# Patient Record
Sex: Male | Born: 1955 | Race: Black or African American | Hispanic: No | Marital: Married | Smoking: Former smoker
Health system: Southern US, Community
[De-identification: ages and names within clinical notes are randomized; demographics above are authoritative.]

## PROBLEM LIST (undated history)

## (undated) DIAGNOSIS — I10 Essential (primary) hypertension: Secondary | ICD-10-CM

---

## 2006-01-20 ENCOUNTER — Ambulatory Visit: Payer: Self-pay | Admitting: Unknown Physician Specialty

## 2007-01-23 ENCOUNTER — Ambulatory Visit: Payer: Self-pay | Admitting: Gastroenterology

## 2007-08-29 ENCOUNTER — Ambulatory Visit: Payer: Self-pay | Admitting: Family Medicine

## 2007-08-29 IMAGING — US US RENAL KIDNEY
1 series · 14 of 25 positions shown · non-contrast
Comparison: none

REASON FOR EXAM: Left flank pain, hematuria
COMMENTS:

[Series 1: us renal kidney · 0.33mm/px · 14 of 28 slices shown]
[im 1/28]
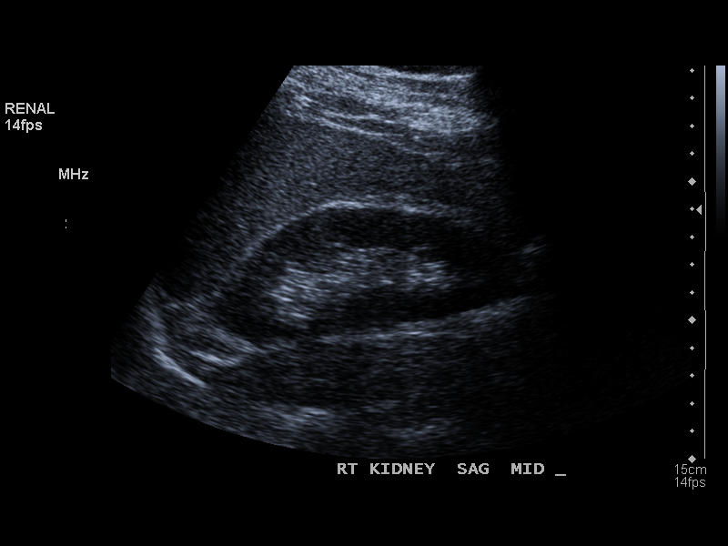
[im 3/28]
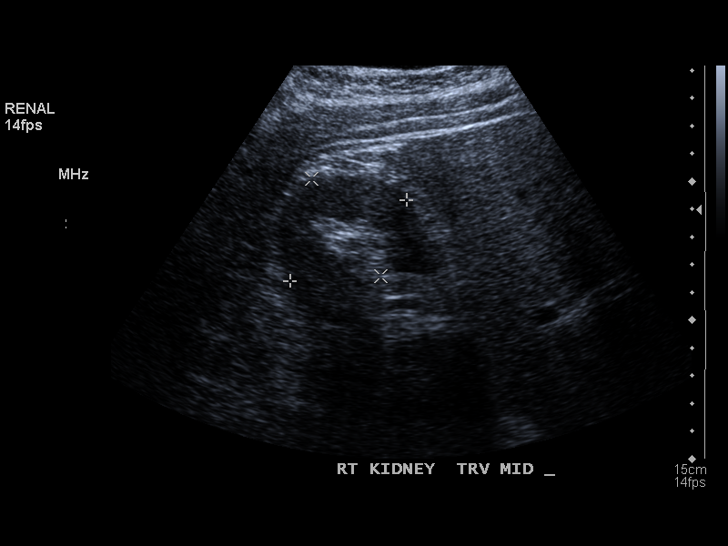
[im 5/28]
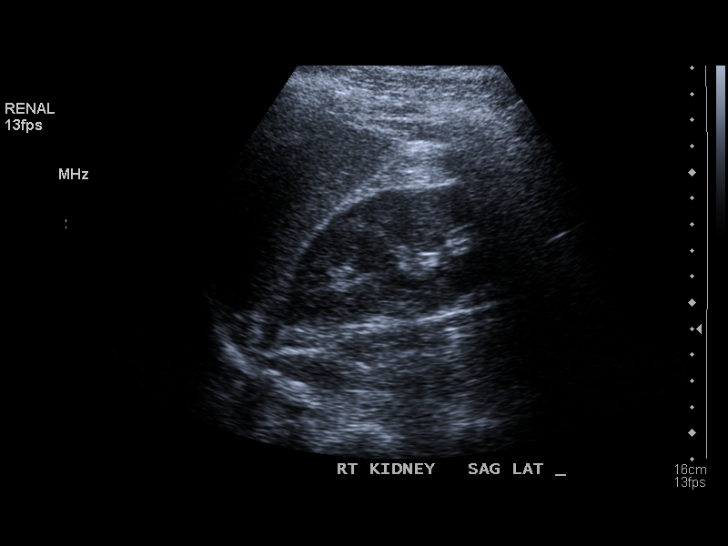
[im 7/28]
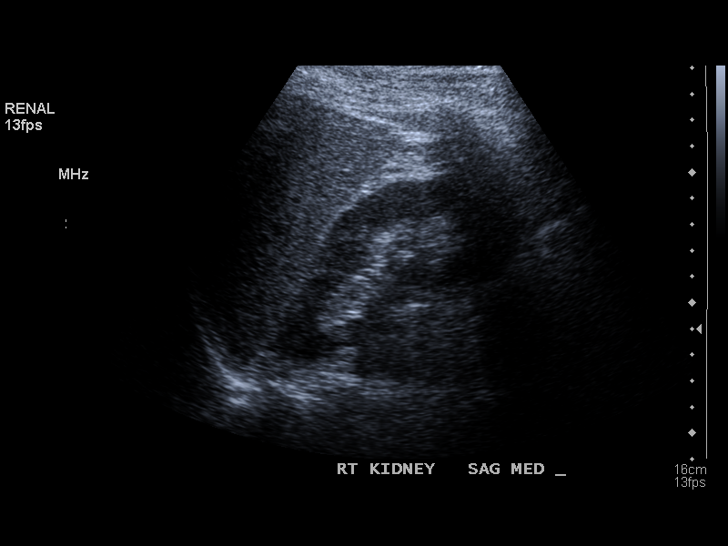
[im 10/28]
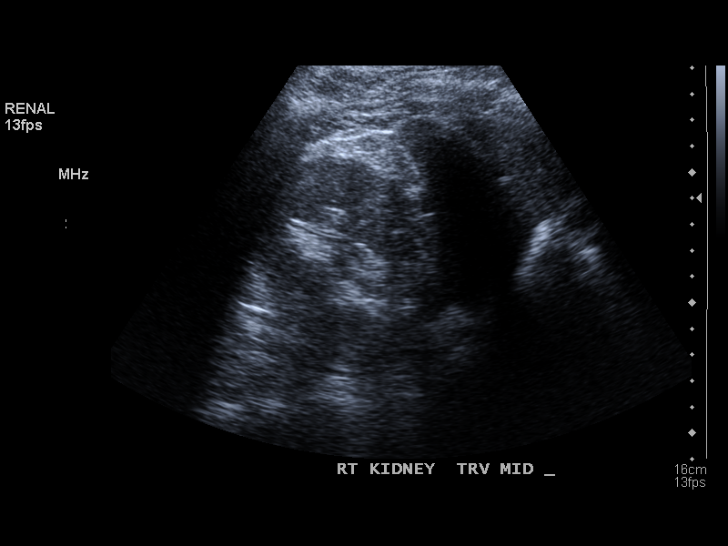
[im 11/28]
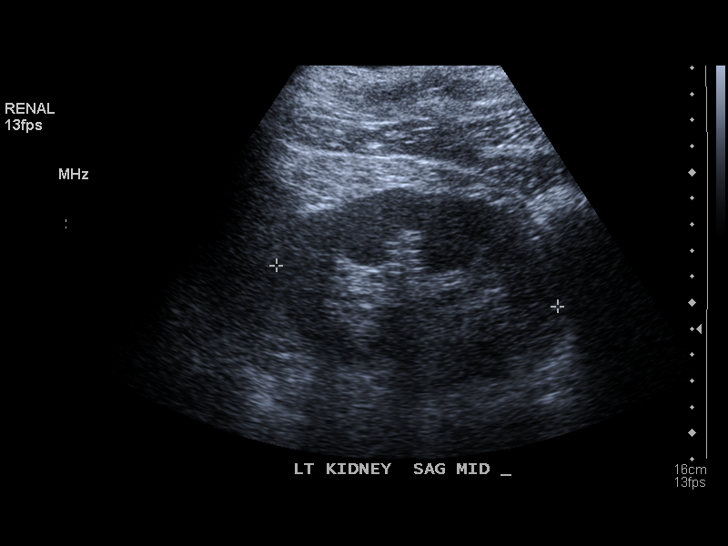
[im 13/28]
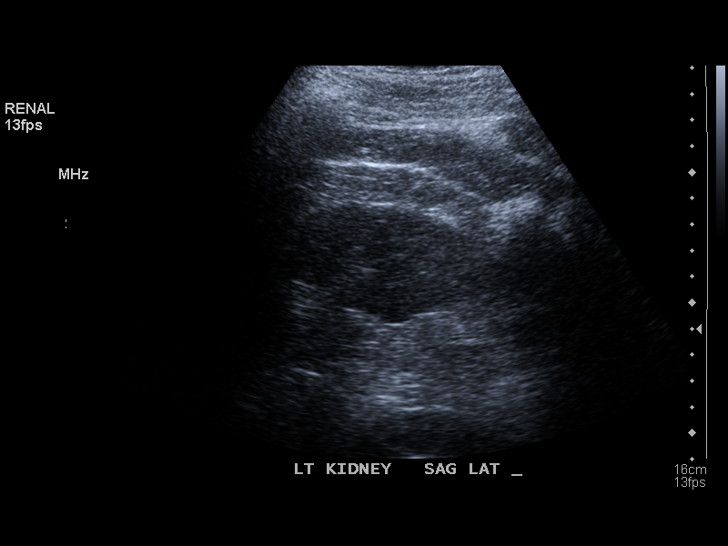
[im 15/28]
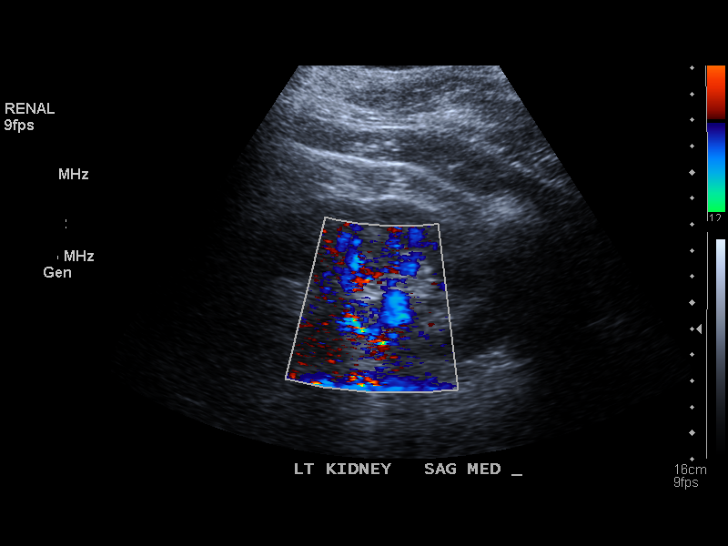
[im 17/28]
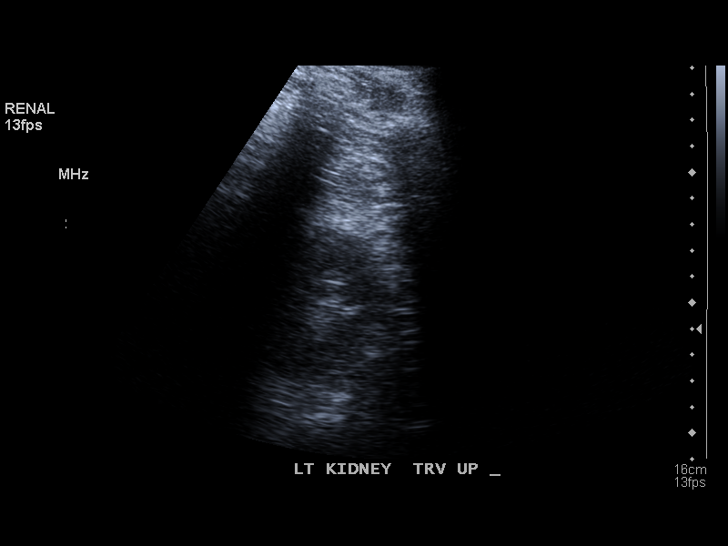
[im 19/28]
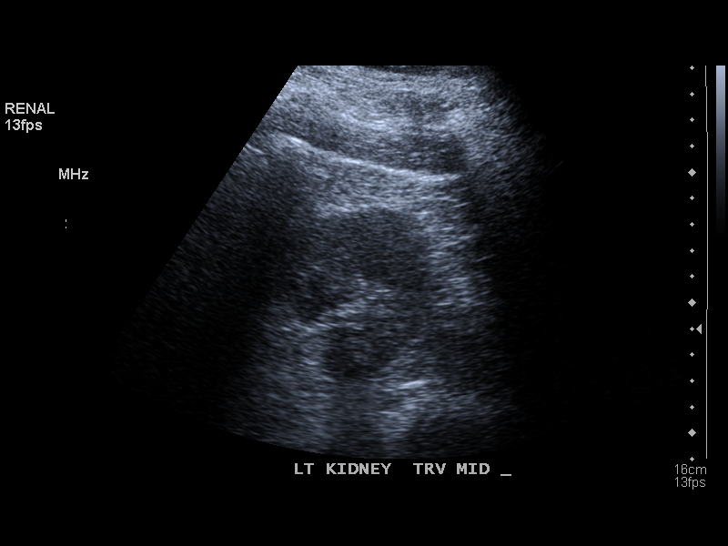
[im 21/28]
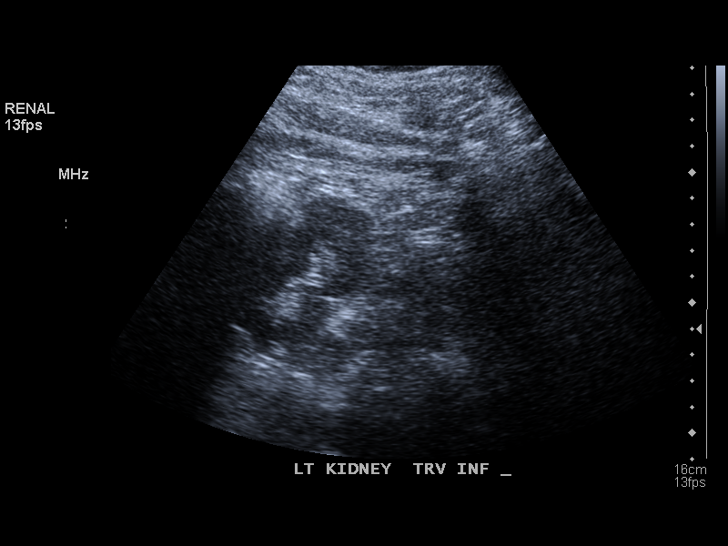
[im 23/28]
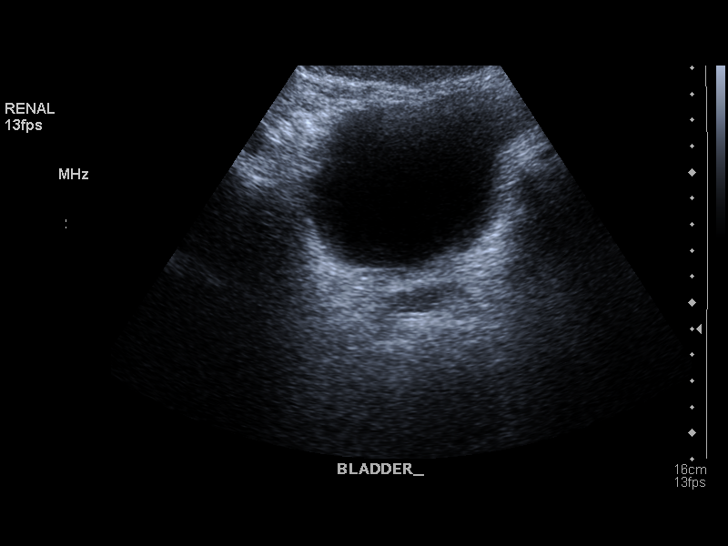
[im 25/28]
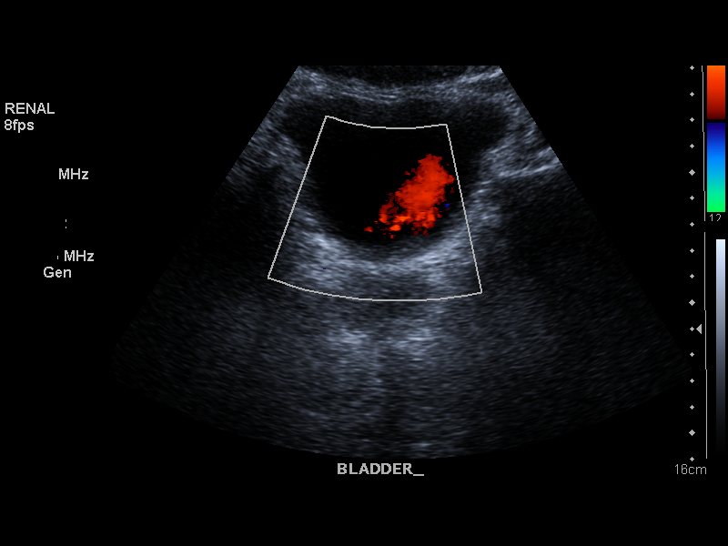
[im 28/28]
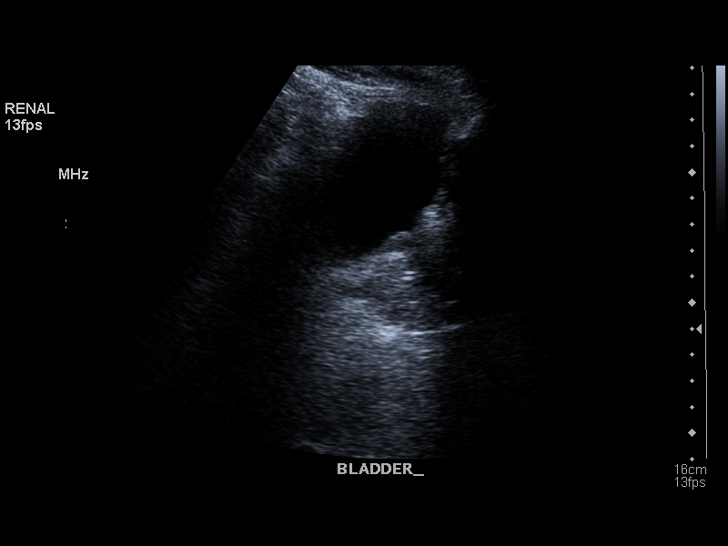

[14 of 25 positions shown; findings below may reference images not displayed]

PROCEDURE:     US  - US KIDNEY  - [DATE]  [DATE]

RESULT:     The RIGHT kidney measures 10.06 cm x 5.12 cm x 4.31 cm. The LEFT
kidney measures 10.9 cm x 4.77 cm x 4.77 cm. The renal cortical margins are
smooth. No solid renal mass lesions are seen. No renal calcifications are
identified. There is no hydronephrosis. The urinary bladder is normal in
appearance.
IMPRESSION: No significant abnormalities are noted.

## 2008-02-12 ENCOUNTER — Ambulatory Visit: Payer: Self-pay | Admitting: Gastroenterology

## 2011-02-24 ENCOUNTER — Inpatient Hospital Stay: Payer: Self-pay | Admitting: Internal Medicine

## 2011-02-24 IMAGING — NM NUCLEAR MEDICINE GASTROINTESTINAL BLEEDING STUDY
2 series · 8 of 8 positions shown · non-contrast
Comparison: none

REASON FOR EXAM: gI bleed, bright blood
COMMENTS:

[Series 1000: gi bleed · 4.80mm/px · 6 of 250 frames shown]
[frame 21/250]
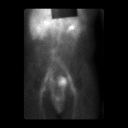
[frame 63/250]
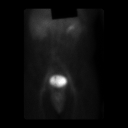
[frame 105/250]
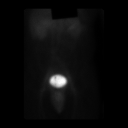
[frame 146/250]
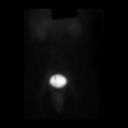
[frame 188/250]
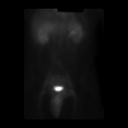
[frame 230/250]
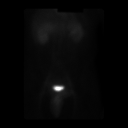

[Series 1000: gi bleed static · 2.40mm/px · 2 of 2 frames shown]
[frame 1/2]
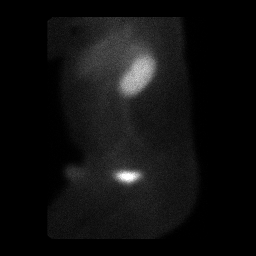
[frame 2/2]
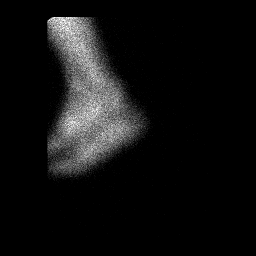

[8 of 8 positions shown; findings below may reference images not displayed]

PROCEDURE:     NM  - NM GI BLOOD LOSS STUDY  - [DATE]  [DATE]

RESULT:     Following intravenous administration of 23.7 mCi technetium 99m
pertechnetate and 3.0 mL PYP, serial views of the abdomen were obtained up
to one hour. There is observed a normal distribution of tracer activity.
Specifically no abnormal areas of increased tracer activity indicative of a
GI bleed are seen.
IMPRESSION: 1. Normal study. No GI bleed is identified.

## 2011-02-24 IMAGING — CR DG ABDOMEN 2V
1 series · 3 of 3 positions shown · non-contrast
Comparison: none

REASON FOR EXAM: pain,
COMMENTS:

PROCEDURE:     DXR - DXR ABDOMEN 2 V FLAT AND ERECT  - [DATE]  [DATE]
RESULT:     No subdiaphragmatic free air is observed. The bowel gas pattern
is normal. No bowel obstruction is seen. No abnormal intra-abdominal
calcifications are noted. The osseous structures are normal in appearance.

[Series 1: view not recorded · 0.17mm/px · 3 of 3 slices shown]
[im 1/3]
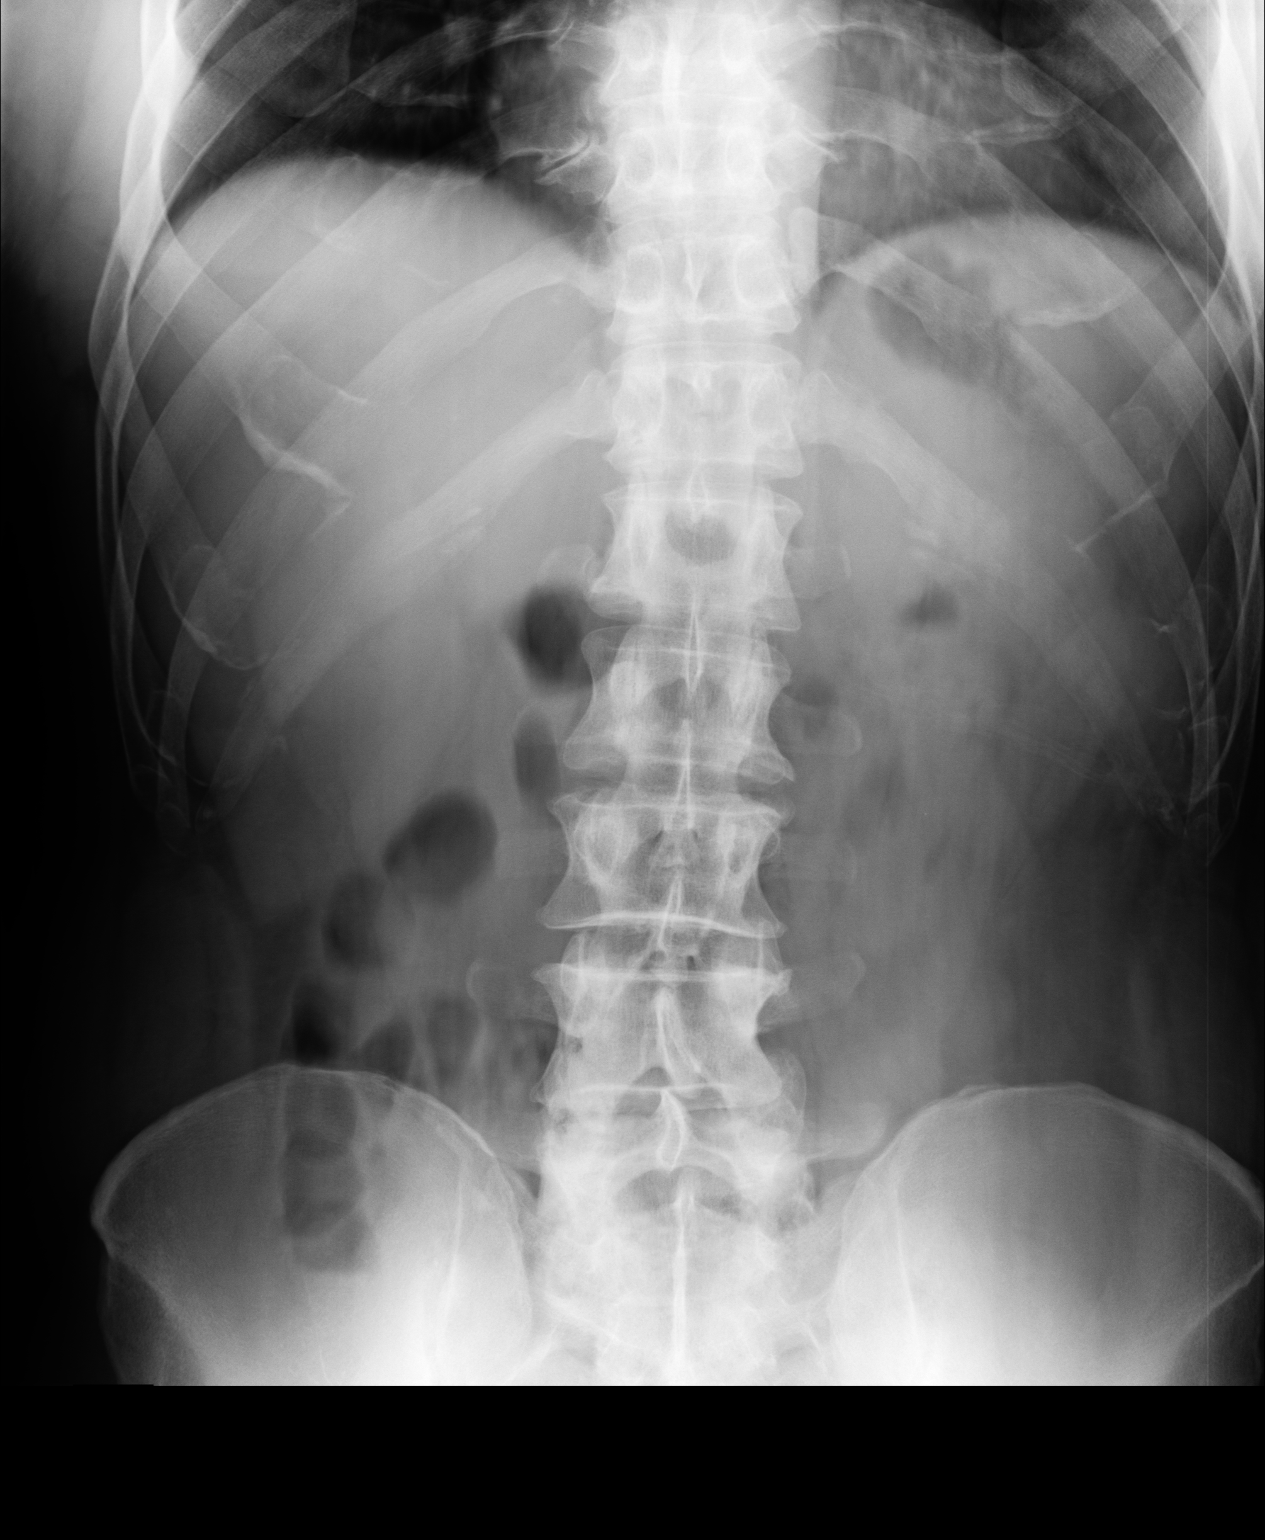
[im 2/3]
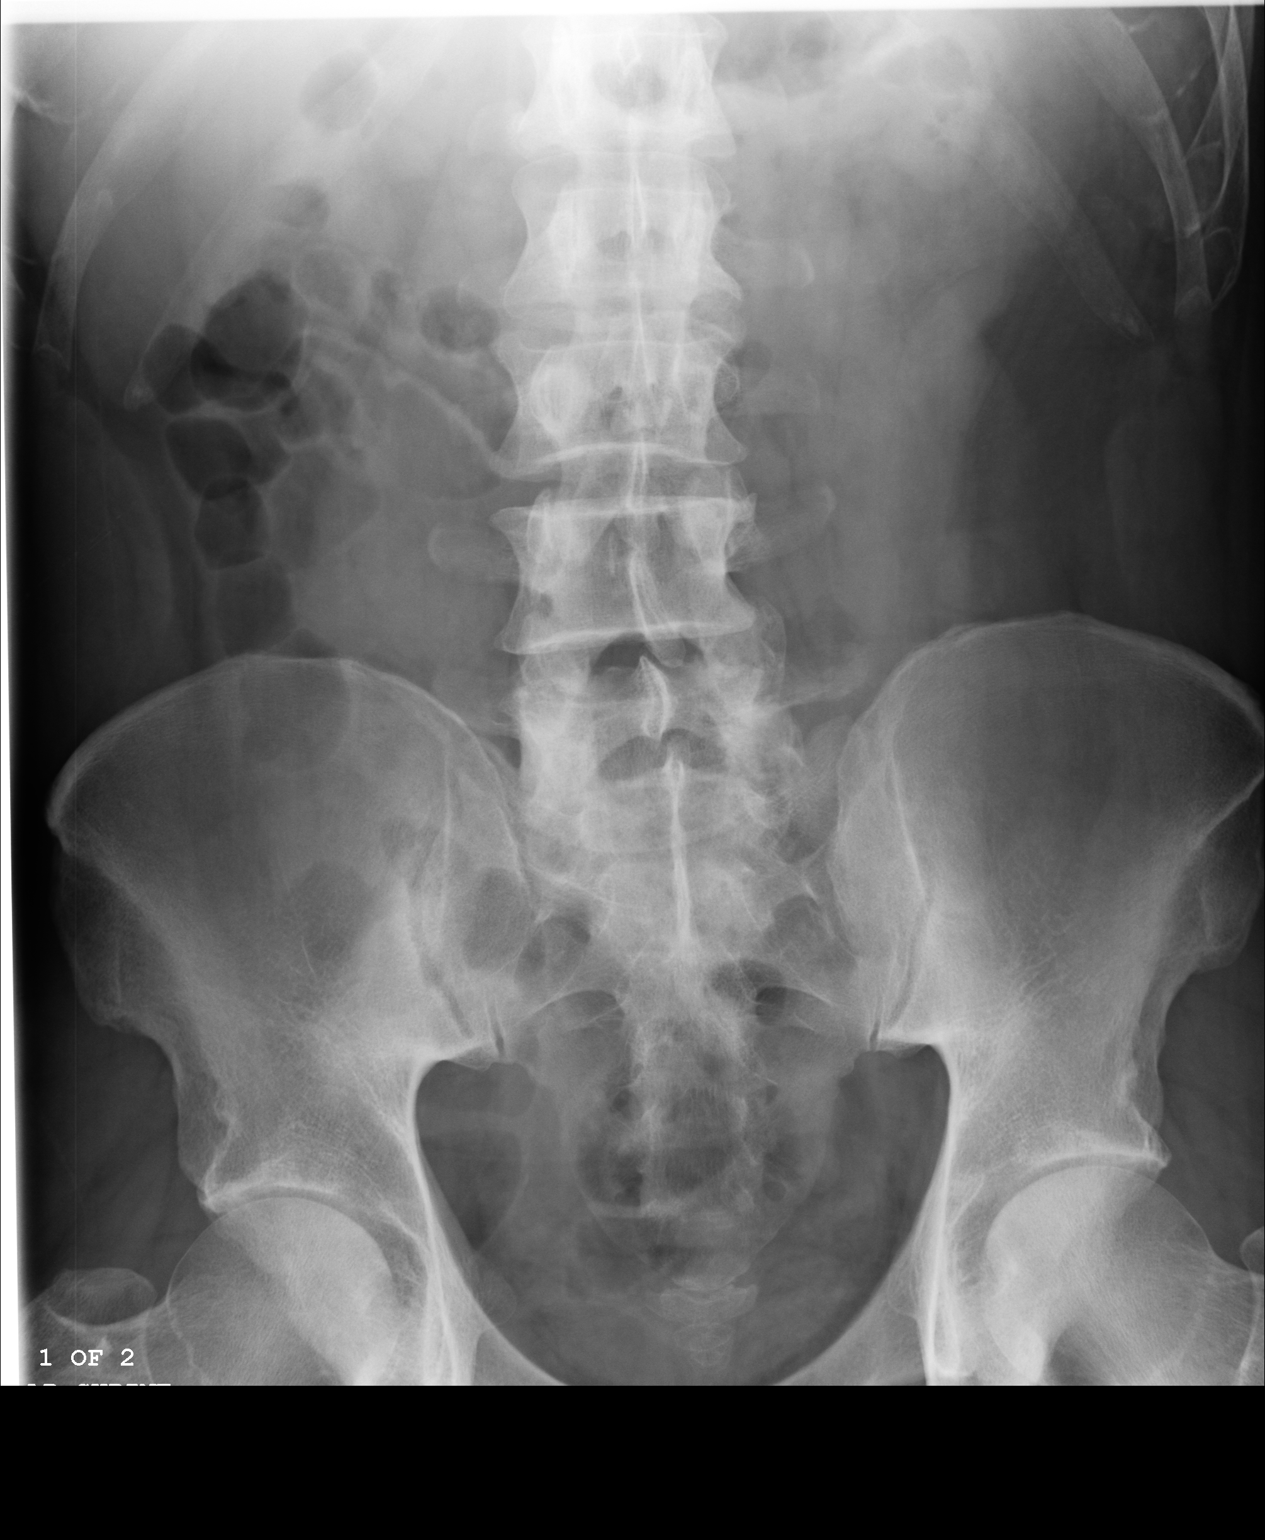
[im 3/3]
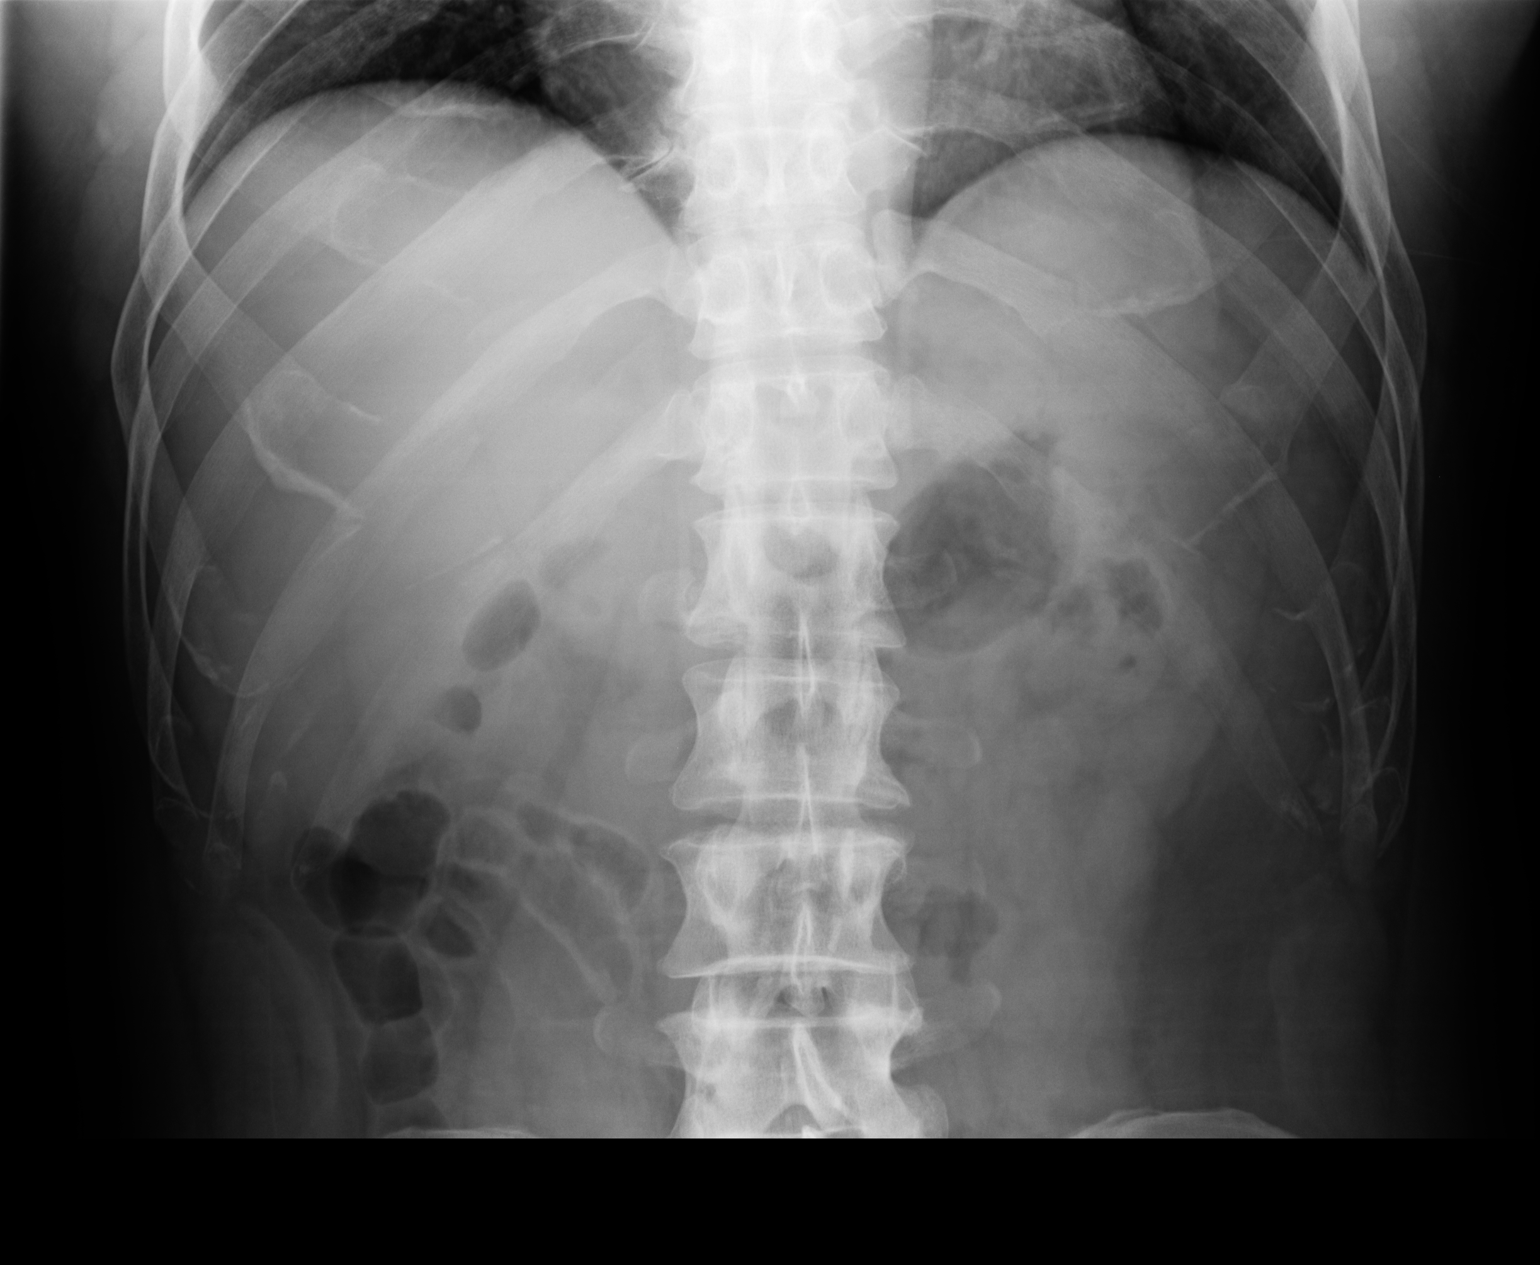

[3 of 3 positions shown; findings below may reference images not displayed]

IMPRESSION: No significant abnormalities are noted.

## 2011-02-24 IMAGING — US US CAROTID DUPLEX BILAT
1 series · 17 of 24 positions shown · non-contrast
Comparison: none

REASON FOR EXAM: syncope
COMMENTS:

[Series 1: us carotid duplex bilat · 17 of 66 slices shown]
[im 1/66]
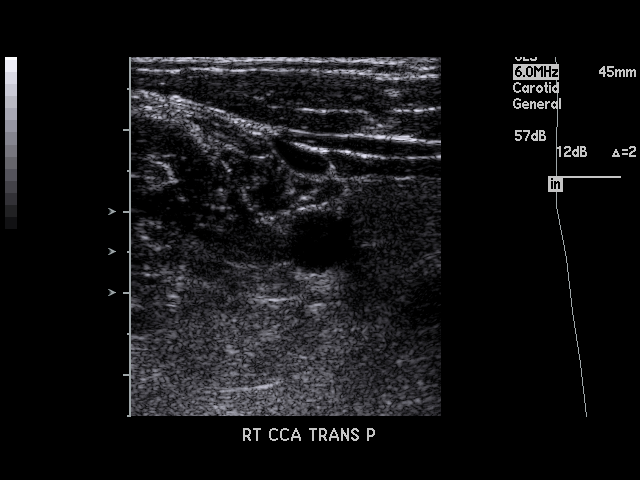
[im 6/66]
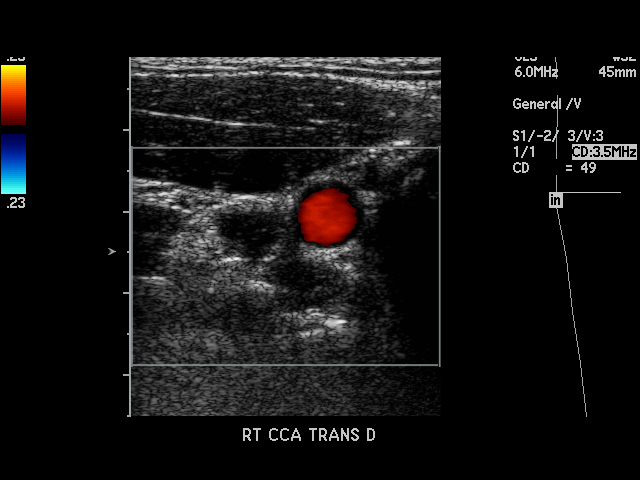
[im 9/66]
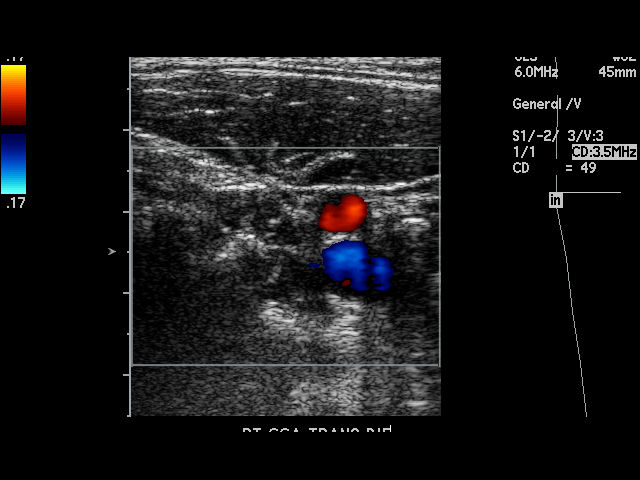
[im 12/66]
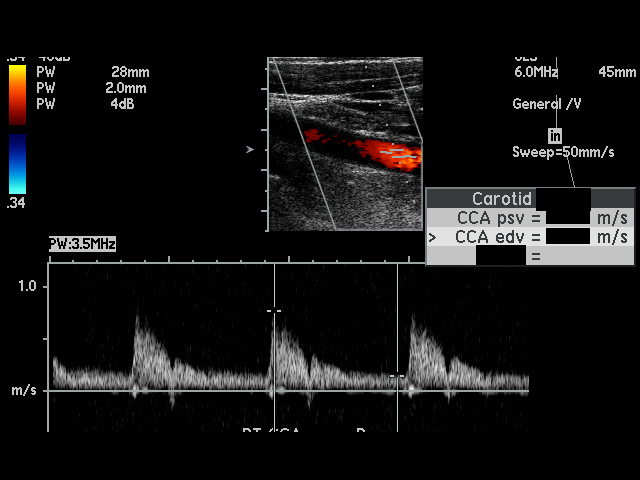
[im 17/66]
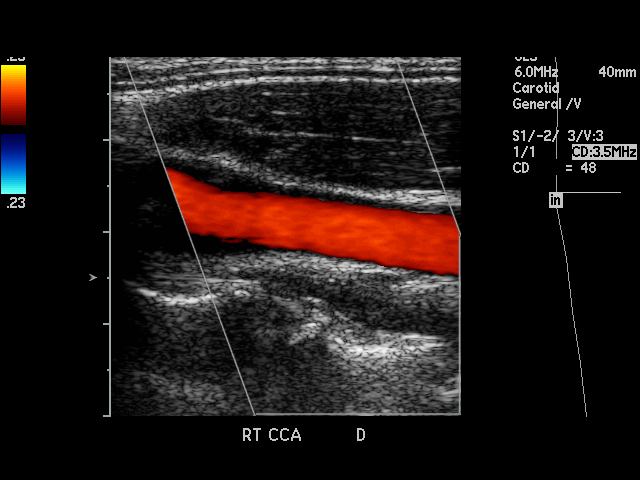
[im 20/66]
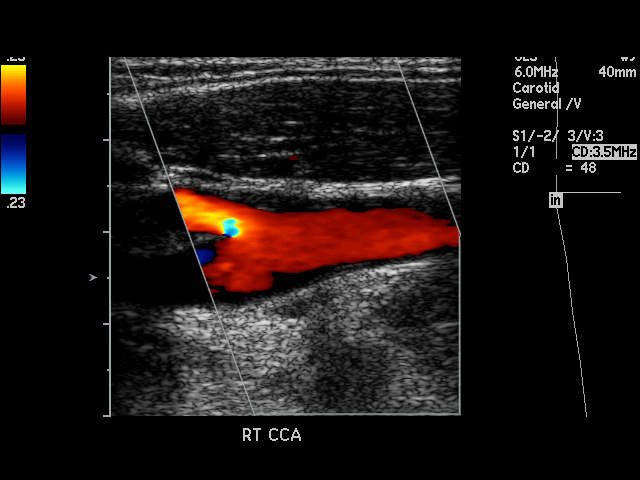
[im 26/66]
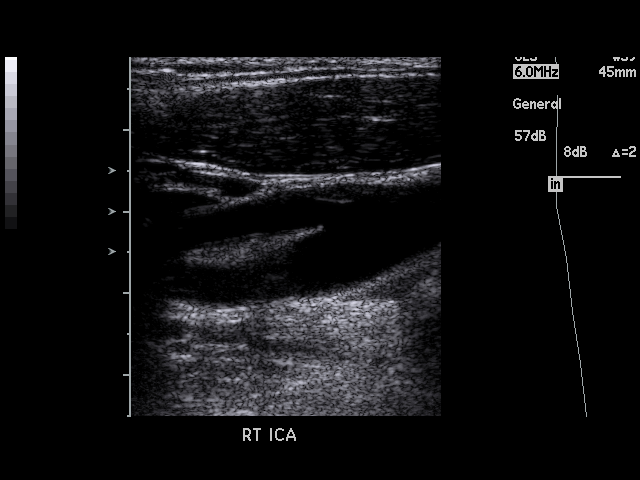
[im 29/66]
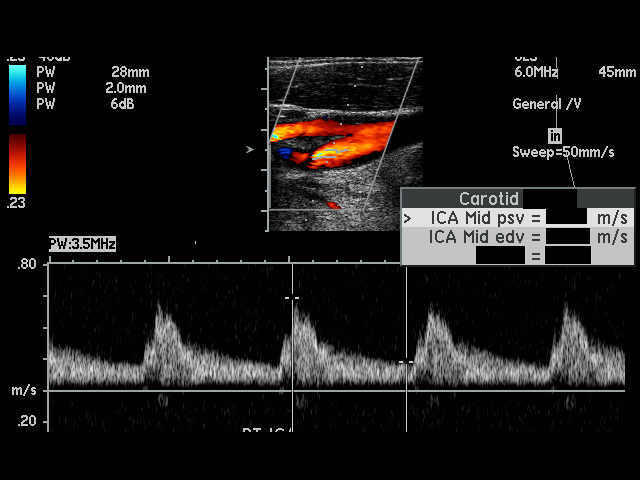
[im 34/66]
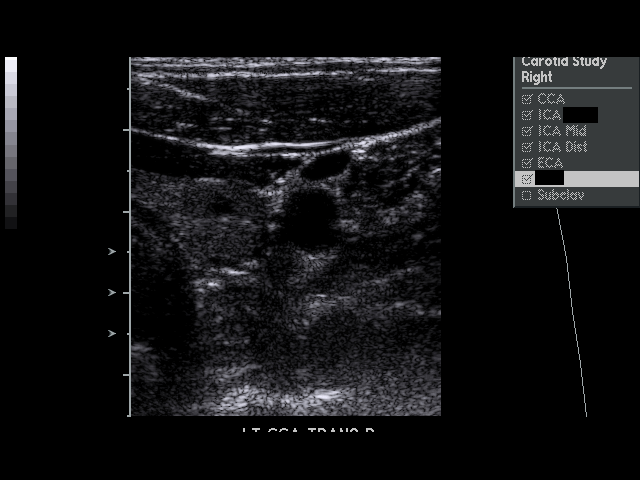
[im 37/66]
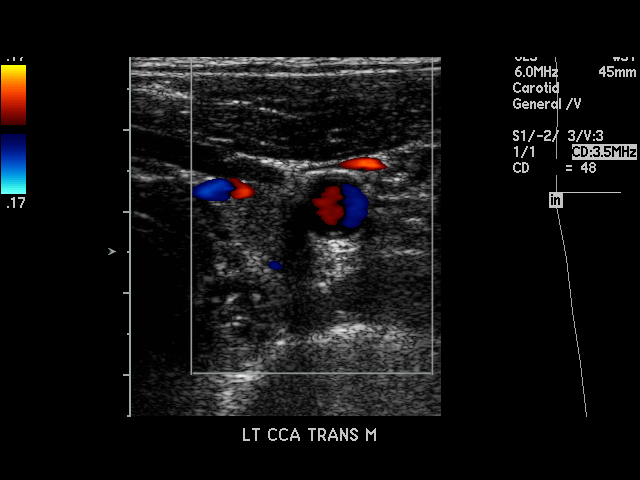
[im 40/66]
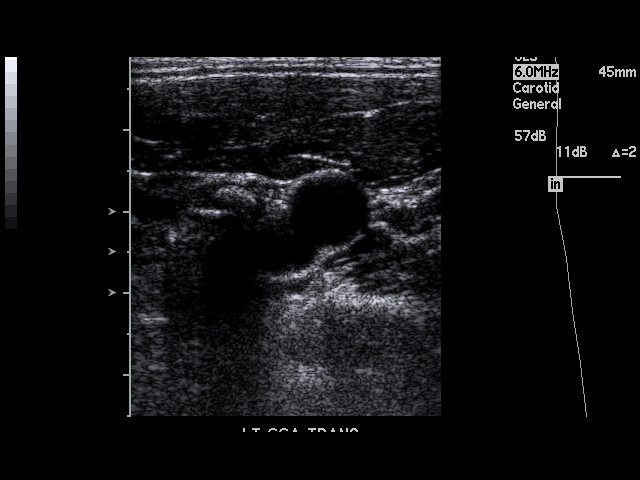
[im 46/66]
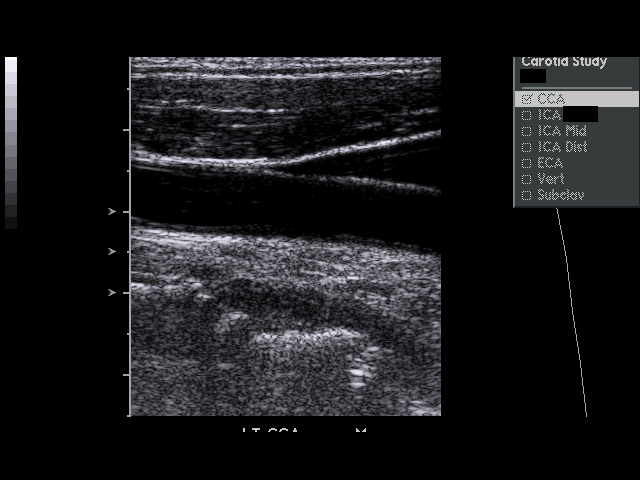
[im 49/66]
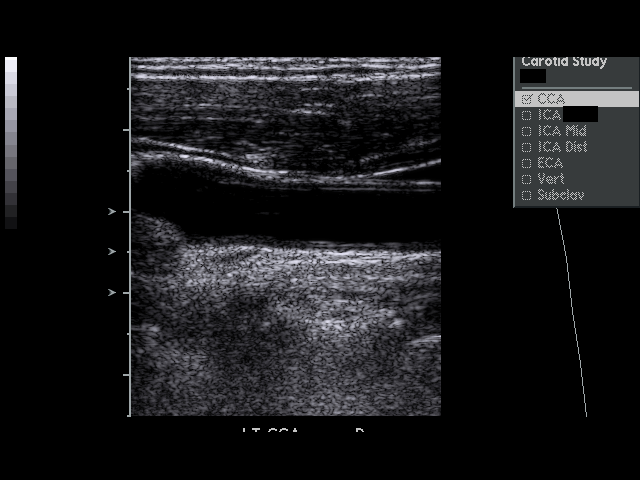
[im 54/66]
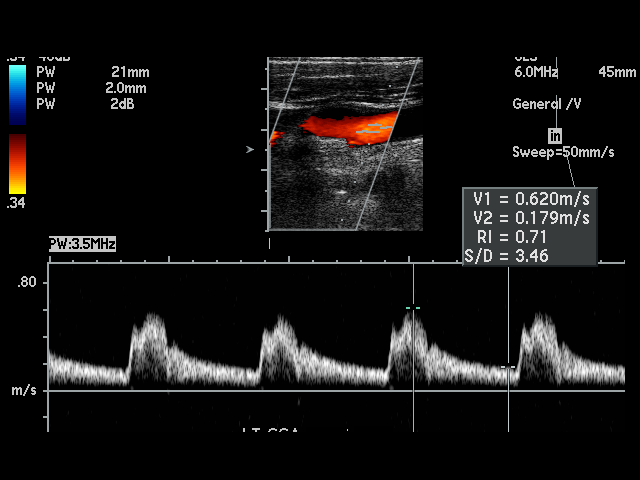
[im 57/66]
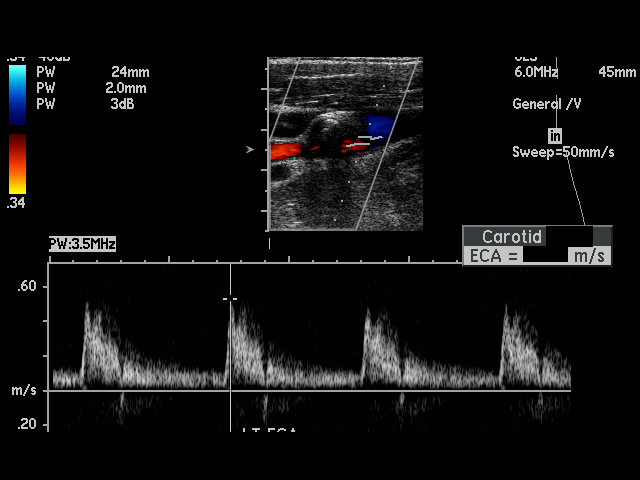
[im 60/66]
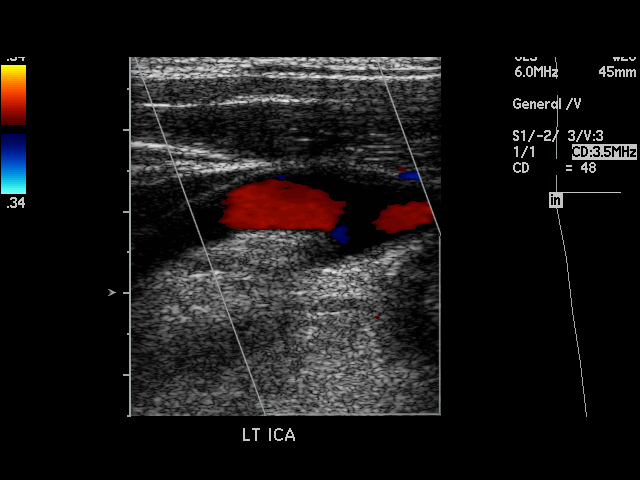
[im 66/66]
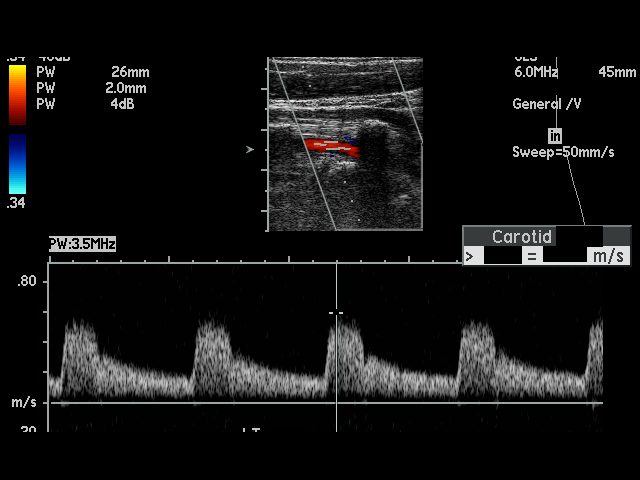

[17 of 24 positions shown; findings below may reference images not displayed]

PROCEDURE:     US  - US CAROTID DOPPLER BILATERAL  - [DATE]  [DATE]

RESULT:     There is noted a small amount of smooth and calcific plaque
formation about the carotid bifurcations bilaterally.

On the right, the peak right common carotid artery flow velocity measures
0.65 m/sec and the peak right internal carotid artery flow velocity measures
0.625 m/sec. The ICA/CCA ratio is 0.962.

On the left, the peak left common carotid artery flow velocity measures
0.635 m/sec and the peak left internal carotid artery flow velocity measures
0.63 m/sec. The ICA/CCA ratio is 0.992.

These values bilaterally are in the normal range and are consistent with the
absence of hemodynamically significant stenosis.

There is observed antegrade flow in both vertebrals.
IMPRESSION: 1.  There is small amount of plaque formation noted about the carotid
bifurcations bilaterally.
2.  No hemodynamically significant stenosis is observed on either side.
3.  There is antegrade flow in both vertebrals.

## 2011-02-25 IMAGING — CT CT ABD-PELV W/ CM
1 of 2 series · 15 of 32 positions shown, 19 images · non-contrast
Comparison: none

REASON FOR EXAM: (1) Colitis; (2) Diarrhea
COMMENTS:

PROCEDURE:     CT  - CT ABDOMEN / PELVIS  W  - [DATE]  [DATE]
RESULT:
TECHNIQUE: Helical 3 mm sections were obtained from the lung bases through
the pubic symphysis status post intravenous administration of 100 ml of
[YY] and oral contrast.

[Series 2: 3mm soft tissue · axial · 0.72mm/px · z∈[-1096,-662]mm · 15 of 165 slices shown, 19 images]
[im 13/165  soft-tissue]
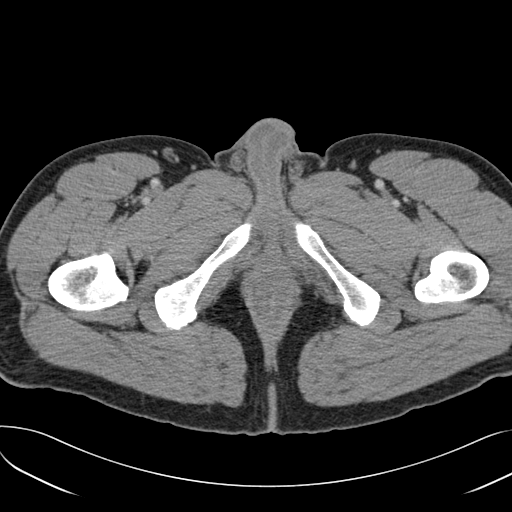
[im 13/165  bone]
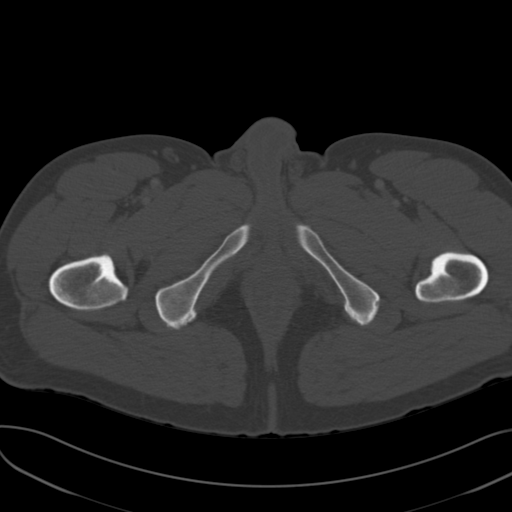
[im 25/165  soft-tissue]
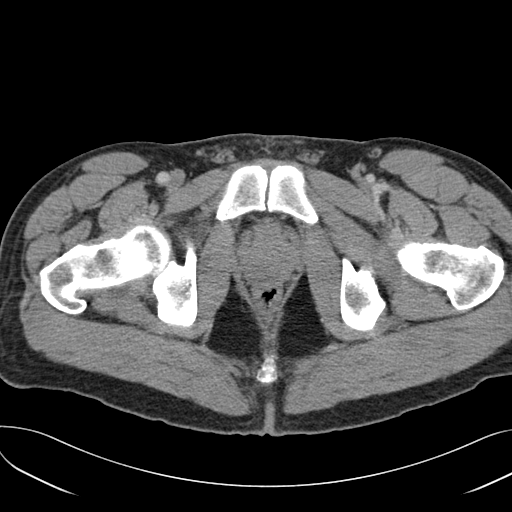
[im 37/165  soft-tissue]
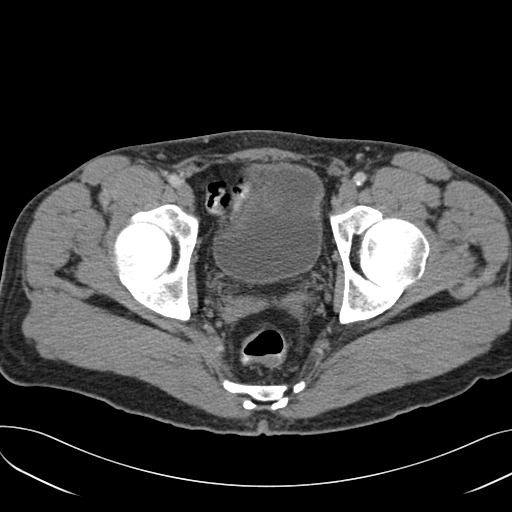
[im 49/165  soft-tissue]
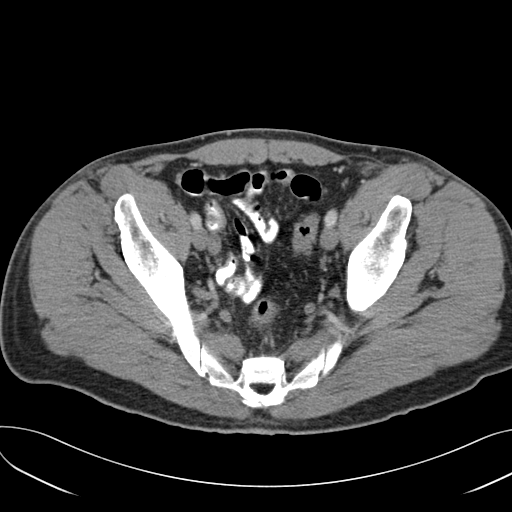
[im 61/165  soft-tissue]
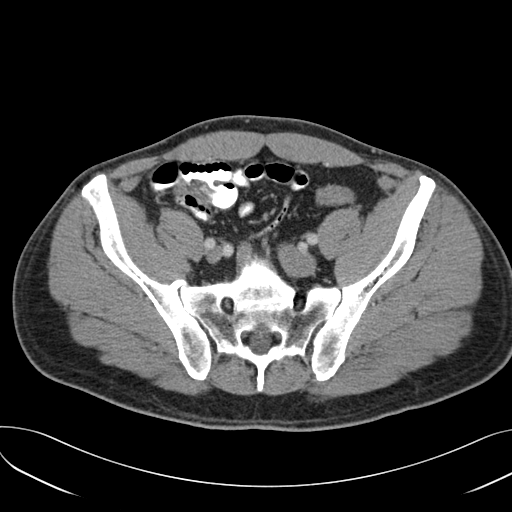
[im 73/165  soft-tissue]
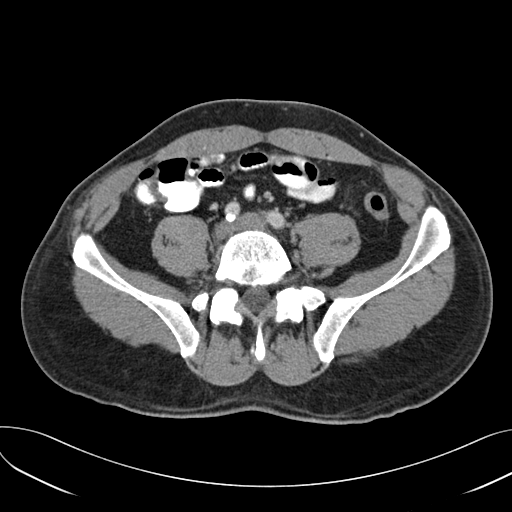
[im 86/165  soft-tissue]
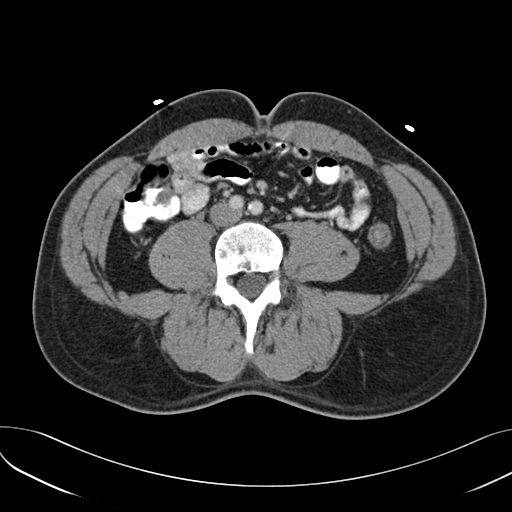
[im 98/165  soft-tissue]
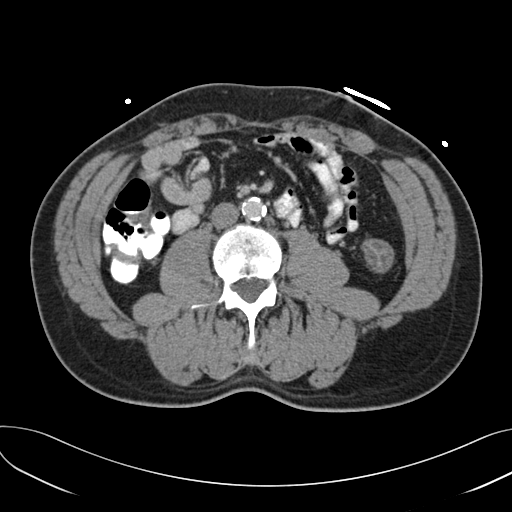
[im 110/165  soft-tissue]
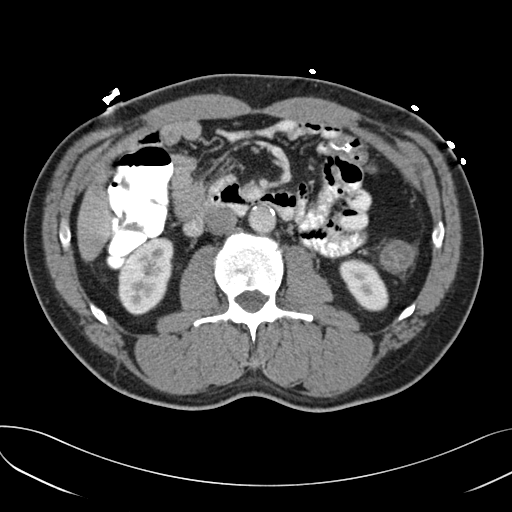
[im 110/165  bone]
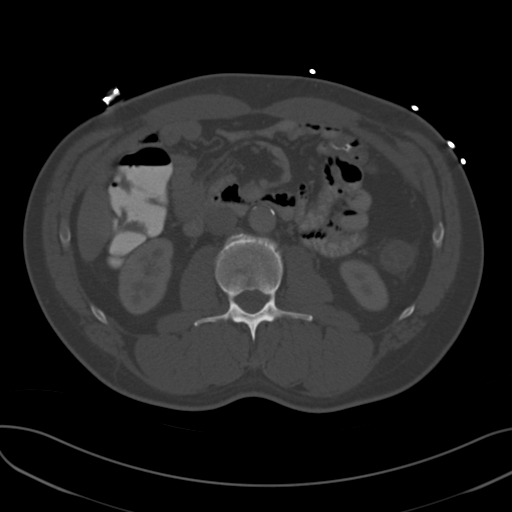
[im 122/165  soft-tissue]
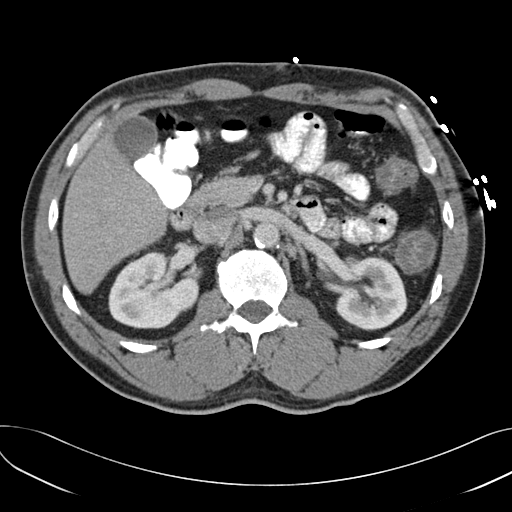
[im 134/165  soft-tissue]
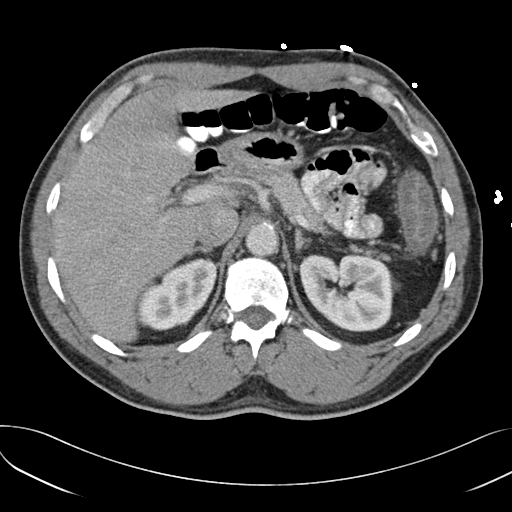
[im 140/165  lung]
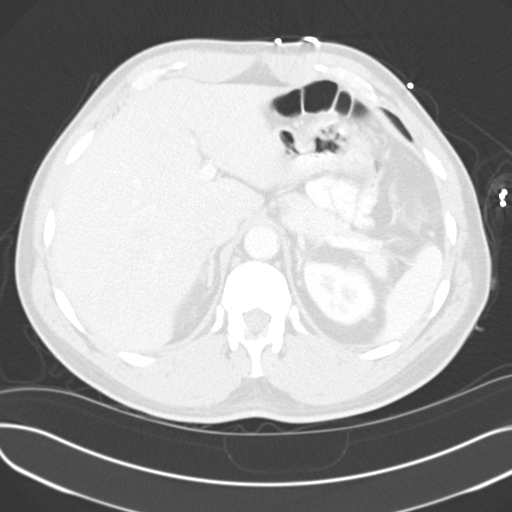
[im 146/165  soft-tissue]
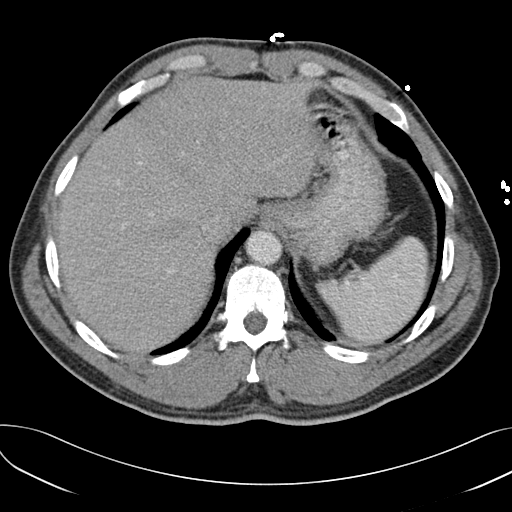
[im 146/165  lung]
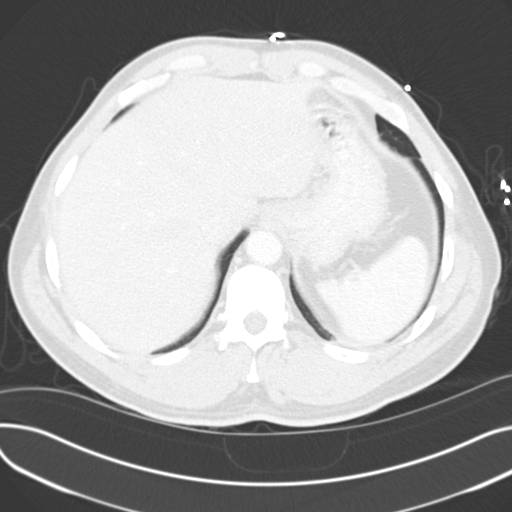
[im 152/165  lung]
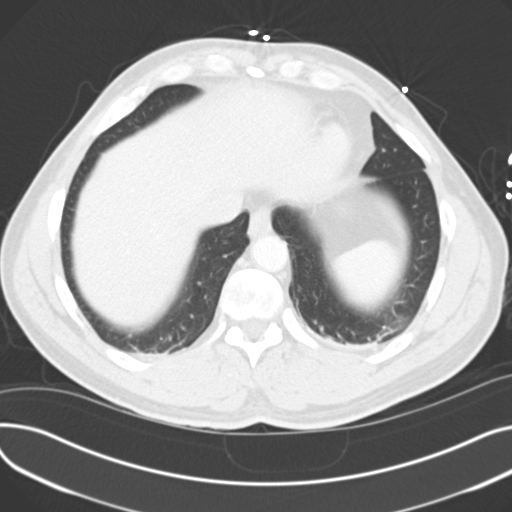
[im 158/165  soft-tissue]
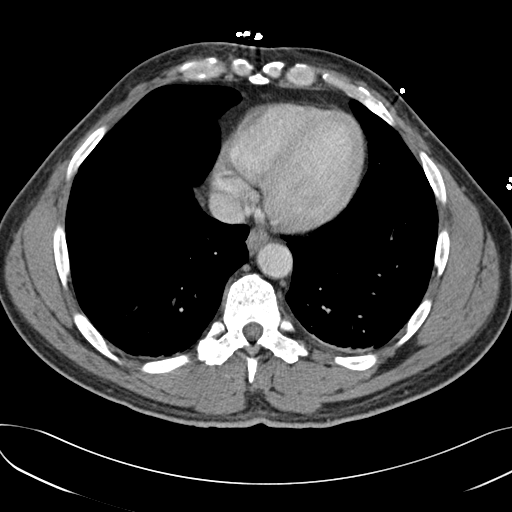
[im 158/165  lung]
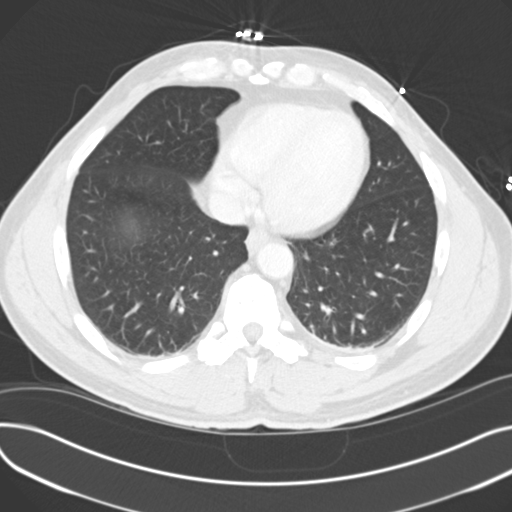

[15 of 32 positions shown; findings below may reference images not displayed]

FINDINGS: The lung bases demonstrate hypoventilation.

The liver, spleen, adrenals, pancreas, and kidneys are unremarkable.

Evaluation of the descending colon demonstrates diffuse bowel wall
thickening extending from the splenic flexure region to the distal
descending colon. There is stranding within the surrounding mesenteric fat.
No free fluid or loculated fluid collections are identified. There is no
evidence of bowel obstruction. There is no evidence of drainable loculated
fluid collections or free fluid. There is no evidence of an abdominal aortic
aneurysm. The celiac, SMA, IMA, portal vein, and SMV are opacified. There is
no evidence of enteritis or appendicitis.
IMPRESSION: Diffuse inflammatory changes within the descending colon
consistent with colitis. Differential considerations are infectious versus
inflammatory. The abdominal vasculature appears patent. There are patent
arcade vessels along the course of the descending colon reducing the
likelihood of vascular compromise.

## 2014-01-12 ENCOUNTER — Emergency Department: Payer: Self-pay | Admitting: Emergency Medicine

## 2019-06-20 ENCOUNTER — Telehealth: Payer: Self-pay | Admitting: *Deleted

## 2019-06-20 NOTE — Telephone Encounter (Signed)
Received referral for low dose lung cancer screening CT scan. Message left at phone number listed in EMR for patient to call me back to facilitate scheduling scan.  

## 2019-06-24 ENCOUNTER — Telehealth: Payer: Self-pay | Admitting: *Deleted

## 2019-06-24 NOTE — Telephone Encounter (Signed)
Received referral for low dose lung cancer screening CT scan. Message left at phone number listed in EMR for patient to call me back to facilitate scheduling scan.  

## 2019-07-18 ENCOUNTER — Encounter: Payer: Self-pay | Admitting: *Deleted

## 2019-07-18 ENCOUNTER — Telehealth: Payer: Self-pay | Admitting: *Deleted

## 2019-07-18 DIAGNOSIS — Z87891 Personal history of nicotine dependence: Secondary | ICD-10-CM

## 2019-07-18 NOTE — Telephone Encounter (Signed)
Received referral for initial lung cancer screening scan. Contacted patient and obtained smoking history,(current, 46 pack year) as well as answering questions related to screening process. Patient denies signs of lung cancer such as weight loss or hemoptysis. Patient denies comorbidity that would prevent curative treatment if lung cancer were found. Patient is scheduled for shared decision making visit and CT scan on 07/24/19 at 1030am.

## 2019-07-24 ENCOUNTER — Other Ambulatory Visit: Payer: Self-pay

## 2019-07-24 ENCOUNTER — Inpatient Hospital Stay: Payer: BC Managed Care – PPO | Attending: Oncology | Admitting: Oncology

## 2019-07-24 ENCOUNTER — Ambulatory Visit
Admission: RE | Admit: 2019-07-24 | Discharge: 2019-07-24 | Disposition: A | Discharge status: Home / Self Care | Payer: BC Managed Care – PPO | Source: Ambulatory Visit | Attending: Oncology | Admitting: Oncology

## 2019-07-24 DIAGNOSIS — Z87891 Personal history of nicotine dependence: Secondary | ICD-10-CM

## 2019-07-24 DIAGNOSIS — F17210 Nicotine dependence, cigarettes, uncomplicated: Secondary | ICD-10-CM

## 2019-07-24 DIAGNOSIS — Z122 Encounter for screening for malignant neoplasm of respiratory organs: Secondary | ICD-10-CM

## 2019-07-24 IMAGING — CT CT CHEST LUNG CANCER SCREENING LOW DOSE W/O CM
2 of 5 series · 15 of 40 positions shown, 18 images · non-contrast
Comparison: None.

CLINICAL DATA: 63-year-old male current smoker, with 46 pack-year
history of smoking, for initial lung cancer screening

EXAM:
CT CHEST WITHOUT CONTRAST LOW-DOSE FOR LUNG CANCER SCREENING
TECHNIQUE: Multidetector CT imaging of the chest was performed following the
standard protocol without IV contrast.

[Series 3: lung 1.00 · axial · 0.63mm/px · z∈[-1233,-892]mm · 12 of 377 slices shown, 15 images]
[im 18/377  mediastinal]
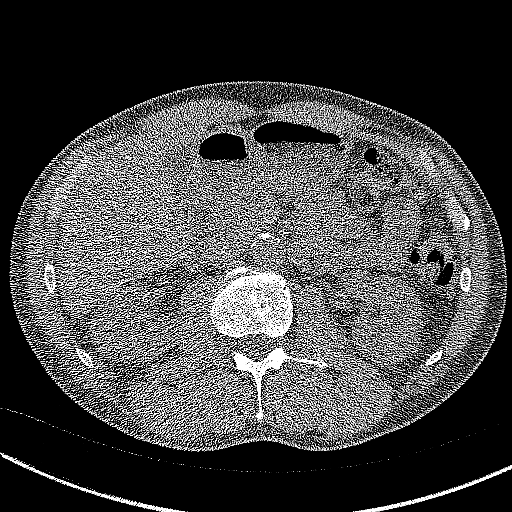
[im 18/377  lung]
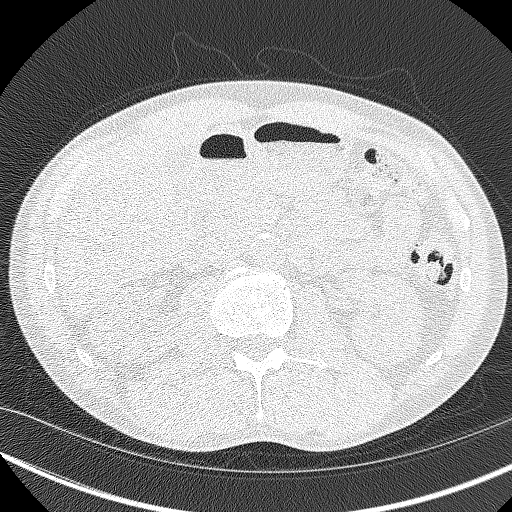
[im 54/377  lung]
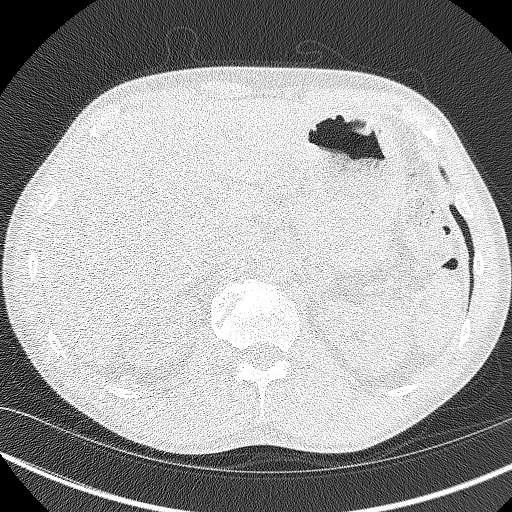
[im 90/377  lung]
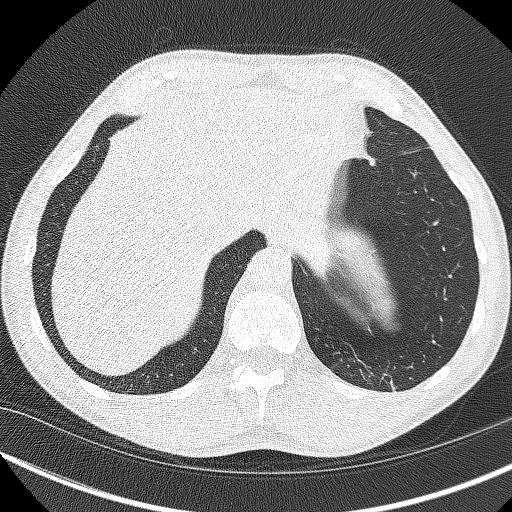
[im 108/377  lung]
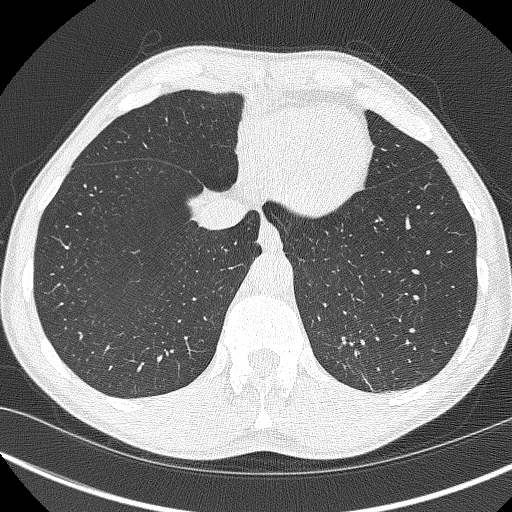
[im 144/377  mediastinal]
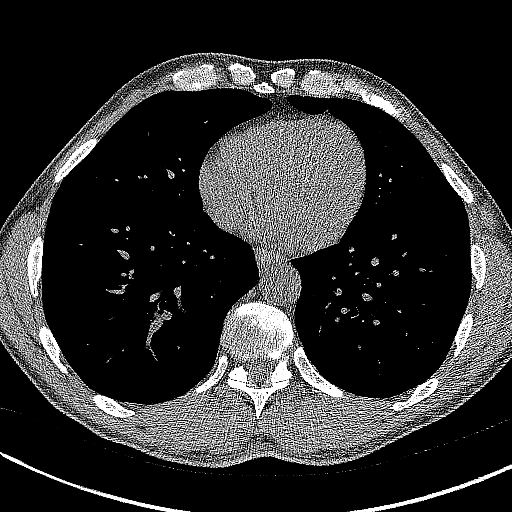
[im 144/377  lung]
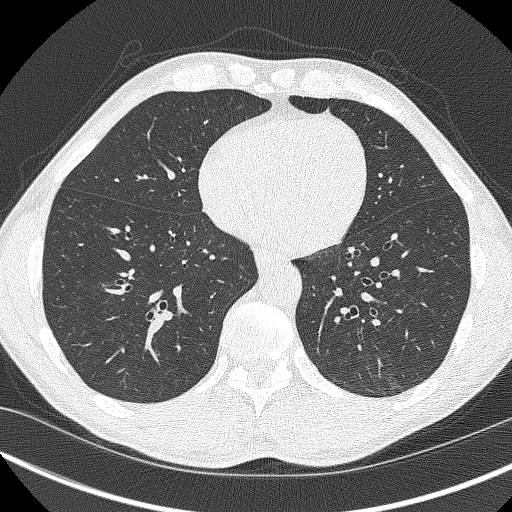
[im 180/377  lung]
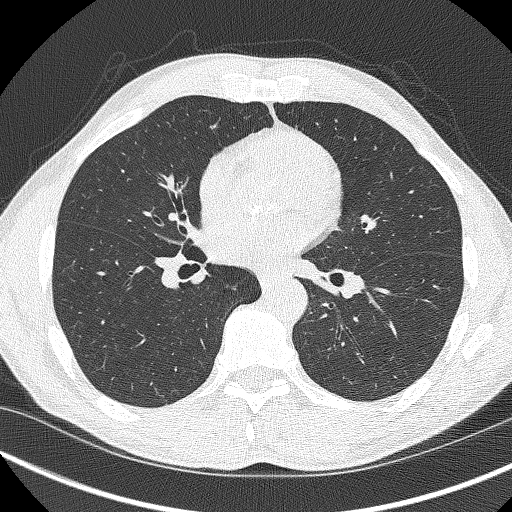
[im 197/377  lung]
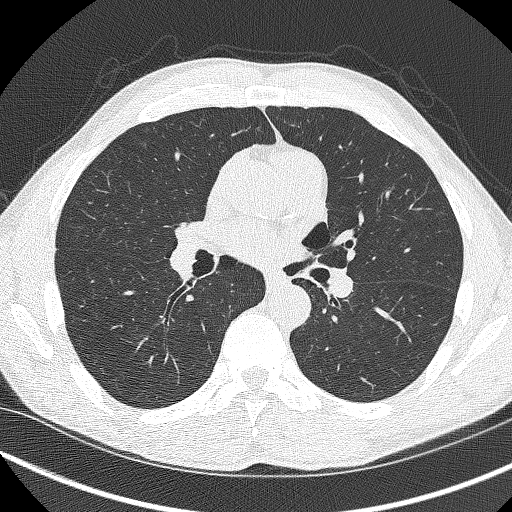
[im 233/377  lung]
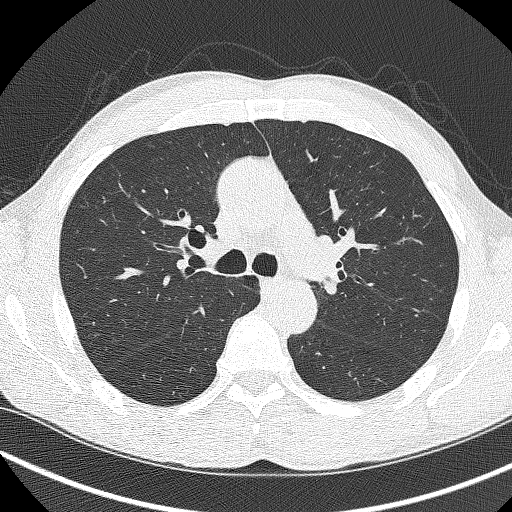
[im 269/377  mediastinal]
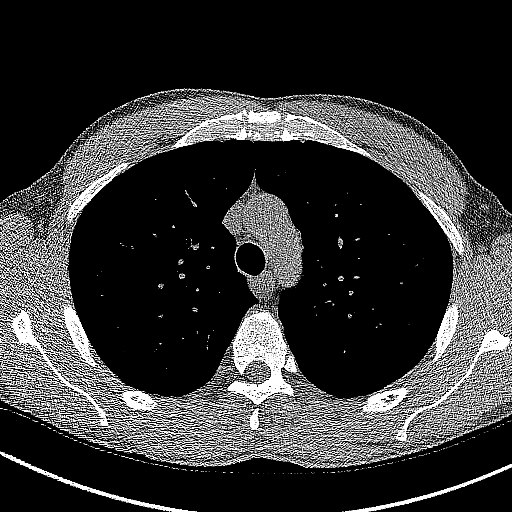
[im 269/377  lung]
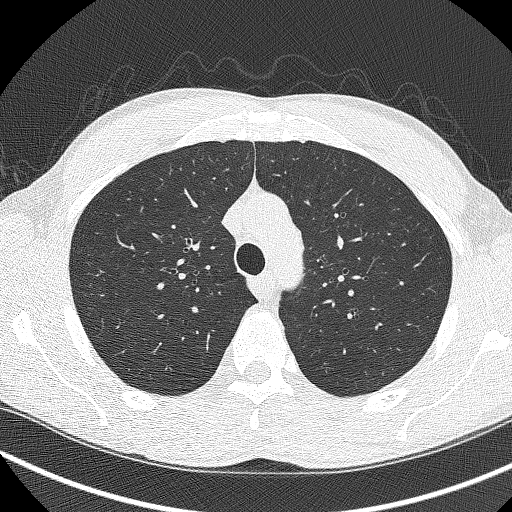
[im 287/377  lung]
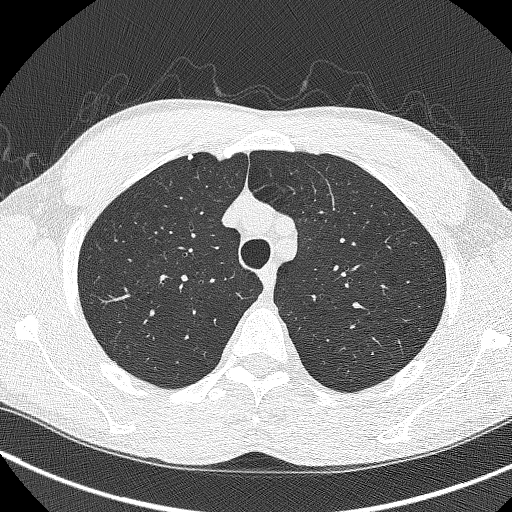
[im 323/377  lung]
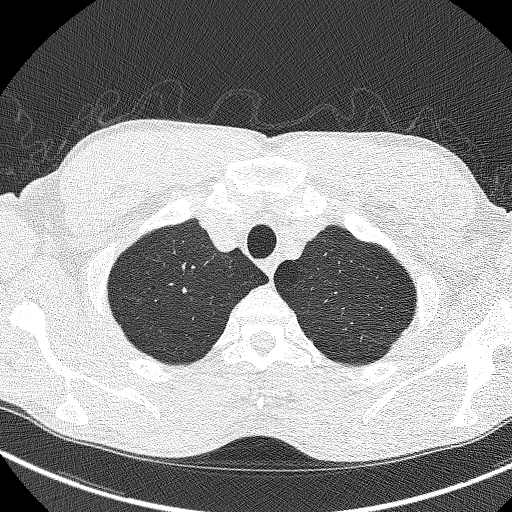
[im 359/377  lung]
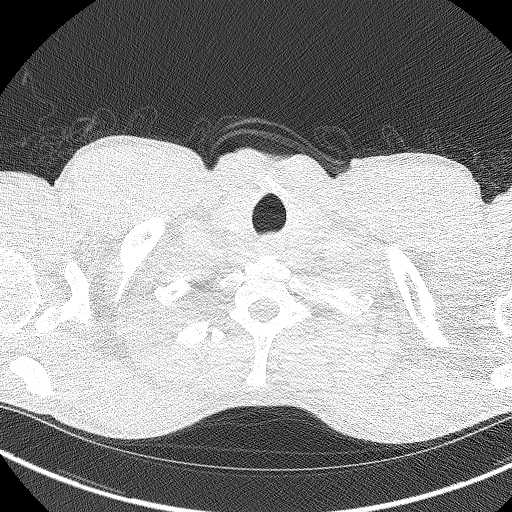

[Series 4: coronals lung 1.00 cor · coronal · 0.63mm/px · 3 of 275 slices shown]
[im 55/275  lung]
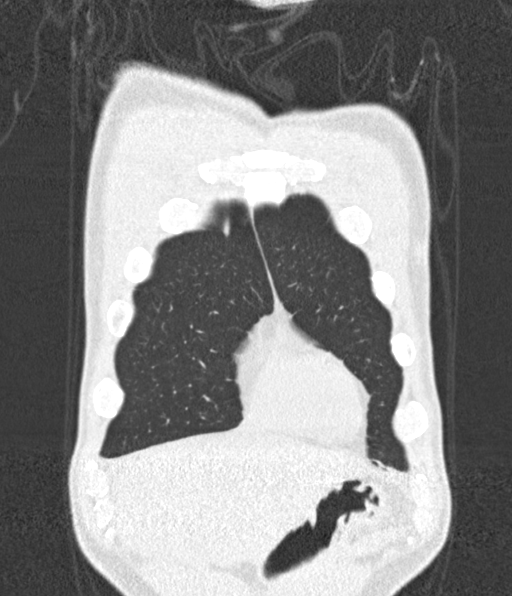
[im 110/275  lung]
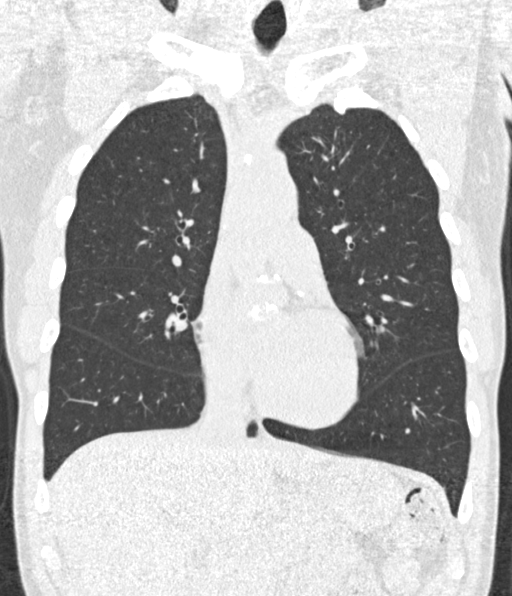
[im 165/275  lung]
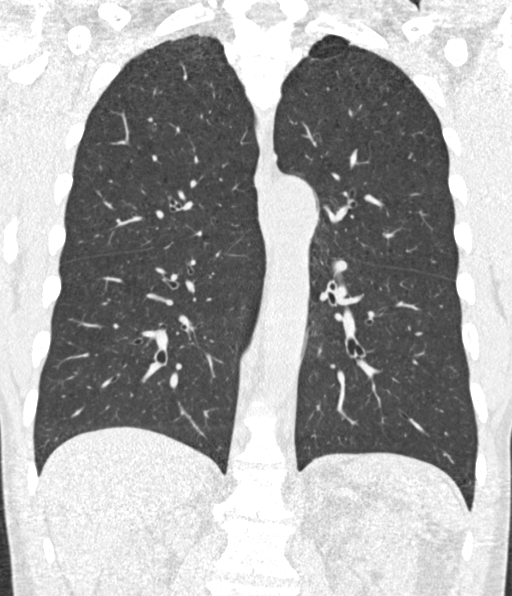

[15 of 40 positions shown; findings below may reference images not displayed]

FINDINGS: Cardiovascular: Heart is normal in size. No pericardial effusion.

No evidence of thoracic aortic aneurysm. Atherosclerotic
calcifications of the aortic root/arch.

Coronary atherosclerosis of the LAD.

Mediastinum/Nodes: No suspicious mediastinal lymphadenopathy.

Visualized thyroid is unremarkable.

Lungs/Pleura: Mild biapical pleural-parenchymal scarring.

Mild centrilobular and paraseptal emphysematous changes, upper lung
predominant.

No focal consolidation.

3.3 mm subpleural calcified granuloma in the anterior right upper
lobe, benign. No suspicious pulmonary nodules.

No pleural effusion or pneumothorax.

Upper Abdomen: Visualized upper abdomen is grossly unremarkable,
noting vascular calcifications.

Musculoskeletal: Mild degenerative changes of the upper lumbar
spine.
IMPRESSION: Lung-RADS 1, negative. Continue annual screening with low-dose chest
CT without contrast in 12 months.

Aortic Atherosclerosis ([7D]-[7D]) and Emphysema ([7D]-[7D]).

## 2019-07-24 NOTE — Progress Notes (Signed)
Virtual Visit via Video Note  I connected with [name] on 07/24/19 at 10:30 AM EST by a video enabled telemedicine application and verified that I am speaking with the correct person using two identifiers.  Location: Patient: [facility] Provider: Office   I discussed the limitations of evaluation and management by telemedicine and the availability of in person appointments. The patient expressed understanding and agreed to proceed.  I discussed the assessment and treatment plan with the patient. The patient was provided an opportunity to ask questions and all were answered. The patient agreed with the plan and demonstrated an understanding of the instructions.   The patient was advised to call back or seek an in-person evaluation if the symptoms worsen or if the condition fails to improve as anticipated.   In accordance with CMS guidelines, patient has met eligibility criteria including age, absence of signs or symptoms of lung cancer.  Social History   Tobacco Use  . Smoking status: Current Every Day Smoker    Packs/day: 1.00    Years: 46.00    Pack years: 46.00    Types: Cigarettes  Substance Use Topics  . Alcohol use: Not on file  . Drug use: Not on file      A shared decision-making session was conducted prior to the performance of CT scan. This includes one or more decision aids, includes benefits and harms of screening, follow-up diagnostic testing, over-diagnosis, false positive rate, and total radiation exposure.   Counseling on the importance of adherence to annual lung cancer LDCT screening, impact of co-morbidities, and ability or willingness to undergo diagnosis and treatment is imperative for compliance of the program.   Counseling on the importance of continued smoking cessation for former smokers; the importance of smoking cessation for current smokers, and information about tobacco cessation interventions have been given to patient including [place] and  [employer] quit [place] programs.   Written order for lung cancer screening with LDCT has been given to the patient and any and all questions have been answered to the best of my abilities.    Yearly follow up will be coordinated by Burgess Estelle, Thoracic Navigator.  I provided 15 minutes of face-to-face video visit time during this encounter, and > 50% was spent counseling as documented under my assessment & plan.   Jacquelin Hawking, NP

## 2019-07-25 ENCOUNTER — Telehealth: Payer: Self-pay | Admitting: *Deleted

## 2019-07-25 NOTE — Telephone Encounter (Signed)
Notified patient of LDCT lung cancer screening program results with recommendation for 12 month follow up imaging. Also notified of incidental findings noted below and is encouraged to discuss further with PCP who will receive a copy of this note and/or the CT report. Patient verbalizes understanding.   IMPRESSION: Lung-RADS 1, negative. Continue annual screening with low-dose chest CT without contrast in 12 months.  Aortic Atherosclerosis (ICD10-I70.0) and Emphysema (ICD10-J43.9). 

## 2020-03-22 ENCOUNTER — Emergency Department: Admission: EM | Admit: 2020-03-22 | Discharge: 2020-03-22 | Payer: BC Managed Care – PPO

## 2020-07-16 ENCOUNTER — Other Ambulatory Visit: Payer: Self-pay | Admitting: *Deleted

## 2020-07-16 DIAGNOSIS — Z87891 Personal history of nicotine dependence: Secondary | ICD-10-CM

## 2020-07-16 DIAGNOSIS — Z122 Encounter for screening for malignant neoplasm of respiratory organs: Secondary | ICD-10-CM

## 2020-07-16 NOTE — Progress Notes (Signed)
Contacted and scheduled for annual lung screening scan. Patient is a current smoker with a 47 pack year history.

## 2020-07-27 ENCOUNTER — Ambulatory Visit: Admission: RE | Admit: 2020-07-27 | Payer: BC Managed Care – PPO | Source: Ambulatory Visit

## 2020-07-30 ENCOUNTER — Ambulatory Visit
Admission: RE | Admit: 2020-07-30 | Discharge: 2020-07-30 | Disposition: A | Discharge status: Home / Self Care | Payer: BC Managed Care – PPO | Source: Ambulatory Visit | Attending: Nurse Practitioner | Admitting: Nurse Practitioner

## 2020-07-30 ENCOUNTER — Other Ambulatory Visit: Payer: Self-pay | Admitting: Nurse Practitioner

## 2020-07-30 ENCOUNTER — Other Ambulatory Visit: Payer: Self-pay

## 2020-07-30 DIAGNOSIS — Z122 Encounter for screening for malignant neoplasm of respiratory organs: Secondary | ICD-10-CM | POA: Insufficient documentation

## 2020-07-30 DIAGNOSIS — Z87891 Personal history of nicotine dependence: Secondary | ICD-10-CM | POA: Diagnosis present

## 2020-07-30 IMAGING — CT CT CHEST LUNG CANCER SCREENING LOW DOSE W/O CM
2 of 5 series · 15 of 40 positions shown, 18 images · non-contrast
Comparison: [DATE].

CLINICAL DATA: 64-year-old male with 47 pack-year history of
smoking. Lung cancer screening.

EXAM:
CT CHEST WITHOUT CONTRAST LOW-DOSE FOR LUNG CANCER SCREENING
TECHNIQUE: Multidetector CT imaging of the chest was performed following the
standard protocol without IV contrast.

[Series 3: lung 1.00 · axial · 0.64mm/px · z∈[-1210,-877]mm · 12 of 367 slices shown, 15 images]
[im 17/367  mediastinal]
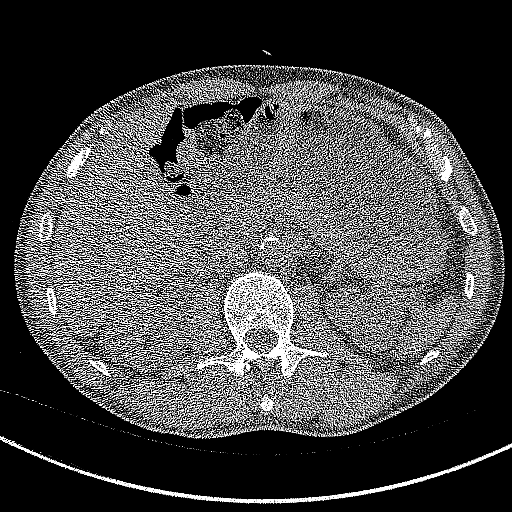
[im 17/367  lung]
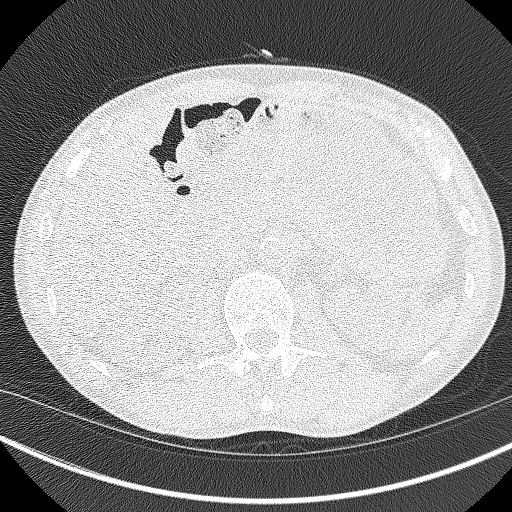
[im 50/367  lung]
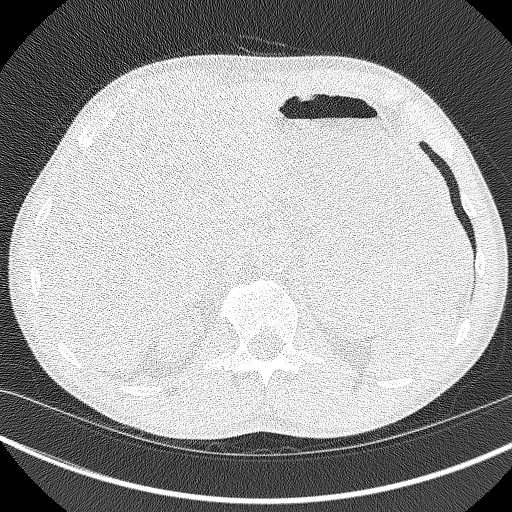
[im 84/367  lung]
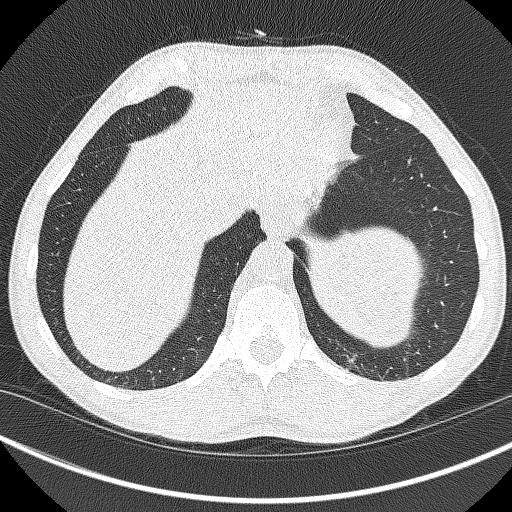
[im 117/367  lung]
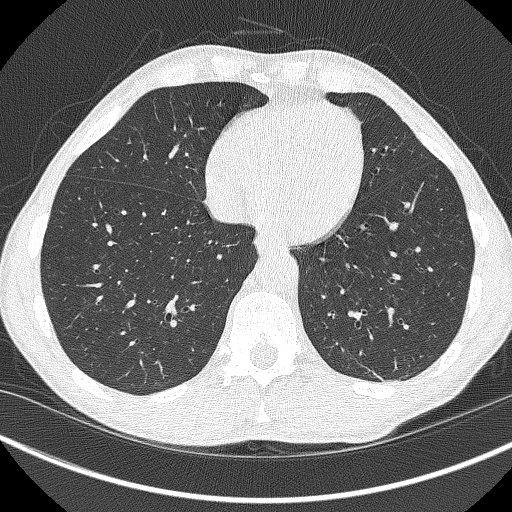
[im 134/367  mediastinal]
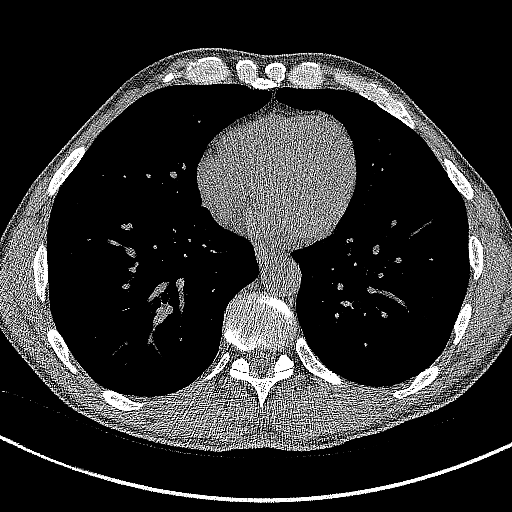
[im 134/367  lung]
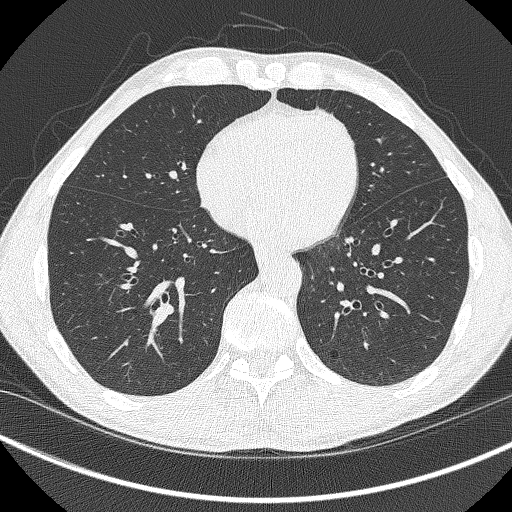
[im 167/367  lung]
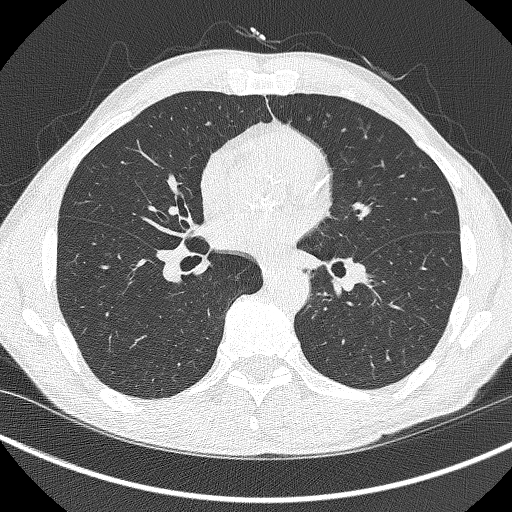
[im 200/367  lung]
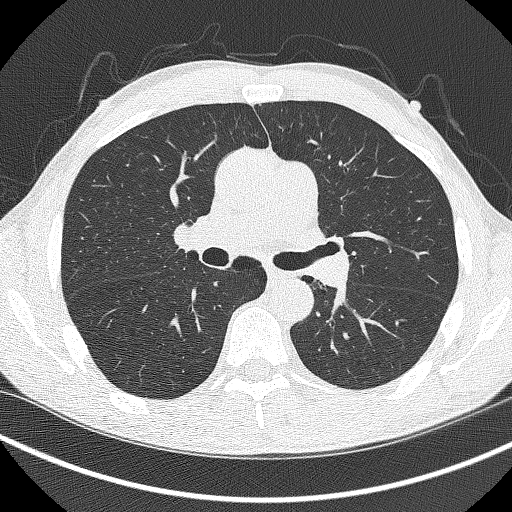
[im 233/367  lung]
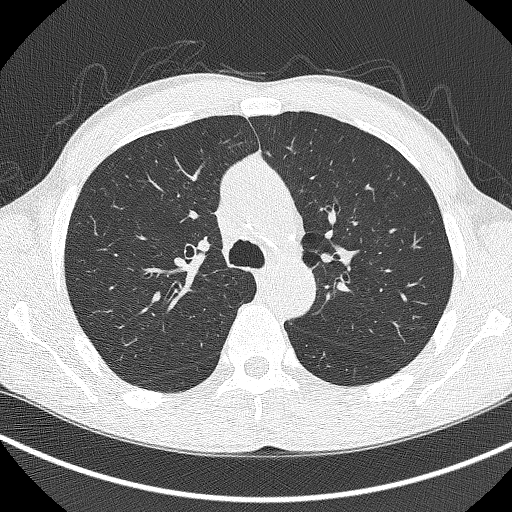
[im 250/367  mediastinal]
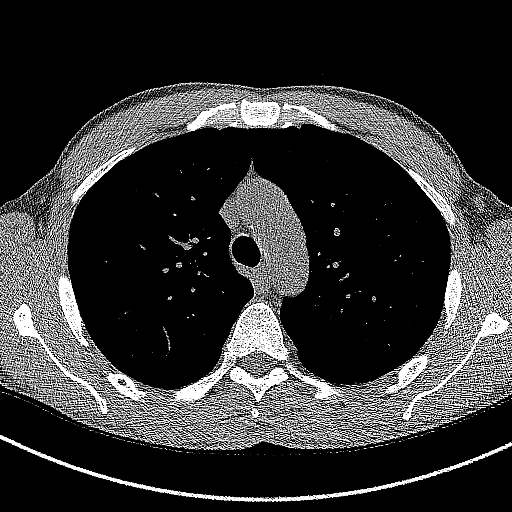
[im 250/367  lung]
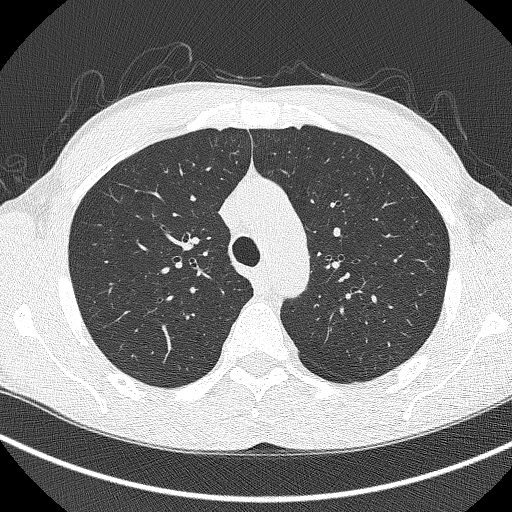
[im 283/367  lung]
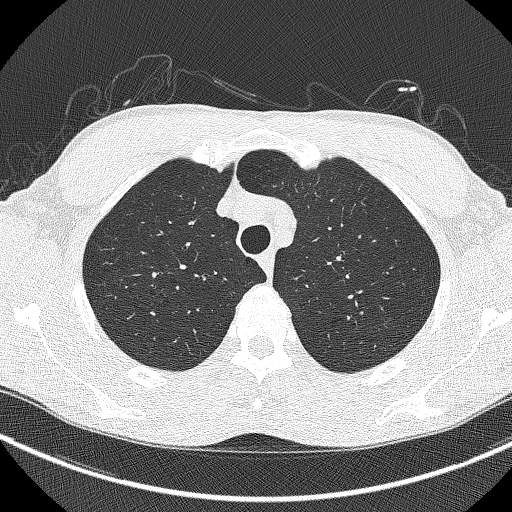
[im 317/367  lung]
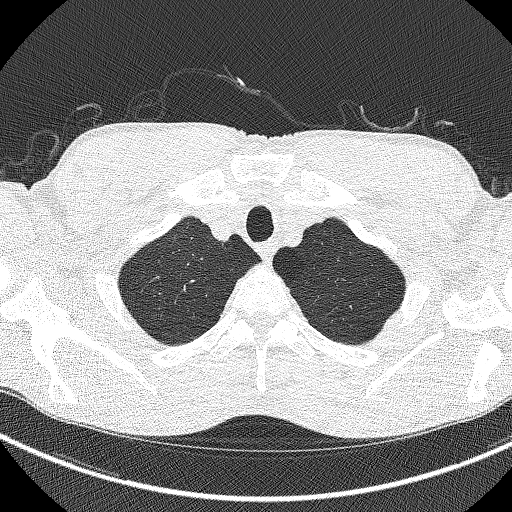
[im 350/367  lung]
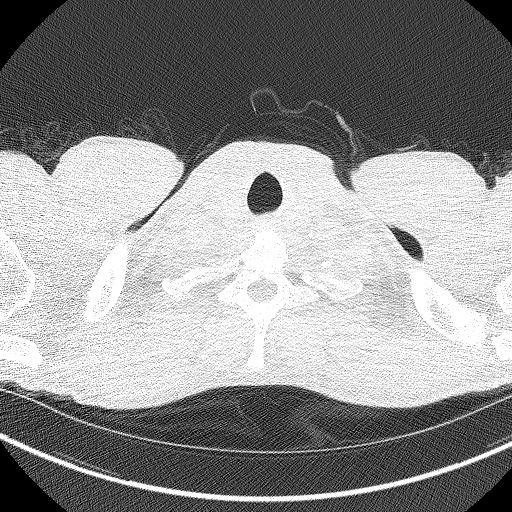

[Series 4: coronals lung 1.00 cor · coronal · 0.64mm/px · 3 of 266 slices shown]
[im 54/266  lung]
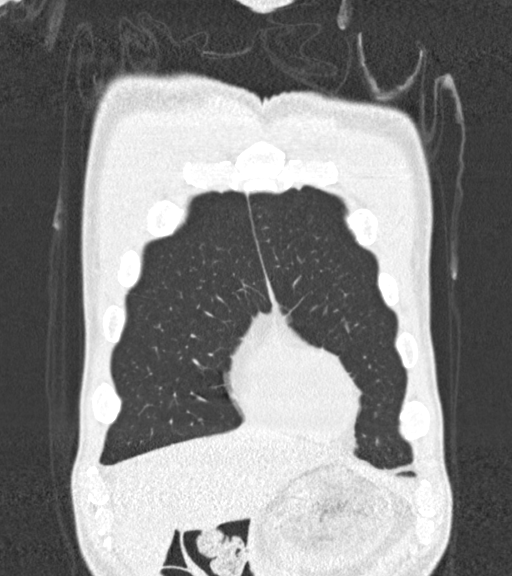
[im 107/266  lung]
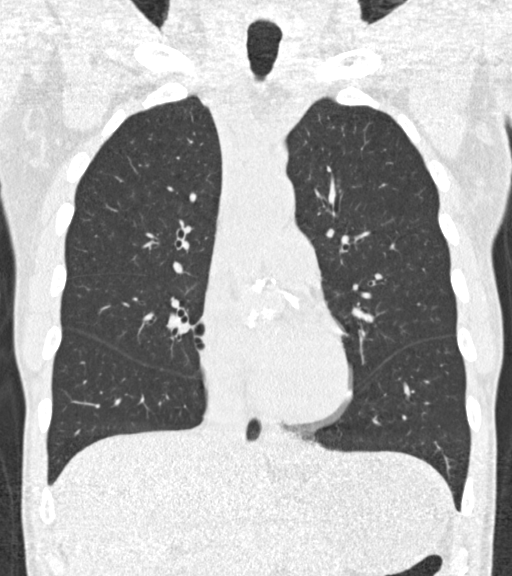
[im 160/266  lung]
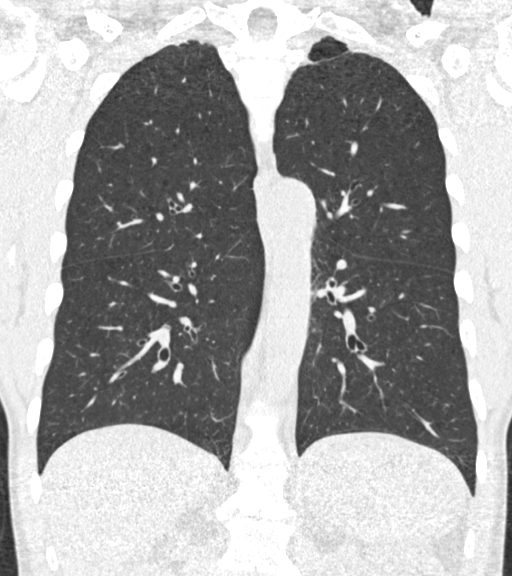

[15 of 40 positions shown; findings below may reference images not displayed]

FINDINGS: Cardiovascular: The heart size is normal. No substantial pericardial
effusion. Coronary artery calcification is evident. Ascending
thoracic aorta measures 4 cm diameter.

Mediastinum/Nodes: No mediastinal lymphadenopathy. No evidence for
gross hilar lymphadenopathy although assessment is limited by the
lack of intravenous contrast on today's study. The esophagus has
normal imaging features. There is no axillary lymphadenopathy.

Lungs/Pleura: Centrilobular and paraseptal emphysema evident.
Previously identified tiny pulmonary nodules are stable. No new
suspicious pulmonary nodule or mass. No focal airspace
consolidation. No pleural effusion.

Upper Abdomen: Unremarkable.

Musculoskeletal: No worrisome lytic or sclerotic osseous
abnormality.
IMPRESSION: 1. Lung-RADS 2, benign appearance or behavior. Continue annual
screening with low-dose chest CT without contrast in 12 months.
2. Aortic Atherosclerosis ([0Z]-[0Z]) and Emphysema ([0Z]-[0Z]).

## 2020-08-03 ENCOUNTER — Encounter: Payer: Self-pay | Admitting: *Deleted

## 2021-07-11 DIAGNOSIS — Z952 Presence of prosthetic heart valve: Secondary | ICD-10-CM

## 2021-07-11 HISTORY — DX: Presence of prosthetic heart valve: Z95.2

## 2021-07-30 ENCOUNTER — Encounter (HOSPITAL_COMMUNITY): Payer: Self-pay | Admitting: Radiology

## 2021-09-08 ENCOUNTER — Telehealth: Payer: Self-pay | Admitting: *Deleted

## 2021-09-08 NOTE — Telephone Encounter (Signed)
LMTC to schedule yearly Lung CA CT Scan. ?

## 2021-09-29 DIAGNOSIS — E782 Mixed hyperlipidemia: Secondary | ICD-10-CM | POA: Diagnosis not present

## 2021-09-29 DIAGNOSIS — I10 Essential (primary) hypertension: Secondary | ICD-10-CM | POA: Diagnosis not present

## 2021-09-29 DIAGNOSIS — I350 Nonrheumatic aortic (valve) stenosis: Secondary | ICD-10-CM | POA: Diagnosis not present

## 2021-09-29 DIAGNOSIS — R634 Abnormal weight loss: Secondary | ICD-10-CM | POA: Diagnosis not present

## 2021-09-29 DIAGNOSIS — D649 Anemia, unspecified: Secondary | ICD-10-CM | POA: Diagnosis not present

## 2021-10-11 ENCOUNTER — Observation Stay: Payer: 59

## 2021-10-11 ENCOUNTER — Observation Stay
Admission: EM | Admit: 2021-10-11 | Discharge: 2021-10-12 | Disposition: A | Discharge status: Other Acute Inpatient Hospital | Payer: 59 | Attending: Internal Medicine | Admitting: Internal Medicine

## 2021-10-11 ENCOUNTER — Other Ambulatory Visit: Payer: Self-pay

## 2021-10-11 ENCOUNTER — Emergency Department: Payer: 59

## 2021-10-11 ENCOUNTER — Observation Stay
Admit: 2021-10-11 | Discharge: 2021-10-11 | Disposition: A | Discharge status: Home / Self Care | Payer: 59 | Attending: Internal Medicine | Admitting: Internal Medicine

## 2021-10-11 DIAGNOSIS — I10 Essential (primary) hypertension: Secondary | ICD-10-CM | POA: Diagnosis not present

## 2021-10-11 DIAGNOSIS — H5461 Unqualified visual loss, right eye, normal vision left eye: Secondary | ICD-10-CM | POA: Diagnosis not present

## 2021-10-11 DIAGNOSIS — H539 Unspecified visual disturbance: Secondary | ICD-10-CM | POA: Diagnosis not present

## 2021-10-11 DIAGNOSIS — H748X1 Other specified disorders of right middle ear and mastoid: Secondary | ICD-10-CM | POA: Diagnosis not present

## 2021-10-11 DIAGNOSIS — D72829 Elevated white blood cell count, unspecified: Secondary | ICD-10-CM | POA: Insufficient documentation

## 2021-10-11 DIAGNOSIS — H5340 Unspecified visual field defects: Secondary | ICD-10-CM

## 2021-10-11 DIAGNOSIS — Z79899 Other long term (current) drug therapy: Secondary | ICD-10-CM | POA: Insufficient documentation

## 2021-10-11 DIAGNOSIS — M47812 Spondylosis without myelopathy or radiculopathy, cervical region: Secondary | ICD-10-CM | POA: Diagnosis not present

## 2021-10-11 DIAGNOSIS — H538 Other visual disturbances: Secondary | ICD-10-CM | POA: Diagnosis not present

## 2021-10-11 DIAGNOSIS — R0602 Shortness of breath: Secondary | ICD-10-CM | POA: Diagnosis not present

## 2021-10-11 DIAGNOSIS — Z8673 Personal history of transient ischemic attack (TIA), and cerebral infarction without residual deficits: Secondary | ICD-10-CM | POA: Diagnosis not present

## 2021-10-11 DIAGNOSIS — F17210 Nicotine dependence, cigarettes, uncomplicated: Secondary | ICD-10-CM | POA: Diagnosis not present

## 2021-10-11 DIAGNOSIS — M542 Cervicalgia: Secondary | ICD-10-CM | POA: Insufficient documentation

## 2021-10-11 DIAGNOSIS — H53451 Other localized visual field defect, right eye: Secondary | ICD-10-CM | POA: Diagnosis not present

## 2021-10-11 DIAGNOSIS — I672 Cerebral atherosclerosis: Secondary | ICD-10-CM | POA: Diagnosis not present

## 2021-10-11 DIAGNOSIS — M2578 Osteophyte, vertebrae: Secondary | ICD-10-CM | POA: Diagnosis not present

## 2021-10-11 DIAGNOSIS — I6523 Occlusion and stenosis of bilateral carotid arteries: Secondary | ICD-10-CM | POA: Diagnosis not present

## 2021-10-11 DIAGNOSIS — I639 Cerebral infarction, unspecified: Principal | ICD-10-CM | POA: Insufficient documentation

## 2021-10-11 DIAGNOSIS — Z7982 Long term (current) use of aspirin: Secondary | ICD-10-CM | POA: Diagnosis not present

## 2021-10-11 DIAGNOSIS — J3489 Other specified disorders of nose and nasal sinuses: Secondary | ICD-10-CM | POA: Diagnosis not present

## 2021-10-11 DIAGNOSIS — R051 Acute cough: Secondary | ICD-10-CM | POA: Diagnosis not present

## 2021-10-11 DIAGNOSIS — G459 Transient cerebral ischemic attack, unspecified: Secondary | ICD-10-CM | POA: Diagnosis not present

## 2021-10-11 HISTORY — DX: Essential (primary) hypertension: I10

## 2021-10-11 LAB — CBC WITH DIFFERENTIAL/PLATELET
Abs Immature Granulocytes: 0.08 10*3/uL — ABNORMAL HIGH (ref 0.00–0.07)
Basophils Absolute: 0.1 10*3/uL (ref 0.0–0.1)
Basophils Relative: 0 %
Eosinophils Absolute: 0 10*3/uL (ref 0.0–0.5)
Eosinophils Relative: 0 %
HCT: 37.3 % — ABNORMAL LOW (ref 39.0–52.0)
Hemoglobin: 12.4 g/dL — ABNORMAL LOW (ref 13.0–17.0)
Immature Granulocytes: 1 %
Lymphocytes Relative: 4 %
Lymphs Abs: 0.5 10*3/uL — ABNORMAL LOW (ref 0.7–4.0)
MCH: 28.8 pg (ref 26.0–34.0)
MCHC: 33.2 g/dL (ref 30.0–36.0)
MCV: 86.7 fL (ref 80.0–100.0)
Monocytes Absolute: 0.7 10*3/uL (ref 0.1–1.0)
Monocytes Relative: 4 %
Neutro Abs: 13.4 10*3/uL — ABNORMAL HIGH (ref 1.7–7.7)
Neutrophils Relative %: 91 %
Platelets: 267 10*3/uL (ref 150–400)
RBC: 4.3 MIL/uL (ref 4.22–5.81)
RDW: 15 % (ref 11.5–15.5)
WBC: 14.8 10*3/uL — ABNORMAL HIGH (ref 4.0–10.5)
nRBC: 0 % (ref 0.0–0.2)

## 2021-10-11 LAB — URINALYSIS, COMPLETE (UACMP) WITH MICROSCOPIC
Bacteria, UA: NONE SEEN
Bilirubin Urine: NEGATIVE
Glucose, UA: NEGATIVE mg/dL
Hgb urine dipstick: NEGATIVE
Ketones, ur: NEGATIVE mg/dL
Nitrite: NEGATIVE
Protein, ur: NEGATIVE mg/dL
Specific Gravity, Urine: 1.013 (ref 1.005–1.030)
Squamous Epithelial / HPF: NONE SEEN (ref 0–5)
pH: 5 (ref 5.0–8.0)

## 2021-10-11 LAB — BASIC METABOLIC PANEL
Anion gap: 9 (ref 5–15)
BUN: 24 mg/dL — ABNORMAL HIGH (ref 8–23)
CO2: 28 mmol/L (ref 22–32)
Calcium: 8.3 mg/dL — ABNORMAL LOW (ref 8.9–10.3)
Chloride: 94 mmol/L — ABNORMAL LOW (ref 98–111)
Creatinine, Ser: 1.2 mg/dL (ref 0.61–1.24)
GFR, Estimated: >60 mL/min (ref >60)
Glucose, Bld: 111 mg/dL — ABNORMAL HIGH (ref 70–99)
Potassium: 3.8 mmol/L (ref 3.5–5.1)
Sodium: 131 mmol/L — ABNORMAL LOW (ref 135–145)

## 2021-10-11 LAB — PROCALCITONIN: Procalcitonin: 1.48 ng/mL

## 2021-10-11 IMAGING — CR DG CHEST 2V
1 series · 2 of 2 positions shown · non-contrast
Comparison: No prior chest radiograph, correlation is made with
[DATE] CT chest

CLINICAL DATA: TIA

EXAM:
CHEST - 2 VIEW

[Series 1: dg chest 2 view · 0.14mm/px · 2 of 2 slices shown]
[im 1/2]
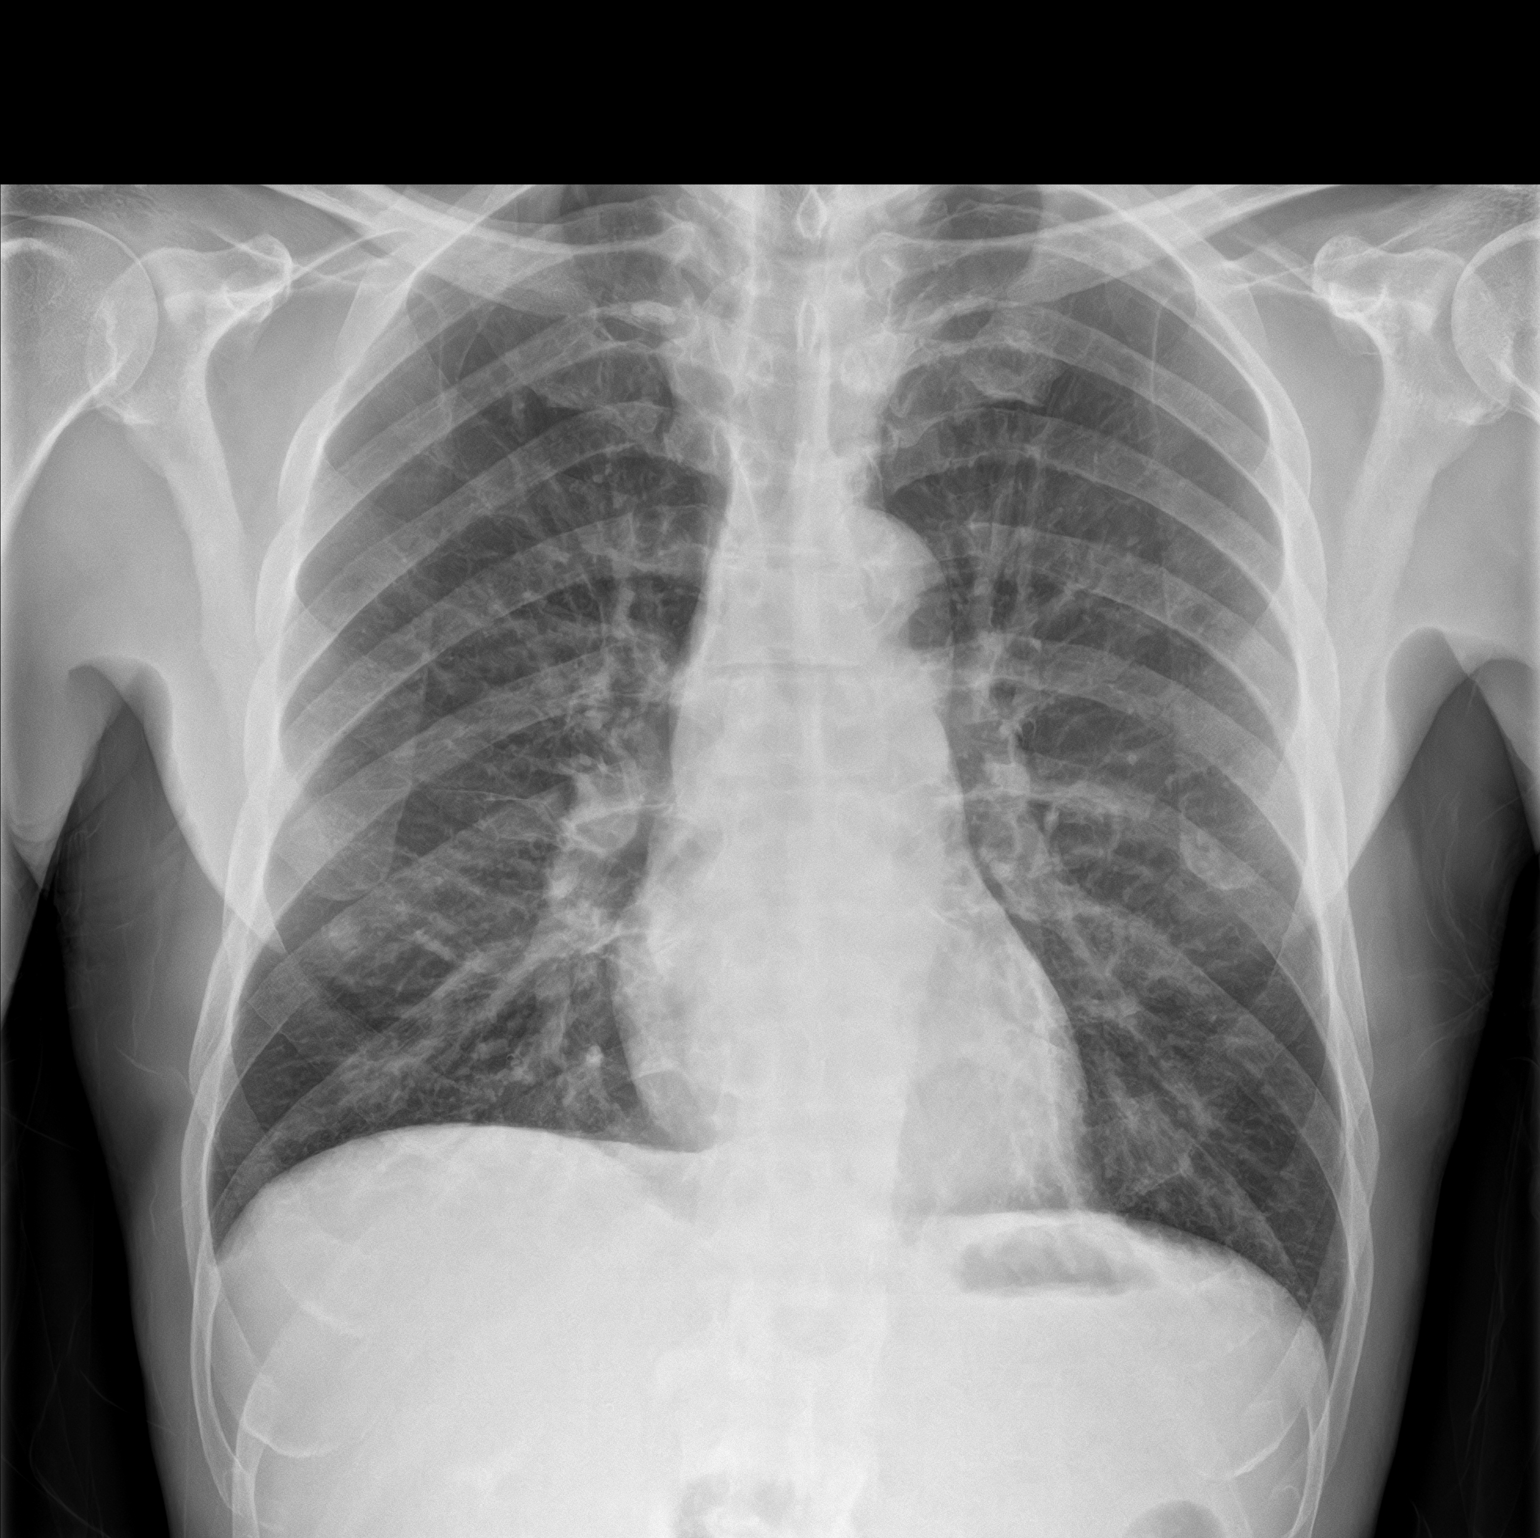
[im 2/2]
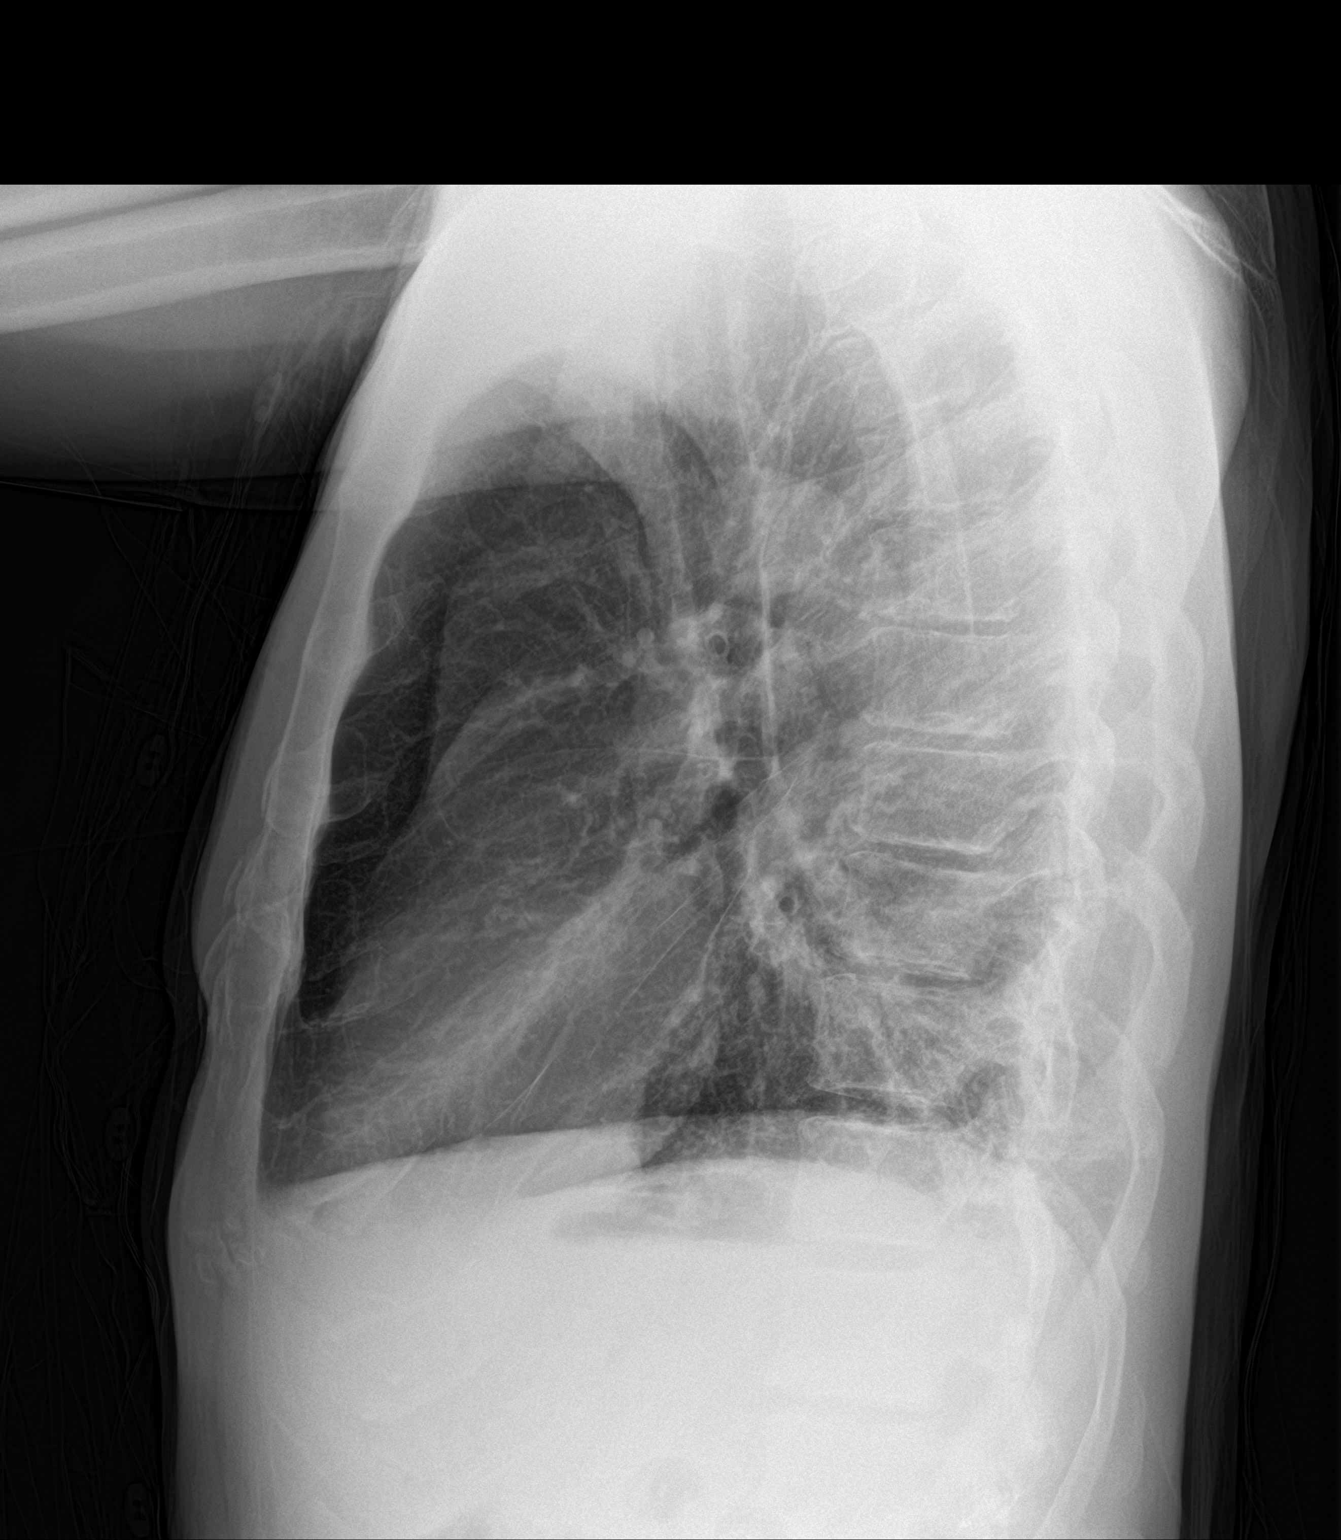

[2 of 2 positions shown; findings below may reference images not displayed]

FINDINGS: Cardiac and mediastinal contours are within normal limits. No focal
pulmonary opacity. No pleural effusion or pneumothorax. No acute
osseous abnormality.
IMPRESSION: No acute cardiopulmonary process.

## 2021-10-11 IMAGING — MR MR HEAD W/O CM
11 series · 48 of 48 positions shown · non-contrast
Comparison: Head CT [DATE].

CLINICAL DATA: Transient ischemic attack.

EXAM:
MRI HEAD WITHOUT CONTRAST
TECHNIQUE: Multiplanar, multiecho pulse sequences of the brain and surrounding
structures were obtained without intravenous contrast.

[Series 5: ax dwi_tracew · axial · 3.0mm · 0.71mm/px · z∈[-74,+90]mm · 5 of 56 slices shown]
[im 1/56]
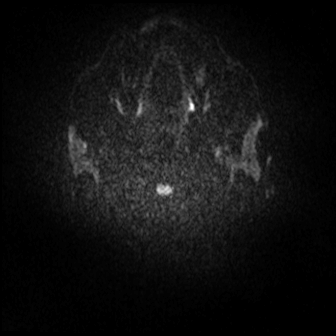
[im 14/56]
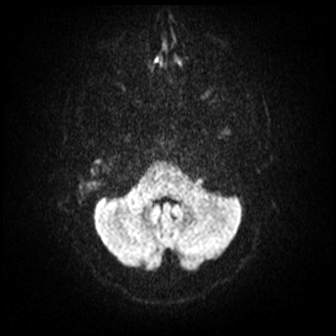
[im 28/56]
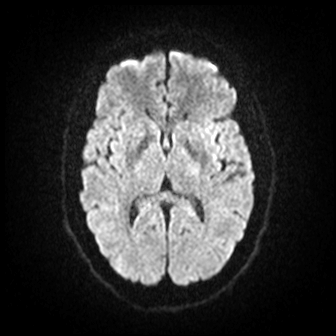
[im 42/56]
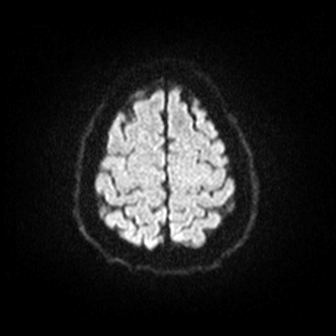
[im 56/56]
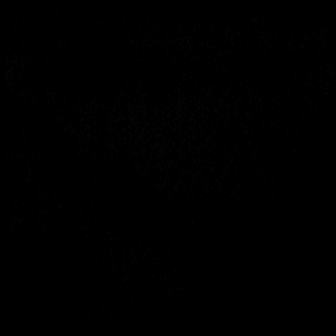

[Series 6: ax dwi_adc · axial · 3.0mm · 0.71mm/px · z∈[-74,+84]mm · 4 of 53 slices shown]
[im 1/53]
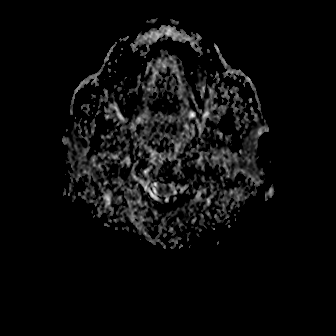
[im 18/53]
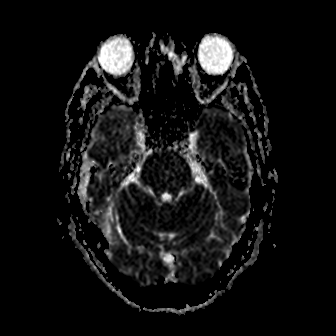
[im 35/53]
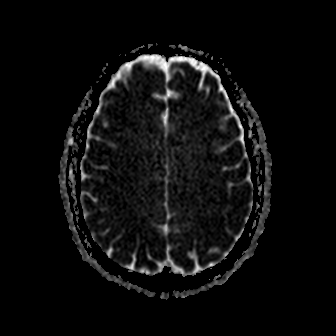
[im 53/53]
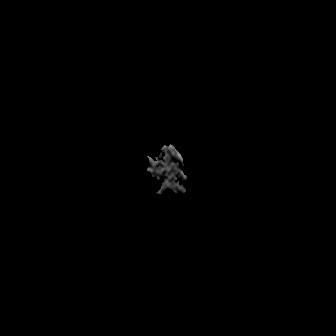

[Series 7: cor dwi_tracew · coronal · 5.0mm · 0.68mm/px · 3 of 40 slices shown]
[im 1/40]
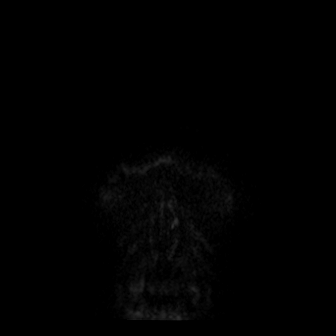
[im 20/40]
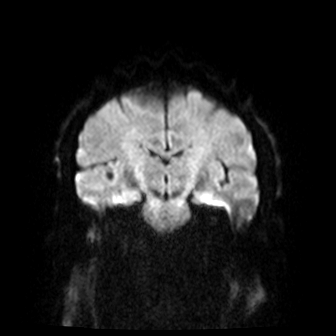
[im 40/40]
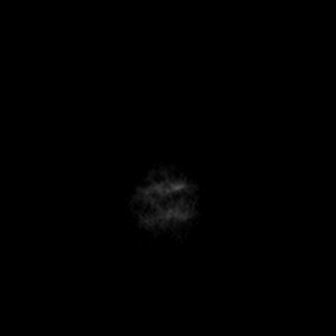

[Series 8: cor dwi_adc · coronal · 5.0mm · 0.68mm/px · 3 of 40 slices shown]
[im 1/40]
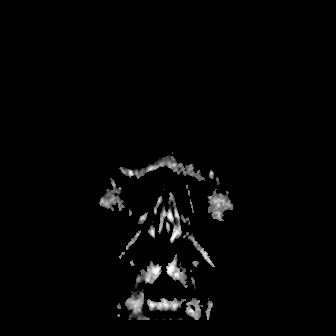
[im 20/40]
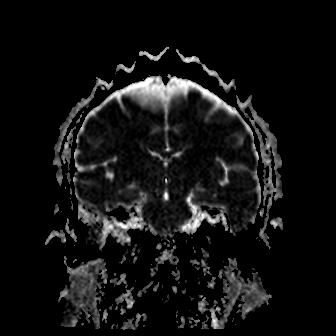
[im 40/40]
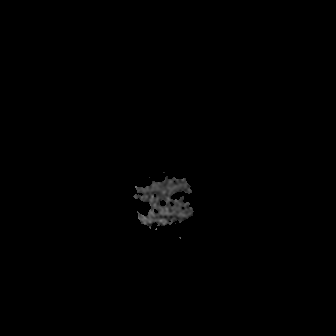

[Series 9: T1 · sagittal · 5.0mm · 0.47mm/px · 2 of 22 slices shown (1 of 2)]
[im 1/22]
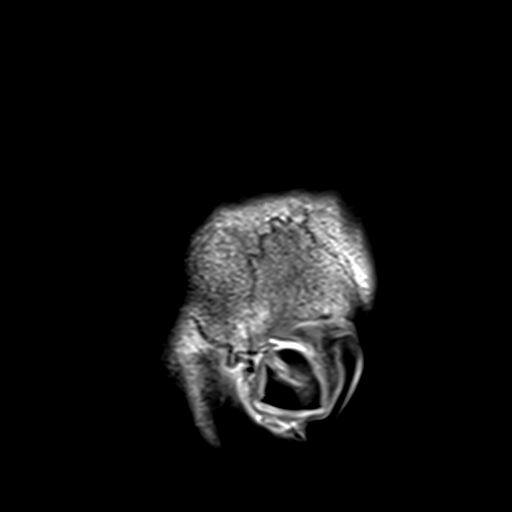
[im 22/22]
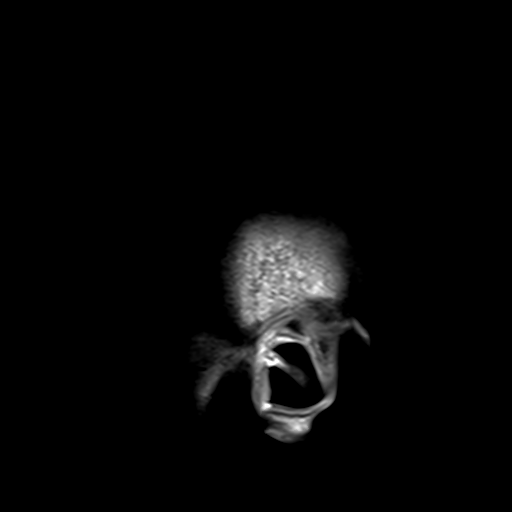

[Series 10: T2 · axial · 5.0mm · 0.86mm/px · z∈[-72,+84]mm · 2 of 27 slices shown (1 of 2)]
[im 1/27]
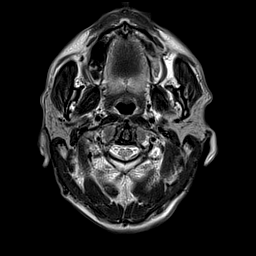
[im 27/27]
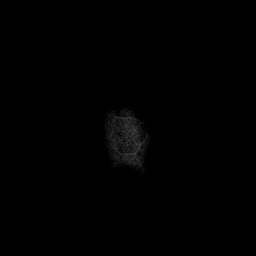

[Series 12: ax swi_pha · axial · 3.0mm · 0.90mm/px · z∈[-76,+88]mm · 5 of 56 slices shown]
[im 1/56]
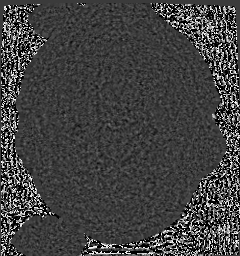
[im 14/56]
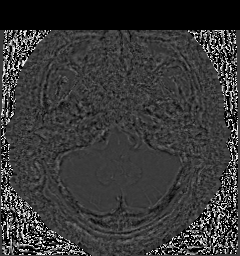
[im 28/56]
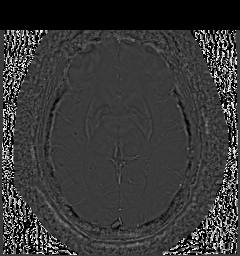
[im 42/56]
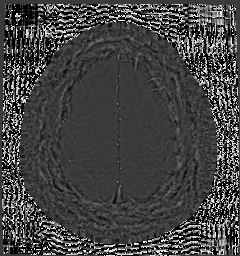
[im 56/56]
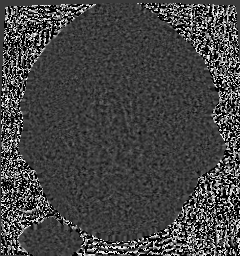

[Series 13: ax swi_swi · axial · 3.0mm · 0.90mm/px · z∈[-76,+88]mm · 5 of 56 slices shown]
[im 1/56]
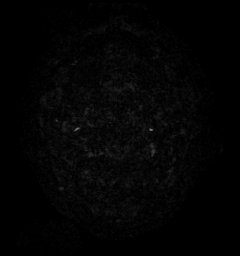
[im 14/56]
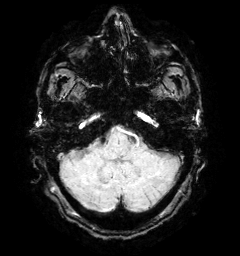
[im 28/56]
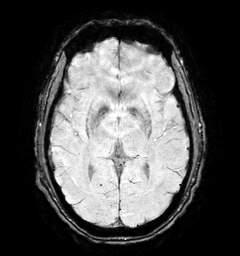
[im 42/56]
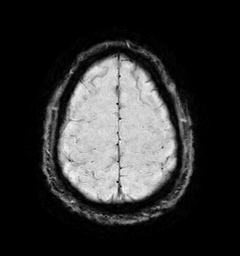
[im 56/56]
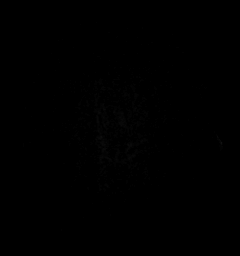

[Series 15: FLAIR · axial · 3.0mm · 0.69mm/px · z∈[-75,+87]mm · 4 of 55 slices shown]
[im 1/55]
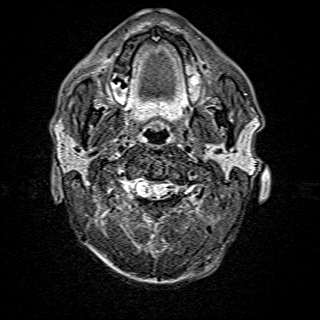
[im 19/55]
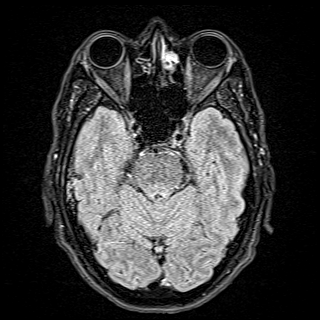
[im 37/55]
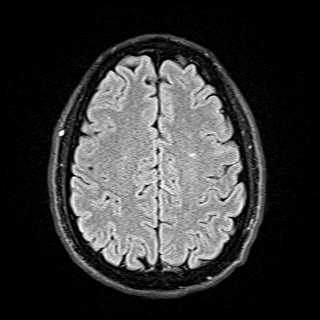
[im 55/55]
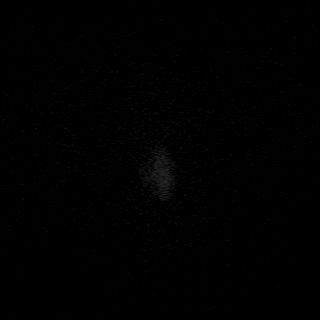

[Series 16: T1 · axial · 1.0mm · 0.98mm/px · z∈[-70,+88]mm · 13 of 160 slices shown (2 of 2)]
[im 1/160]
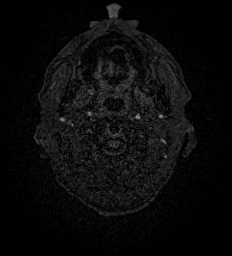
[im 14/160]
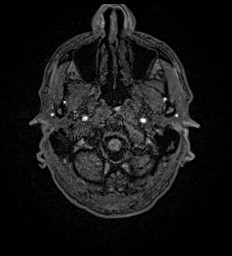
[im 27/160]
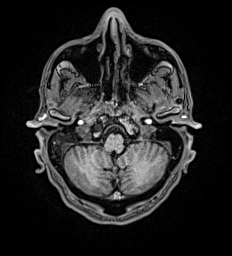
[im 40/160]
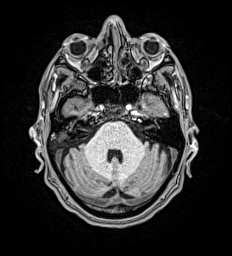
[im 54/160]
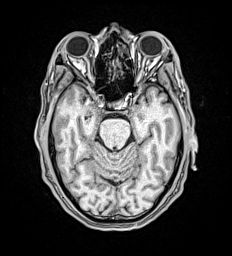
[im 67/160]
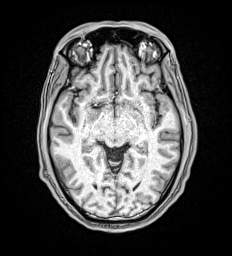
[im 80/160]
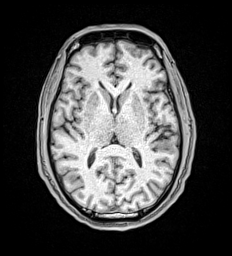
[im 93/160]
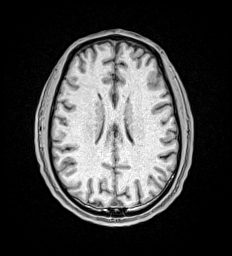
[im 107/160]
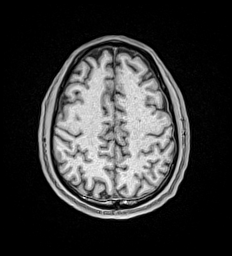
[im 120/160]
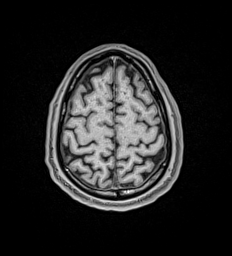
[im 133/160]
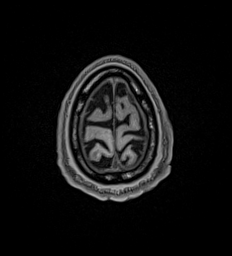
[im 146/160]
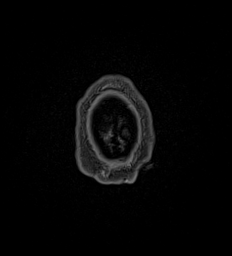
[im 160/160]
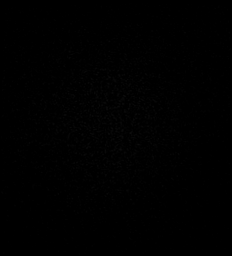

[Series 17: T2 · coronal · 5.0mm · 0.86mm/px · 2 of 30 slices shown (2 of 2)]
[im 1/30]
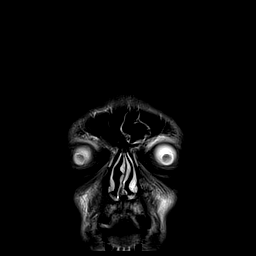
[im 30/30]
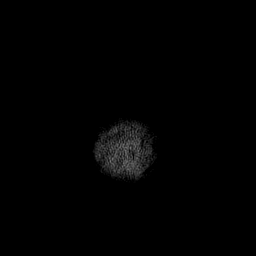

[48 of 48 positions shown; findings below may reference images not displayed]

FINDINGS: Brain: Punctate focus of restricted diffusion in the right centrum
semiovale, consistent with an acute/subacute infarct. A few foci of
T2 hyperintensity are seen within the white matter of cerebral
hemispheres, most likely related to early chronic microangiopathy.
No hemorrhage, hydrocephalus, extra-axial collection or mass lesion.

Vascular: Normal flow voids.

Skull and upper cervical spine: Normal marrow signal.

Sinuses/Orbits: Scattered mucosal thickening throughout the
paranasal sinuses. The orbits are maintained.

Other: Prominent right mastoid effusion.
IMPRESSION: 1. Punctate focus of restricted diffusion in the right centrum
semiovale consistent with acute/subacute lacunar infarct.
2. A few nonspecific T2 hyperintense lesions the white matter, may
represent early chronic microangiopathy.

## 2021-10-11 IMAGING — MR MR CERVICAL SPINE W/O CM
5 series · 36 of 48 positions shown · non-contrast
Comparison: CT angiography same day.

CLINICAL DATA: Right-sided neck pain radiating to the head over the
last 2 days.

EXAM:
MRI CERVICAL SPINE WITHOUT CONTRAST
TECHNIQUE: Multiplanar, multisequence MR imaging of the cervical spine was
performed. No intravenous contrast was administered.

[Series 9: T2 · sagittal · 3.0mm · 0.62mm/px · 6 of 15 slices shown (1 of 2)]
[im 1/15]
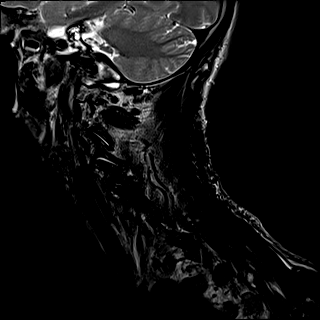
[im 3/15]
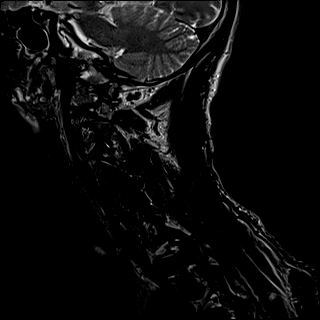
[im 6/15]
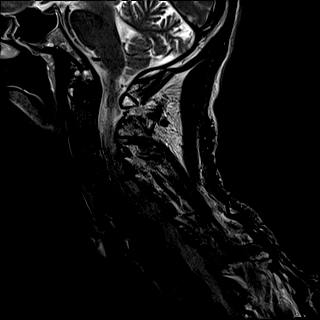
[im 9/15]
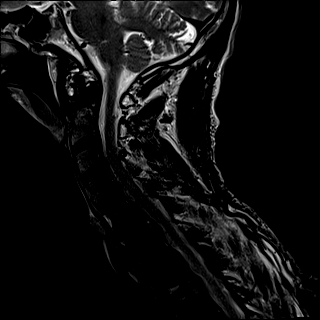
[im 12/15]
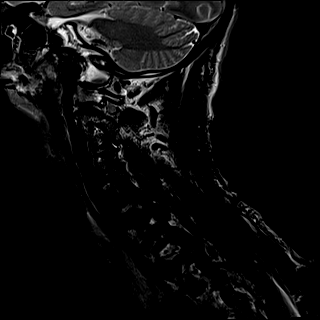
[im 15/15]
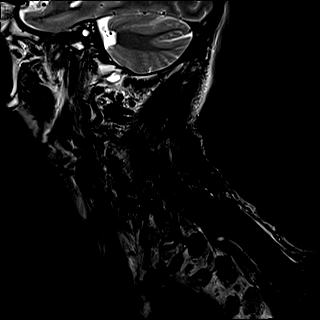

[Series 10: FLAIR · sagittal · 3.0mm · 0.78mm/px · 6 of 15 slices shown]
[im 1/15]
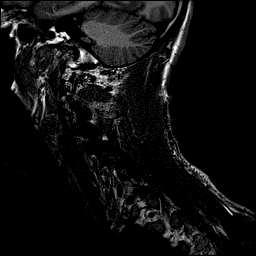
[im 3/15]
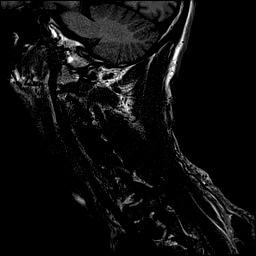
[im 6/15]
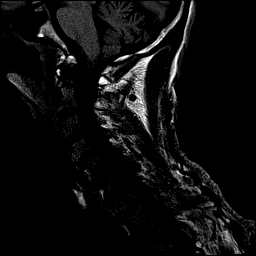
[im 9/15]
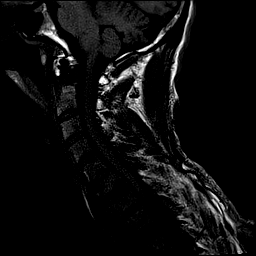
[im 12/15]
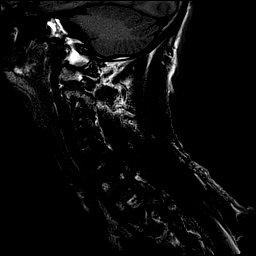
[im 15/15]
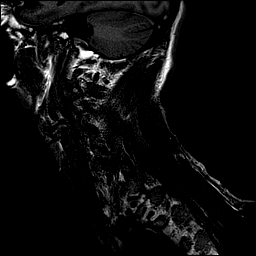

[Series 11: STIR · sagittal · 3.0mm · 0.62mm/px · 6 of 15 slices shown]
[im 1/15]
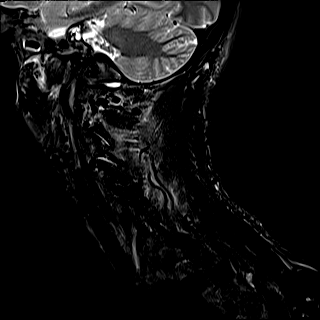
[im 3/15]
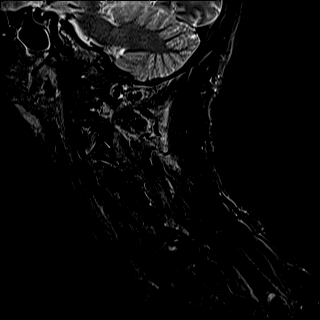
[im 6/15]
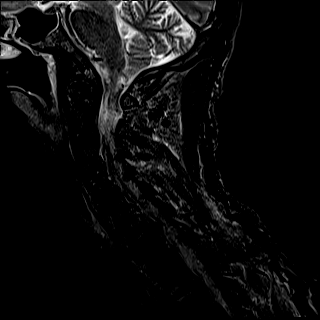
[im 9/15]
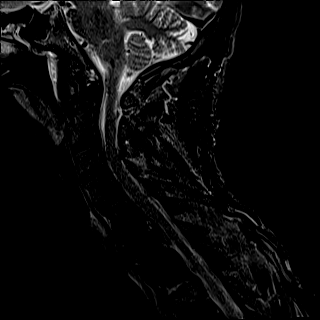
[im 12/15]
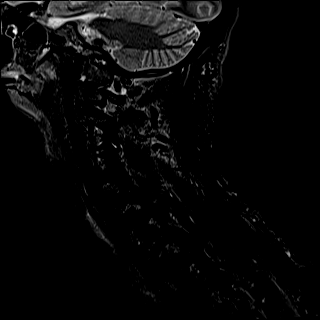
[im 15/15]
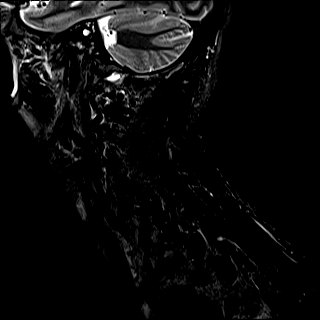

[Series 12: T2 · axial · 3.0mm · 0.70mm/px · z∈[-226,-113]mm · 10 of 38 slices shown (2 of 2)]
[im 1/38]
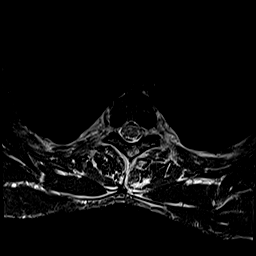
[im 3/38]
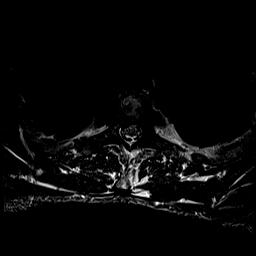
[im 6/38]
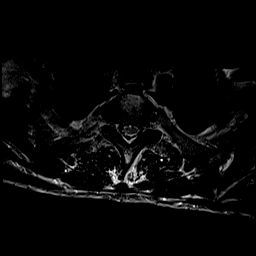
[im 11/38]
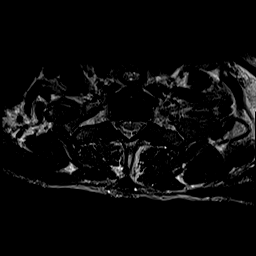
[im 16/38]
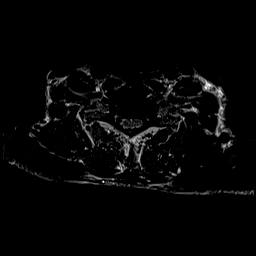
[im 19/38]
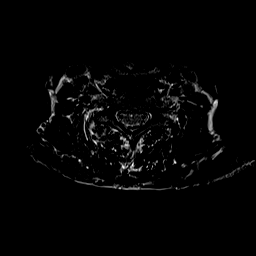
[im 22/38]
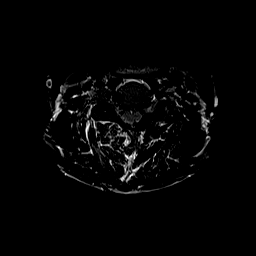
[im 27/38]
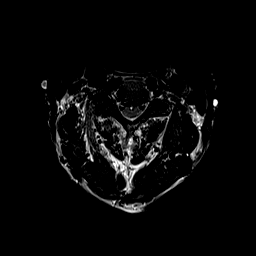
[im 32/38]
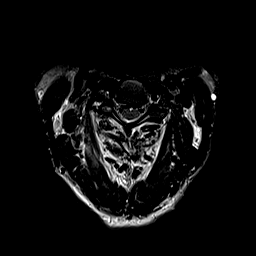
[im 38/38]
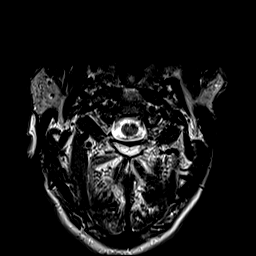

[Series 13: ax mpgr · axial · 3.0mm · 0.35mm/px · z∈[-226,-113]mm · 8 of 38 slices shown]
[im 1/38]
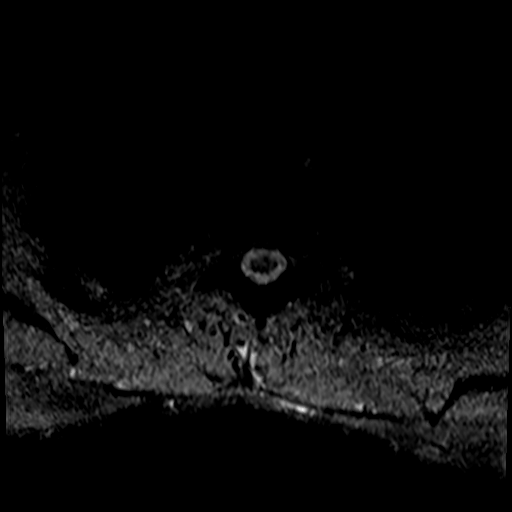
[im 6/38]
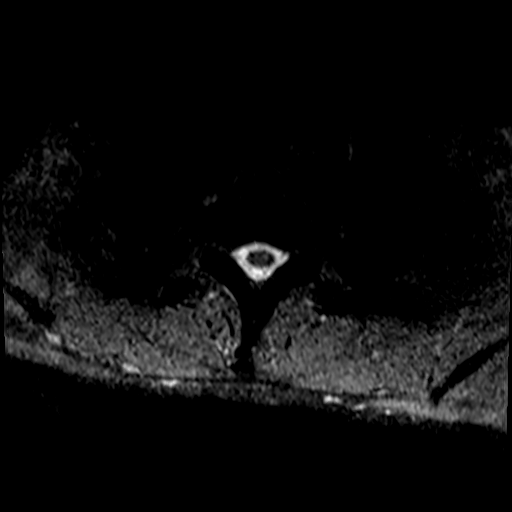
[im 11/38]
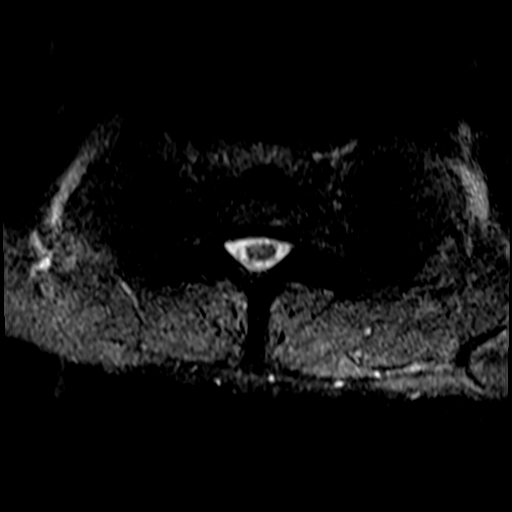
[im 16/38]
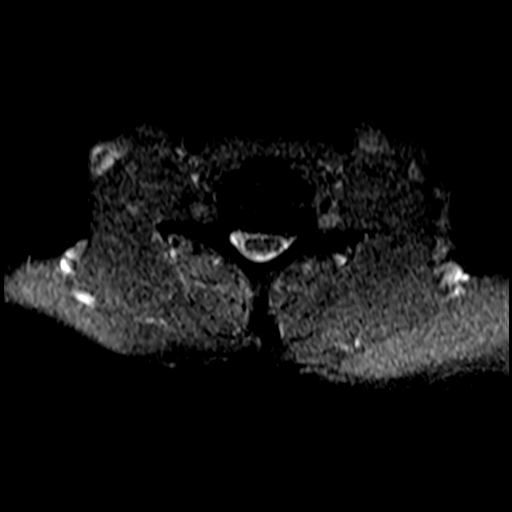
[im 22/38]
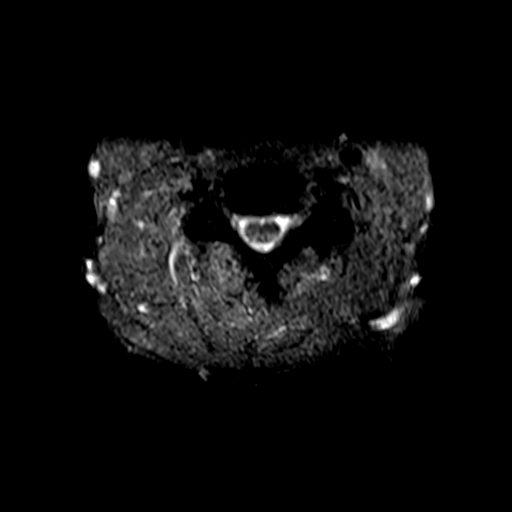
[im 27/38]
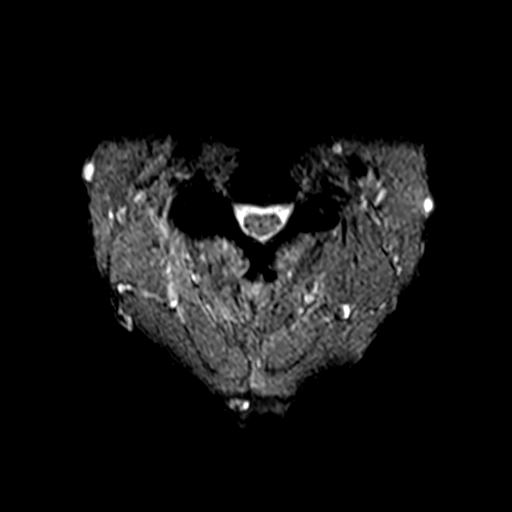
[im 32/38]
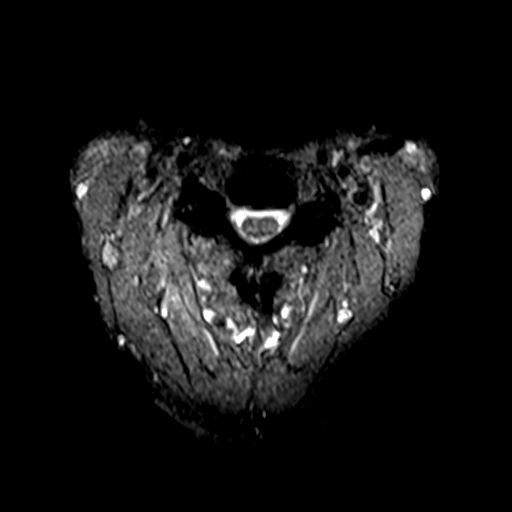
[im 38/38]
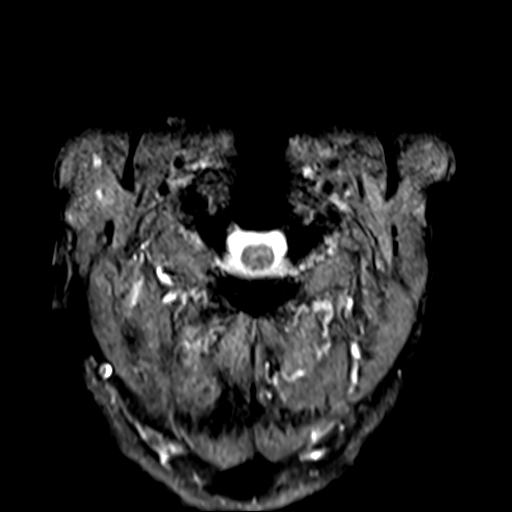

[36 of 48 positions shown; findings below may reference images not displayed]

FINDINGS: Alignment: Mild straightening of the normal cervical lordosis.

Vertebrae: Discogenic endplate edematous changes at T1-2. These are
presumed to relate to degenerative disc disease and endplate
Schmorl's nodes. In the right setting, this picture could indicate
early discitis osteomyelitis. Does the patient have signs or
symptoms of infection?

Cord: No cord compression or focal cord lesion.

Posterior Fossa, vertebral arteries, paraspinal tissues: There does
appear to be some nonspecific soft tissue edema of the right para
spinous tissues adjacent to the right facets at C2-3 and C3-4. See
below.

Disc levels:

The foramen magnum is widely patent. There is ordinary mild
osteoarthritis of the C1-2 articulation but no encroachment upon the
neural structures.

C2-3: Facet osteoarthritis right more than left. Small joint
effusion. No disc pathology. No stenosis.

C3-4: Facet osteoarthritis right more than left, with edematous
change. No disc pathology. Mild right foraminal narrowing.

C4-5: Facet osteoarthritis on the right, but without edema or joint
effusion. Mild bulging of the disc. No compressive stenosis.

C5-6: Spondylosis with endplate osteophytes and bulging of the disc.
Facet osteoarthritis on the left without edema or effusion.
Narrowing of the ventral subarachnoid space but no compression of
the cord. Mild bony foraminal narrowing on the left.

C6-7: Spondylosis with endplate osteophytes and bulging of the disc.
Narrowing of the ventral subarachnoid space but no compression of
the cord. Mild foraminal narrowing, left more than right.

C7-T1: Normal interspace.
IMPRESSION: Bone marrow edema at the endplates of T1 and T2. Some endplate
deformity. Findings could simply relate to degenerative disc disease
with a superior endplate Schmorl's node at T2. This could relate to
regional pain. It should be noted that in the early stages, discitis
osteomyelitis could have this appearance. Does the patient have
clinical evidence of infection?

Facet arthritis on the right at C2-3 and C3-4 with small joint
effusions. Edematous change of the adjacent soft tissues. Findings
could be due to inflammatory facet arthropathy and could be a cause
of pain. This could be non infectious or infectious.

More chronic appearing facet arthritis on the right at C4-5 and on
the left at C5-6, without edema or effusions.

No compressive canal stenosis. Ordinary degenerative spondylosis at
each level as outlined above.

## 2021-10-11 MED ORDER — IOHEXOL 350 MG/ML SOLN
75.0000 mL | Freq: Once | INTRAVENOUS | Status: AC | PRN
  Administered 2021-10-11: 75 mL via INTRAVENOUS
  Filled 2021-10-11: qty 75

## 2021-10-11 MED ORDER — ASPIRIN EC 81 MG PO TBEC
81.0000 mg | DELAYED_RELEASE_TABLET | Freq: Every day | ORAL | Status: DC
Start: 2021-10-11 — End: 2021-10-13
  Administered 2021-10-12: 81 mg via ORAL
  Filled 2021-10-11: qty 1

## 2021-10-11 MED ORDER — SODIUM CHLORIDE 0.9 % IV SOLN
3.0000 g | Freq: Four times a day (QID) | INTRAVENOUS | Status: DC
Start: 2021-10-11 — End: 2021-10-12
  Administered 2021-10-11 – 2021-10-12 (×3): 3 g via INTRAVENOUS
  Filled 2021-10-11 (×3): qty 8

## 2021-10-11 MED ORDER — LISINOPRIL 10 MG PO TABS: 5.0000 mg | ORAL_TABLET | Freq: Every day | ORAL | Status: DC

## 2021-10-11 MED ORDER — ACETAMINOPHEN 325 MG PO TABS
650.0000 mg | ORAL_TABLET | ORAL | Status: DC | PRN
  Administered 2021-10-11 – 2021-10-12 (×2): 650 mg via ORAL
  Filled 2021-10-11 (×2): qty 2

## 2021-10-11 MED ORDER — METOCLOPRAMIDE HCL 5 MG/ML IJ SOLN
10.0000 mg | INTRAMUSCULAR | Status: AC
  Administered 2021-10-11: 10 mg via INTRAVENOUS
  Filled 2021-10-11: qty 2

## 2021-10-11 MED ORDER — AMLODIPINE BESYLATE 5 MG PO TABS
10.0000 mg | ORAL_TABLET | Freq: Every day | ORAL | Status: DC
  Administered 2021-10-12: 10 mg via ORAL
  Filled 2021-10-11: qty 2

## 2021-10-11 MED ORDER — STROKE: EARLY STAGES OF RECOVERY BOOK: Freq: Once | Status: DC

## 2021-10-11 MED ORDER — SODIUM CHLORIDE 0.9 % IV BOLUS
1000.0000 mL | Freq: Once | INTRAVENOUS | Status: AC
  Administered 2021-10-11: 1000 mL via INTRAVENOUS

## 2021-10-11 MED ORDER — ACETAMINOPHEN 160 MG/5ML PO SOLN
650.0000 mg | ORAL | Status: DC | PRN
  Filled 2021-10-11: qty 20.3

## 2021-10-11 MED ORDER — KETOROLAC TROMETHAMINE 15 MG/ML IJ SOLN
15.0000 mg | Freq: Once | INTRAMUSCULAR | Status: AC
  Administered 2021-10-11: 15 mg via INTRAVENOUS
  Filled 2021-10-11: qty 1

## 2021-10-11 MED ORDER — KETOROLAC TROMETHAMINE 15 MG/ML IJ SOLN
15.0000 mg | Freq: Four times a day (QID) | INTRAMUSCULAR | Status: DC | PRN
  Administered 2021-10-11 – 2021-10-12 (×3): 15 mg via INTRAVENOUS
  Filled 2021-10-11 (×3): qty 1

## 2021-10-11 MED ORDER — ACETAMINOPHEN 325 MG RE SUPP: 650.0000 mg | RECTAL | Status: DC | PRN

## 2021-10-11 MED ORDER — DIPHENHYDRAMINE HCL 50 MG/ML IJ SOLN
25.0000 mg | Freq: Once | INTRAMUSCULAR | Status: AC
  Administered 2021-10-11: 25 mg via INTRAVENOUS
  Filled 2021-10-11: qty 1

## 2021-10-11 MED ORDER — PANTOPRAZOLE SODIUM 40 MG PO TBEC: 40.0000 mg | DELAYED_RELEASE_TABLET | Freq: Every day | ORAL | Status: DC

## 2021-10-11 NOTE — Consult Note (Signed)
Pharmacy Antibiotic Note ? ?[name] is a 66 y.o. male with medical history including HTN, aortic valve stenosis, HLD, GERD admitted on 10/11/2021 with  right sided neck pain associated with right sided HA and vision changes . Imaging with right mastoid and inner ear effusions which may reflect early mastoiditis. Pharmacy has been consulted for Unasyn dosing. ? ?Plan: ? ?Unasyn 3 g IV q6h ? ?Height: 6' (182.9 cm) ?Weight: 71.7 kg (158 lb) ?IBW/kg (Calculated) : 77.6 ? ?Temp (24hrs), Avg:98.6 ?F (37 ?C), Min:98.6 ?F (37 ?C), Max:98.6 ?F (37 ?C) ? ?Recent Labs  ?Lab 10/11/21 ?1019  ?WBC 14.8*  ?CREATININE 1.20  ?  ?Estimated Creatinine Clearance: 63.1 mL/min (by C-G formula based on SCr of 1.2 mg/dL).   ? ?No Known Allergies ? ?Antimicrobials this admission: ?Unasyn 4/3 >>  ? ?Dose adjustments this admission: ?N/A ? ?Microbiology results: ?4/3 BCx: pending ?4/3 MRSA PCR: pending ? ?Thank you for allowing pharmacy to be a part of this patient?s care. ? ?Benita Gutter ?10/11/2021 4:01 PM ? ?

## 2021-10-11 NOTE — ED Triage Notes (Signed)
Pt c//o right sided neck pain for the past couple of days, pt c/o head ache, "hurts to touch it" for the past week with right eye pain and blurred vision.Marland Kitchen ?

## 2021-10-11 NOTE — ED Notes (Signed)
Pt is resting at present  states pain is gone  requesting PO fluids   family at bedside ? ?

## 2021-10-11 NOTE — ED Provider Notes (Signed)
? ?[facility] ?Provider Note ? ? ? Event Date/Time  ? First MD Initiated Contact with Patient 10/11/21 [phone]   ?  (approximate) ? ? ?History  ? ?Headache and Neck Pain ? ? ?HPI ? ?Romulo Okray is a 66 y.o. male with a past history of hypertension who comes ED complaining of right-sided neck pain radiating up into the head for the past 2 days.  Started abruptly when he was waking up from a nap, has been continuous.  Worse with movement and neck rotation.  Associated with blurry vision.  No peripheral motor weakness or paresthesia.  No trauma.  No fever.  No change in balance or coordination.  He was seen in walk-in clinic this morning where he was noted to have right eye vision loss and sent to the ED for further evaluation. ? ? ?  ? ? ?Physical Exam  ? ?Triage Vital Signs: ?ED Triage Vitals  ?Enc Vitals Group  ?   BP 10/11/21 0908 (!) 152/63  ?   Pulse Rate 10/11/21 0908 72  ?   Resp 10/11/21 0908 18  ?   Temp 10/11/21 0908 98.6 ?F (37 ?C)  ?   Temp Source 10/11/21 0908 Oral  ?   SpO2 10/11/21 0908 97 %  ?   Weight 10/11/21 0909 158 lb (71.7 kg)  ?   Height 10/11/21 0909 6' (1.829 m)  ?   Head Circumference --   ?   Peak Flow --   ?   Pain Score 10/11/21 0909 5  ?   Pain Loc --   ?   Pain Edu? --   ?   Excl. in [place]? --   ? ? ?Most recent vital signs: ?Vitals:  ? 10/11/21 1100 10/11/21 1305  ?BP: 124/65 110/60  ?Pulse: 83 80  ?Resp:  16  ?Temp:    ?SpO2: 98% 98%  ? ? ? ?General: Awake, no distress.  ?CV:  Good peripheral perfusion.  Regular rate and rhythm ?Resp:  Normal effort.  Clear to auscultation bilaterally ?Abd:  No distention.  ?Other:  There is monocular right eye right visual field loss.  Left eye visual fields are normal.  Extraocular movements are intact, pupils are symmetric at 2 mm bilaterally.  No photophobia.  Globes are soft to the touch and symmetric.  There is slight hazy erythema of the right bulbar conjunctiva.  Cranial nerves III through XII are intact.  Peripheral motor  and sensation are intact.  Coordination is intact. ? ?There is diffuse tenderness and tenseness along the right paraspinous musculature of the neck. ? ? ?ED Results / Procedures / Treatments  ? ?Labs ?(all labs ordered are listed, but only abnormal results are displayed) ?Labs Reviewed  ?BASIC METABOLIC PANEL - Abnormal; Notable for the following components:  ?    Result Value  ? Sodium 131 (*)   ? Chloride 94 (*)   ? Glucose, Bld 111 (*)   ? BUN 24 (*)   ? Calcium 8.3 (*)   ? All other components within normal limits  ?CBC WITH DIFFERENTIAL/PLATELET - Abnormal; Notable for the following components:  ? WBC 14.8 (*)   ? Hemoglobin 12.4 (*)   ? HCT 37.3 (*)   ? Neutro Abs 13.4 (*)   ? Lymphs Abs 0.5 (*)   ? Abs Immature Granulocytes 0.08 (*)   ? All other components within normal limits  ?C-REACTIVE PROTEIN  ?SEDIMENTATION RATE  ? ? ? ?EKG ? ? ? ? ?  RADIOLOGY ?CT head images independently reviewed by me, negative for ICH, mass, or ocular abnormality. Radiology report reviewed. ? ? ? ?PROCEDURES: ? ?Critical Care performed: No ? ?Procedures ? ? ?MEDICATIONS ORDERED IN ED: ?Medications  ?sodium chloride 0.9 % bolus 1,000 mL (0 mLs Intravenous Stopped 10/11/21 1318)  ?metoCLOPramide (REGLAN) injection 10 mg (10 mg Intravenous Given 10/11/21 1021)  ?diphenhydrAMINE (BENADRYL) injection 25 mg (25 mg Intravenous Given 10/11/21 1021)  ?ketorolac (TORADOL) 15 MG/ML injection 15 mg (15 mg Intravenous Given 10/11/21 1022)  ?iohexol (OMNIPAQUE) 350 MG/ML injection 75 mL (75 mLs Intravenous Contrast Given 10/11/21 1105)  ? ? ? ?IMPRESSION / MDM / ASSESSMENT AND PLAN / ED COURSE  ?I reviewed the triage vital signs and the nursing notes. ?             ?               ? ?Differential diagnosis includes, but is not limited to, vertebral dissection, cerebral aneurysm, prechiasmatic lesion, optic neuritis, muscle strain, complicated migraine ? ?Patient presents with right neck and head pain along with right eye visual field loss.  Will obtain CT  angiogram of the head and neck and labs along with giving medications for possible complicated migraine. I doubt CRAO, CRVO, retinal detachment, glaucoma, or ocular/orbital infection.  We will plan to hospitalize for further stroke work-up. ? ?----------------------------------------- ?1:40 PM on 10/11/2021 ?----------------------------------------- ?CTA head/neck unremarkable. Pt still has persistent symptoms, no improvement with migraine cocktail given. Still has visual field loss. Case d/w hospitalist for further evaluation/management of possible subacute stroke syndrome.   ? ? ?Clinical Course as of 10/11/21 1551  ?Mon Oct 11, 2021  ?1550 IOP by tonopen 26. Bedside POCUS performed by me of right eye negative for retinal detachment. [PS]  ?  ?Clinical Course User Index ?[PS] Carrie Mew, MD  ? ? ? ?FINAL CLINICAL IMPRESSION(S) / ED DIAGNOSES  ? ?Final diagnoses:  ?Visual field defect of right eye  ?Neck pain  ? ? ? ?Rx / DC Orders  ? ?ED Discharge Orders   ? ? None  ? ?  ? ? ? ?Note:  This document was prepared using Dragon voice recognition software and may include unintentional dictation errors. ?  ?Carrie Mew, MD ?10/11/21 1342 ? ?

## 2021-10-11 NOTE — ED Notes (Signed)
See triage note  presents with pain to right side of neck  pt was sent over by [facility]   states pain started couple of days ago   pain with touch and movement  denies any trauma or fever ?

## 2021-10-11 NOTE — ED Notes (Signed)
Pt medicated per [facility] for pain. Pt given ginger ale per request. Lights dimmed per request for comfort. No additional needs verbalized at this time. Bed low & locked; call light & personal items within reach. ?

## 2021-10-11 NOTE — ED Notes (Signed)
ECHO at BS.

## 2021-10-11 NOTE — ED Notes (Signed)
Pt ambulated independently to restroom & back to pt room without difficulty, steady gait. ?

## 2021-10-11 NOTE — H&P (Addendum)
?History and Physical  ? ? ?[name] VHQ:[id number] DOB: September 15, 1955 DOA: 10/11/2021 ? ?PCP: [place]  ?Patient coming from: [facility] ? ?I have personally briefly reviewed patient's old medical records in [place] ? ?Chief Complaint: right sided neck pain, HA, right side vision loss  ? ?HPI: [name] is a 66 y.o. male with medical history significant for HTN,Aortic valve stensis, HLD,GERD who presents to ed with complaint of right sided neck pain associated with HA as well as right sided vision changes x 2 days. He was seen in [facility] clinic for these complaints to day and referred to ED. ?He denies any focal weakness, confusion, paresthesias, difficulty with speech, temporal tenderness, jaw claudication or  fever/chills. He however does endorse cold cough symptoms that started around 2 weeks ago which have almost completely resolved.  He denies any falls, or trauma. He notes that he has had blurry vision in the past but never this severe. On further ros he  denies n/v/d /chest pain or sob. ? ? ?ED Course:  ?IN ED patient vitals:  ? ?Labs: ?14.8, hgb12.4, plt267, pmn13.4  ?NA131, K 3.8, cl 94, gly 111, cr 1.2 ? ?Tx regaln ,benadryl, ns , ketoralac ?CTA ?IMPRESSION: ?1. No evidence of acute intracranial pathology. ?2. Mild calcified atherosclerotic plaque of the bilateral carotid ?bifurcations without hemodynamically significant stenosis. Patent ?vertebral arteries. ?3. Atherosclerotic plaque in the bilateral intracranial ICAs ?resulting in mild-to-moderate stenosis of the right supraclinoid ?segment and no greater than mild stenosis on the left. Otherwise, ?patent intracranial vasculature. ?4. Right mastoid and middle ear effusions. The right nasopharynx is ?grossly unremarkable. ?5. Aortic Atherosclerosis (ICD10-I70.0) and Emphysema  ?Review of Systems: As per HPI otherwise 10 point review of systems negative.  ? ?Past Medical History:  ?Diagnosis Date  ? Hypertension   ?Aortic valve  stenosis ?HLD ?GERD ? ?History reviewed. No pertinent surgical history. ? ? reports that he has been smoking cigarettes. He has a 46.00 pack-year smoking history. He does not have any smokeless tobacco history on file. He reports current alcohol use. No history on file for drug use. ? ?No Known Allergies ?Family History  ?Problem Relation Age of Onset  ? High blood pressure (Hypertension) Other  ? Colon polyps Mother  ? Hernia Mother  ? Diabetes type II Mother  ? Asthma Mother  ? High blood pressure (Hypertension) Mother  ? Colon cancer Sister  ? High blood pressure (Hypertension) Sister  ? Diabetes type II Father  ? Prostatitis Father  ? High blood pressure (Hypertension) Father  ? High blood pressure (Hypertension) Brother  ? High blood pressure (Hypertension) Brother  ? High blood pressure (Hypertension) Sister  ? ?Prior to Admission medications   ?Medication Sig Start Date End Date Taking? Authorizing Provider  ?amLODipine (NORVASC) 10 MG tablet Take 10 mg by mouth daily.   Yes [provider]  ?aspirin EC 81 MG tablet Take 81 mg by mouth daily. Swallow whole.   Yes [provider]  ?esomeprazole (NEXIUM) 40 MG capsule Take 40 mg by mouth daily at 12 noon.   Yes [provider]  ?lisinopril (ZESTRIL) 5 MG tablet Take 5 mg by mouth daily.   Yes [provider]  ? ? ?Physical Exam: ?Vitals:  ? 10/11/21 0909 10/11/21 1057 10/11/21 1100 10/11/21 1305  ?BP:  124/65 124/65 110/60  ?Pulse:  80 83 80  ?Resp:  18  16  ?Temp:      ?TempSrc:      ?SpO2:  98%  98% 98%  ?Weight: 71.7 kg     ?Height: 6' (1.829 m)     ? ? ? ?Vitals:  ? 10/11/21 0909 10/11/21 1057 10/11/21 1100 10/11/21 1305  ?BP:  124/65 124/65 110/60  ?Pulse:  80 83 80  ?Resp:  18  16  ?Temp:      ?TempSrc:      ?SpO2:  98% 98% 98%  ?Weight: 71.7 kg     ?Height: 6' (1.829 m)     ?Constitutional: NAD, calm, comfortable ?Eyes: PERRL, lids and conjunctivae normal ?ENMT: Mucous membranes are moist. Posterior pharynx clear of any  exudate or lesions.Normal dentition.  ?Neck: normal, supple, no masses, no thyromegaly ?Respiratory: clear to auscultation bilaterally, no wheezing, no crackles. Normal respiratory effort. No accessory muscle use.  ?Cardiovascular: Regular rate and rhythm, + murmurs  no rubs / gallops. No extremity edema. 2+ pedal pulses. No carotid bruits.  ?Abdomen: no tenderness, no masses palpated. No hepatosplenomegaly. Bowel sounds positive.  ?Musculoskeletal: no clubbing / cyanosis. No joint deformity upper and lower extremities. Good ROM, no contractures. Normal muscle tone.  Tenderness later right neck  no discrete muscle spasm  ?Skin: no rashes, lesions, ulcers. No induration ?Neurologic: CN 2-12 grossly intact. Sensation intact, DTR normal. Strength 5/5 in all 4.  ?Psychiatric: Normal judgment and insight. Alert and oriented x 3. Normal mood.  ? ? ?Labs on Admission: I have personally reviewed following labs and imaging studies ? ?CBC: ?Recent Labs  ?Lab 10/11/21 ?1019  ?WBC 14.8*  ?NEUTROABS 13.4*  ?HGB 12.4*  ?HCT 37.3*  ?MCV 86.7  ?PLT 267  ? ?Basic Metabolic Panel: ?Recent Labs  ?Lab 10/11/21 ?1019  ?NA 131*  ?K 3.8  ?CL 94*  ?CO2 28  ?GLUCOSE 111*  ?BUN 24*  ?CREATININE 1.20  ?CALCIUM 8.3*  ? ?GFR: ?Estimated Creatinine Clearance: 63.1 mL/min (by C-G formula based on SCr of 1.2 mg/dL). ?Liver Function Tests: ?No results for input(s): AST, ALT, ALKPHOS, BILITOT, PROT, ALBUMIN in the last 168 hours. ?No results for input(s): LIPASE, AMYLASE in the last 168 hours. ?No results for input(s): AMMONIA in the last 168 hours. ?Coagulation Profile: ?No results for input(s): INR, PROTIME in the last 168 hours. ?Cardiac Enzymes: ?No results for input(s): CKTOTAL, CKMB, CKMBINDEX, TROPONINI in the last 168 hours. ?BNP (last 3 results) ?No results for input(s): PROBNP in the last 8760 hours. ?HbA1C: ?No results for input(s): HGBA1C in the last 72 hours. ?CBG: ?No results for input(s): GLUCAP in the last 168 hours. ?Lipid  Profile: ?No results for input(s): CHOL, HDL, LDLCALC, TRIG, CHOLHDL, LDLDIRECT in the last 72 hours. ?Thyroid Function Tests: ?No results for input(s): TSH, T4TOTAL, FREET4, T3FREE, THYROIDAB in the last 72 hours. ?Anemia Panel: ?No results for input(s): VITAMINB12, FOLATE, FERRITIN, TIBC, IRON, RETICCTPCT in the last 72 hours. ?Urine analysis: ?No results found for: COLORURINE, APPEARANCEUR, [place], [place], [place], [place], [place], KETONESUR, PROTEINUR, [address], NITRITE, LEUKOCYTESUR ? ?Radiological Exams on Admission: ?CT ANGIO HEAD NECK W WO CM ? ?Result Date: 10/11/2021 ?CLINICAL DATA:  Right-sided neck pain for a few days, headache blurry vision and pain in the right eye EXAM: CT ANGIOGRAPHY HEAD AND NECK TECHNIQUE: Multidetector CT imaging of the head and neck was performed using the standard protocol during bolus administration of intravenous contrast. Multiplanar CT image reconstructions and MIPs were obtained to evaluate the vascular anatomy. Carotid stenosis measurements (when applicable) are obtained utilizing NASCET criteria, using the distal internal carotid diameter as the denominator. RADIATION DOSE REDUCTION: This exam was performed according  to the departmental dose-optimization program which includes automated exposure control, adjustment of the mA and/or kV according to patient size and/or use of iterative reconstruction technique. CONTRAST:  94m OMNIPAQUE IOHEXOL 350 MG/ML SOLN COMPARISON:  Carotid Doppler 02/24/2011 FINDINGS: CT HEAD FINDINGS Brain: There is no evidence of acute intracranial hemorrhage, extra-axial fluid collection, or acute infarct. Parenchymal volume is normal. The ventricles are normal in size. Gray-white differentiation is preserved. There is no mass lesion. There is no mass effect or midline shift. Vascular: See below. Skull: Normal. Negative for fracture or focal lesion. Sinuses: There is mild mucosal thickening in the paranasal sinuses. Orbits: The globes and  orbits are unremarkable. There are no inflammatory changes identified in the right orbit. Other: There are right mastoid and middle ear effusions. The right nasopharynx is grossly unremarkable. Review of the MIP images con

## 2021-10-11 NOTE — Progress Notes (Signed)
SLP Cancellation Note ? ?Patient Details ?Name: [name] ?MRN: [id number] ?DOB: May 10, 1956 ? ? ?Cancelled treatment:       Reason Eval/Treat Not Completed: SLP screened, no needs identified, will sign off (chart reviewed; consulted NSG then met w/ pt in room) ?Pt denied any difficulty swallowing and is currently on a regular diet; tolerates swallowing pills w/ water per NSG. Pt was eating his Dinner meal feeding himself (opening packages, containers) w/out difficulty reported. Pt conversed in conversation w/out overt expressive/receptive deficits noted; pt denied any speech-language deficits. Speech clear. ?No further skilled ST services indicated as pt appears at his baseline. Pt agreed. NSG to reconsult if any change in status while admitted.   ? ? ? ? ?Orinda Kenner, MS, CCC-SLP ?Speech Language Pathologist ?Re[facility] Services; [place] ?[phone] (ascom) ?[name] ?10/11/2021, 4:48 PM ?

## 2021-10-11 NOTE — Progress Notes (Signed)
PT Cancellation Note ? ?Patient Details ?Name: [name] ?MRN: [id number] ?DOB: 01-20-1956 ? ? ?Cancelled Treatment:    Reason Eval/Treat Not Completed: Patient at procedure or test/unavailable PT orders received, chart reviewed. Pt noted to be OTF at MRI at this time. Will f/u as able & as pt is available. ? ? ?Lavone Nian, PT, DPT ?10/11/21, 3:45 PM ? ?[name] ?10/11/2021, 3:44 PM ?

## 2021-10-11 NOTE — ED Notes (Signed)
Pt up to recliner at this time. Pt states he feels better sitting up in the recliner than laying in bed at this time.  ?

## 2021-10-11 NOTE — ED Notes (Signed)
Pt asking for something for his neck pain/stiffness. Secure msg sent to Dr. Marcello Moores. ?

## 2021-10-12 ENCOUNTER — Encounter: Payer: Self-pay | Admitting: Internal Medicine

## 2021-10-12 DIAGNOSIS — I639 Cerebral infarction, unspecified: Secondary | ICD-10-CM | POA: Diagnosis present

## 2021-10-12 DIAGNOSIS — H44111 Panuveitis, right eye: Secondary | ICD-10-CM

## 2021-10-12 DIAGNOSIS — M5144 Schmorl's nodes, thoracic region: Secondary | ICD-10-CM | POA: Diagnosis not present

## 2021-10-12 DIAGNOSIS — H539 Unspecified visual disturbance: Secondary | ICD-10-CM | POA: Diagnosis not present

## 2021-10-12 DIAGNOSIS — M542 Cervicalgia: Secondary | ICD-10-CM | POA: Diagnosis not present

## 2021-10-12 DIAGNOSIS — H5461 Unqualified visual loss, right eye, normal vision left eye: Secondary | ICD-10-CM | POA: Diagnosis not present

## 2021-10-12 DIAGNOSIS — H209 Unspecified iridocyclitis: Secondary | ICD-10-CM

## 2021-10-12 DIAGNOSIS — Z8679 Personal history of other diseases of the circulatory system: Secondary | ICD-10-CM | POA: Diagnosis not present

## 2021-10-12 LAB — CBC WITH DIFFERENTIAL/PLATELET
Abs Immature Granulocytes: 0.16 10*3/uL — ABNORMAL HIGH (ref 0.00–0.07)
Basophils Absolute: 0.1 10*3/uL (ref 0.0–0.1)
Basophils Relative: 0 %
Eosinophils Absolute: 0.1 10*3/uL (ref 0.0–0.5)
Eosinophils Relative: 1 %
HCT: 39.8 % (ref 39.0–52.0)
Hemoglobin: 13.4 g/dL (ref 13.0–17.0)
Immature Granulocytes: 1 %
Lymphocytes Relative: 9 %
Lymphs Abs: 1.6 10*3/uL (ref 0.7–4.0)
MCH: 29.3 pg (ref 26.0–34.0)
MCHC: 33.7 g/dL (ref 30.0–36.0)
MCV: 86.9 fL (ref 80.0–100.0)
Monocytes Absolute: 1 10*3/uL (ref 0.1–1.0)
Monocytes Relative: 6 %
Neutro Abs: 14.8 10*3/uL — ABNORMAL HIGH (ref 1.7–7.7)
Neutrophils Relative %: 83 %
Platelets: 315 10*3/uL (ref 150–400)
RBC: 4.58 MIL/uL (ref 4.22–5.81)
RDW: 15.2 % (ref 11.5–15.5)
WBC: 17.7 10*3/uL — ABNORMAL HIGH (ref 4.0–10.5)
nRBC: 0 % (ref 0.0–0.2)

## 2021-10-12 LAB — ECHOCARDIOGRAM COMPLETE
AR max vel: 0.95 cm2
AV Area VTI: 1.07 cm2
AV Area mean vel: 0.87 cm2
AV Mean grad: 33.5 mmHg
AV Peak grad: 56 mmHg
Ao pk vel: 3.74 m/s
Area-P 1/2: 4.24 cm2
Height: 72 in
MV VTI: 1.9 cm2
S' Lateral: 2.79 cm
Weight: 2528 oz

## 2021-10-12 LAB — BASIC METABOLIC PANEL
Anion gap: 8 (ref 5–15)
BUN: 22 mg/dL (ref 8–23)
CO2: 28 mmol/L (ref 22–32)
Calcium: 8 mg/dL — ABNORMAL LOW (ref 8.9–10.3)
Chloride: 95 mmol/L — ABNORMAL LOW (ref 98–111)
Creatinine, Ser: 1.18 mg/dL (ref 0.61–1.24)
GFR, Estimated: >60 mL/min (ref >60)
Glucose, Bld: 116 mg/dL — ABNORMAL HIGH (ref 70–99)
Potassium: 4.3 mmol/L (ref 3.5–5.1)
Sodium: 131 mmol/L — ABNORMAL LOW (ref 135–145)

## 2021-10-12 LAB — SEDIMENTATION RATE: Sed Rate: 53 mm/hr — ABNORMAL HIGH (ref 0–20)

## 2021-10-12 LAB — HEPATIC FUNCTION PANEL
ALT: 177 U/L — ABNORMAL HIGH (ref 0–44)
AST: 94 U/L — ABNORMAL HIGH (ref 15–41)
Albumin: 2.6 g/dL — ABNORMAL LOW (ref 3.5–5.0)
Alkaline Phosphatase: 235 U/L — ABNORMAL HIGH (ref 38–126)
Bilirubin, Direct: 0.3 mg/dL — ABNORMAL HIGH (ref 0.0–0.2)
Indirect Bilirubin: 0.4 mg/dL (ref 0.3–0.9)
Total Bilirubin: 0.7 mg/dL (ref 0.3–1.2)
Total Protein: 6.8 g/dL (ref 6.5–8.1)

## 2021-10-12 LAB — LIPID PANEL
Cholesterol: 132 mg/dL (ref 0–200)
HDL: 17 mg/dL — ABNORMAL LOW (ref >40)
LDL Cholesterol: 90 mg/dL (ref 0–99)
Total CHOL/HDL Ratio: 7.8 RATIO
Triglycerides: 124 mg/dL (ref <150)
VLDL: 25 mg/dL (ref 0–40)

## 2021-10-12 LAB — PROCALCITONIN: Procalcitonin: 1.22 ng/mL

## 2021-10-12 MED ORDER — ATORVASTATIN CALCIUM 80 MG PO TABS: 80.0000 mg | ORAL_TABLET | Freq: Every day | ORAL | Status: DC

## 2021-10-12 MED ORDER — ETOMIDATE 2 MG/ML IV SOLN
INTRAVENOUS | Status: AC
  Filled 2021-10-12: qty 10

## 2021-10-12 MED ORDER — SODIUM CHLORIDE 0.9 % IV SOLN: 3.0000 g | Freq: Four times a day (QID) | INTRAVENOUS | Status: DC

## 2021-10-12 MED ORDER — PREDNISOLONE ACETATE 1 % OP SUSP
1.0000 [drp] | Freq: Every day | OPHTHALMIC | Status: DC
  Administered 2021-10-12 (×3): 1 [drp] via OPHTHALMIC
  Filled 2021-10-12: qty 5

## 2021-10-12 MED ORDER — SODIUM CHLORIDE 0.9 % IV SOLN
2.0000 g | INTRAVENOUS | Status: DC
  Administered 2021-10-12: 2 g via INTRAVENOUS
  Filled 2021-10-12: qty 20

## 2021-10-12 MED ORDER — ATORVASTATIN CALCIUM 20 MG PO TABS
80.0000 mg | ORAL_TABLET | Freq: Every day | ORAL | Status: DC
  Administered 2021-10-12: 80 mg via ORAL
  Filled 2021-10-12: qty 4

## 2021-10-12 MED ORDER — OXYCODONE HCL 5 MG PO TABS
5.0000 mg | ORAL_TABLET | Freq: Four times a day (QID) | ORAL | Status: DC | PRN
  Administered 2021-10-12 (×2): 5 mg via ORAL
  Filled 2021-10-12 (×2): qty 1

## 2021-10-12 MED ORDER — ROCURONIUM BROMIDE 10 MG/ML (PF) SYRINGE
PREFILLED_SYRINGE | INTRAVENOUS | Status: AC
  Filled 2021-10-12: qty 10

## 2021-10-12 MED ORDER — PREDNISOLONE ACETATE 1 % OP SUSP: 1.0000 [drp] | Freq: Every day | OPHTHALMIC | Status: AC

## 2021-10-12 MED ORDER — ROCURONIUM BROMIDE 10 MG/ML (PF) SYRINGE
PREFILLED_SYRINGE | INTRAVENOUS | Status: AC
Start: 2021-10-12 — End: 2021-10-12
  Filled 2021-10-12: qty 10

## 2021-10-12 NOTE — Discharge Summary (Signed)
? ?Physician Discharge Summary  ?[name] ASN:[id number] DOB: 25-Apr-1956 DOA: 10/11/2021 ? ?PCP: [place] ? ?Admit date: 10/11/2021 ?Discharge date: 10/12/2021 ? ?Discharge disposition: [facility] ? ? ?Recommendations for Outpatient Follow-Up:  ? ?Follow-up with hospitalist and ophthalmologist at [facility] ? ? ?Discharge Diagnosis:  ? ?Principal Problem: ?  CVA (cerebral vascular accident) ([place]) ?Active Problems: ?  Vision loss of right eye ? ? ? ?Discharge Condition: Stable. ? ?Diet recommendation:  ?Diet Order   ? ?       ?  Diet - low sodium heart healthy       ?  ?  Diet Heart Room service appropriate? Yes; Fluid consistency: Thin  Diet effective now       ?  ? ?  ?  ? ?  ? ? ?  Code Status: Full Code ? ? ? ? ?Hospital Course:  ? ?[name] is a 66 y.o. male with medical history significant for hypertension, aortic stenosis, hyperlipidemia, GERD, who presented to the hospital with right-sided neck pain, headache and blurry vision in the right eye.  MRI brain showed changes consistent with acute/subacute lacunar infarct.  MRI of the cervical spine showed bone marrow edema at the endplates of T1 and T2 with findings that could be related to degenerative disc disease, possibly from early stages of discitis/osteomyelitis.  He was initially started on IV Unasyn for right mastoid and middle ear effusion but this was discontinued.  Outpatient follow-up with ENT was recommended.. ? ?He was evaluated by Dr. Edison Pace, ophthalmologist, for vision loss in the right eye.  Dr. Edison Pace recommended transfer to a tertiary care center for further management of vision loss in the right eye.  Case was discussed with Dr. Jeb Levering, hospitalist at [facility].  He has graciously accepted the patient in transfer. ? ? ? ?Assessment and plan ? ? ?Acute/subacute stroke lacunar infarct: Continue aspirin.  Start Lipitor.  Neurologist has been consulted. ?  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 Benay Pillow, ophthalmologist.  He recommended transfer to a tertiary care center for further evaluation preferably by a retinal specialist.  Patient has been accepted in transfer at [facility]. ?  ?Degenerative disc disease cervical spine, right-sided neck pain, bone marrow edema of endplates of T1 and T2,  possible degenerative disc disease with a superior endplate Schmorl's node at T2, possible early stages of discitis/osteomyelitis: He may have to be evaluated by neurosurgeon and infectious disease specialist at [facility]. ? ?Right mastoid and middle ear effusions: Outpatient follow-up with ENT recommended. ? ? ?Medical Consultants:  ? ?Neurologist ?Infectious disease ?Neurosurgeon ? ? ?Discharge Exam:  ? ? ?Vitals:  ? 10/12/21 0610 10/12/21 0800 10/12/21 0900 10/12/21 1345  ?BP: 138/65 133/60 (!) 128/57 122/68  ?Pulse: 75 81 74 79  ?Resp: 18 (!) '24 18 20  '$ ?Temp:    97.8 ?F (36.6 ?C)  ?TempSrc:    Oral  ?SpO2: 92% 92% 95% 93%  ?Weight:      ?Height:      ? ? ? ?GEN: NAD ?SKIN: Warm and dry ?EYES: EOMI, PERRLA, impaired vision in right eye ?ENT: MMM ?CV: RRR ?PULM: CTA B ?ABD: soft, ND, NT, +BS ?CNS: AAO x 3, non focal ?EXT: No edema or tenderness ?MSK: mild tenderness on the right side of posterior neck ? ? ?The results of significant diagnostics from this hospitalization (including imaging, microbiology, ancillary and laboratory) are listed below for reference.   ? ? ?  Procedures and Diagnostic Studies:  ? ?CT ANGIO HEAD NECK W WO CM ? ?Result Date: 10/11/2021 ?CLINICAL DATA:  Right-sided neck pain for a few days, headache blurry vision and pain in the right eye EXAM: CT ANGIOGRAPHY HEAD AND NECK TECHNIQUE: Multidetector CT imaging of the head and neck was performed using the standard protocol during bolus administration of intravenous contrast. Multiplanar CT image reconstructions and MIPs were obtained to evaluate the vascular anatomy. Carotid stenosis measurements (when  applicable) are obtained utilizing NASCET criteria, using the distal internal carotid diameter as the denominator. RADIATION DOSE REDUCTION: This exam was performed according to the departmental dose-optimization program which includes automated exposure control, adjustment of the mA and/or kV according to patient size and/or use of iterative reconstruction technique. CONTRAST:  51m OMNIPAQUE IOHEXOL 350 MG/ML SOLN COMPARISON:  Carotid Doppler 02/24/2011 FINDINGS: CT HEAD FINDINGS Brain: There is no evidence of acute intracranial hemorrhage, extra-axial fluid collection, or acute infarct. Parenchymal volume is normal. The ventricles are normal in size. Gray-white differentiation is preserved. There is no mass lesion. There is no mass effect or midline shift. Vascular: See below. Skull: Normal. Negative for fracture or focal lesion. Sinuses: There is mild mucosal thickening in the paranasal sinuses. Orbits: The globes and orbits are unremarkable. There are no inflammatory changes identified in the right orbit. Other: There are right mastoid and middle ear effusions. The right nasopharynx is grossly unremarkable. Review of the MIP images confirms the above findings CTA NECK FINDINGS Aortic arch: There is minimal calcified atherosclerotic plaque of the aortic arch. The origins of the major branch vessels are patent. There is a common origin of the brachiocephalic and left common carotid arteries, a normal variant. The subclavian arteries are patent to the level imaged. Right carotid system: The right common, internal, and external carotid arteries are patent, without hemodynamically significant stenosis or occlusion. There is mild plaque at the bifurcation. There is no evidence of dissection or aneurysm. Left carotid system: The left common, internal, and external carotid arteries are patent, without hemodynamically significant stenosis or occlusion. There is mild plaque at the bifurcation. There is no evidence of  dissection or aneurysm. Vertebral arteries: The vertebral arteries are patent, without hemodynamically significant stenosis or occlusion. There is no evidence of dissection or aneurysm. Skeleton: There is mild degenerative change of the cervical spine, most advanced at C6-C7. There is a prominent Schmorl's node indenting the superior T2 endplate. Other neck: The soft tissues are unremarkable. Upper chest: There is mild emphysema in the lung apices. Review of the MIP images confirms the above findings CTA HEAD FINDINGS Anterior circulation: There is calcified atherosclerotic plaque in the bilateral intracranial ICAs resulting in mild-to-moderate stenosis of the right supraclinoid segment and no greater than mild stenosis on the left. The bilateral MCAs are patent The bilateral ACAs are patent. The anterior communicating artery is normal. There is no aneurysm or AVM. Posterior circulation: The bilateral V4 segments are patent. The basilar artery is patent. The bilateral PCAs are patent. There is a fetal origin of the right PCA. The left posterior communicating artery is not identified. There is no aneurysm or AVM. Venous sinuses: Patent. Anatomic variants: As above. Review of the MIP images confirms the above findings IMPRESSION: 1. No evidence of acute intracranial pathology. 2. Mild calcified atherosclerotic plaque of the bilateral carotid bifurcations without hemodynamically significant stenosis. Patent vertebral arteries. 3. Atherosclerotic plaque in the bilateral intracranial ICAs resulting in mild-to-moderate stenosis of the right supraclinoid segment and no greater than mild  stenosis on the left. Otherwise, patent intracranial vasculature. 4. Right mastoid and middle ear effusions. The right nasopharynx is grossly unremarkable. 5. Aortic Atherosclerosis (ICD10-I70.0) and Emphysema (ICD10-J43.9). Electronically Signed   By: Valetta Mole M.D.   On: 10/11/2021 11:32  ? ?DG Chest 2 View ? ?Result Date:  10/11/2021 ?CLINICAL DATA:  TIA EXAM: CHEST - 2 VIEW COMPARISON:  No prior chest radiograph, correlation is made with 07/30/2020 CT chest FINDINGS: Cardiac and mediastinal contours are within normal limits. No focal pulmona

## 2021-10-12 NOTE — ED Notes (Signed)
Pt refused scd'd  ?

## 2021-10-12 NOTE — Consult Note (Signed)
?Reason for Consult: subacute vision loss and field cut, right eye ?Referring Physician: Dr. Myles Rosenthal, MD ?Chief complaint: neck pain ? ?HPI: [name] is an 66 y.o. male with PMH significant for aortic stenosis, HTN, HLD, atherosclerosis, tobacco and cocaine abuse who presented to the walk in clinic for neck pain.  He also complained of blurry vision in the right eye and was referred to the ED.  Head and neck imaging has been performed. ?He is a poor historian and is accompanied by his wife.   ? ?He has no significant past ocular history, but complains that about 2-3 weeks ago he had some intermittent vision loss in the right eye which lasted for a few hours and would spontaneously resolve.  Then for the past few days he complains of some eye soreness and blurry vision.  He is unsure of the duration or degree of vision loss. ? ?He denies flashes of light, floaters or pain with eye movement.  Left eye seems normal. ? ? ?Past Medical History:  ?Diagnosis Date  ? Hypertension   ? ? ?ROS ? ?History reviewed. No pertinent surgical history.  No previous ocular surgeries.  Last eye exam about 2018. ? ?No family history on file. ? ?Social History:  reports that he has been smoking cigarettes. He has a 46.00 pack-year smoking history. He does not have any smokeless tobacco history on file. He reports current alcohol use. No history on file for drug use. ? ?Allergies: No Known Allergies ? ?Prior to Admission medications   ?Medication Sig Start Date End Date Taking? Authorizing Provider  ?amLODipine (NORVASC) 10 MG tablet Take 10 mg by mouth daily.   Yes [provider]  ?Multiple Vitamins-Minerals (MULTIVITAMIN WITH MINERALS) tablet Take 1 tablet by mouth daily.   Yes [provider]  ?aspirin EC 81 MG tablet Take 81 mg by mouth daily. Swallow whole. ?Patient not taking: Reported on 10/11/2021    [provider]  ?esomeprazole (NEXIUM) 40 MG capsule Take 40 mg by mouth daily at 12  noon. ?Patient not taking: Reported on 10/11/2021    [provider]  ? ? ?Results for orders placed or performed during the hospital encounter of 10/11/21 (from the past 48 hour(s))  ?Basic metabolic panel     Status: Abnormal  ? Collection Time: 10/11/21 10:19 AM  ?Result Value Ref Range  ? Sodium 131 (L) 135 - 145 mmol/L  ? Potassium 3.8 3.5 - 5.1 mmol/L  ? Chloride 94 (L) 98 - 111 mmol/L  ? CO2 28 22 - 32 mmol/L  ? Glucose, Bld 111 (H) 70 - 99 mg/dL  ?  Comment: Glucose reference range applies only to samples taken after fasting for at least 8 hours.  ? BUN 24 (H) 8 - 23 mg/dL  ? Creatinine, Ser 1.20 0.61 - 1.24 mg/dL  ? Calcium 8.3 (L) 8.9 - 10.3 mg/dL  ? GFR, Estimated >60 >60 mL/min  ?  Comment: (NOTE) ?Calculated using the CKD-EPI Creatinine Equation (2021) ?  ? Anion gap 9 5 - 15  ?  Comment: Performed at [facility], [address] [place], [place] [address]  ?CBC with Differential     Status: Abnormal  ? Collection Time: 10/11/21 10:19 AM  ?Result Value Ref Range  ? WBC 14.8 (H) 4.0 - 10.5 K/uL  ? RBC 4.30 4.22 - 5.81 MIL/uL  ? Hemoglobin 12.4 (L) 13.0 - 17.0 g/dL  ? HCT 37.3 (L) 39.0 - 52.0 %  ? MCV  86.7 80.0 - 100.0 fL  ? MCH 28.8 26.0 - 34.0 pg  ? MCHC 33.2 30.0 - 36.0 g/dL  ? RDW 15.0 11.5 - 15.5 %  ? Platelets 267 150 - 400 K/uL  ? nRBC 0.0 0.0 - 0.2 %  ? Neutrophils Relative % 91 %  ? Neutro Abs 13.4 (H) 1.7 - 7.7 K/uL  ? Lymphocytes Relative 4 %  ? Lymphs Abs 0.5 (L) 0.7 - 4.0 K/uL  ? Monocytes Relative 4 %  ? Monocytes Absolute 0.7 0.1 - 1.0 K/uL  ? Eosinophils Relative 0 %  ? Eosinophils Absolute 0.0 0.0 - 0.5 K/uL  ? Basophils Relative 0 %  ? Basophils Absolute 0.1 0.0 - 0.1 K/uL  ? Immature Granulocytes 1 %  ? Abs Immature Granulocytes 0.08 (H) 0.00 - 0.07 K/uL  ?  Comment: Performed at [facility], [address] [place], [place] [address]  ?Procalcitonin - Baseline     Status: None  ? Collection Time: 10/11/21 10:19 AM  ?Result Value Ref Range  ? Procalcitonin  1.48 ng/mL  ?  Comment:        ?Interpretation: ?PCT > 0.5 ng/mL and <= 2 ng/mL: ?Systemic infection (sepsis) is possible, ?but other conditions are known to elevate ?PCT as well. ?(NOTE) ?      Sepsis PCT Algorithm           Lower Respiratory Tract ?                                     Infection PCT Algorithm ?   ----------------------------     ---------------------------- ?        PCT < 0.25 ng/mL                PCT < 0.10 ng/mL ? ?        Strongly encourage             Strongly discourage ?  discontinuation of antibiotics    initiation of antibiotics ?   ----------------------------     ----------------------------- ?      PCT 0.25 - 0.50 ng/mL            PCT 0.10 - 0.25 ng/mL ?              OR ?      >80% decrease in PCT            Discourage initiation of ?                                           antibiotics ?     Encourage discontinuation ?          of antibiotics ?   ----------------------------     ----------------------------- ?        PCT >= 0.50 ng/mL              PCT 0.26 - 0.50 ng/mL ?               AND ?      <80% decrease in PCT             Encourage initiation of ?  antibiotics ?      Encourage continuation ?          of antibiotics ?   ----------------------------     ----------------------------- ?       PCT >= 0.50 ng/mL                  PCT > 0.50 ng/mL ?              AND ?        increase in PCT                  Strongly encourage ?                                     initiation of antibiotics ?   Strongly encourage escalation ?          of antibiotics ?                                    ----------------------------- ?                                          PCT <= 0.25 ng/mL ?                                                OR ?                                       > 80% decrease in PCT ? ?                                    Discontinue / Do not initiate ?                                            antibiotics ? ?Performed at [facility],  [place], ?[place] [address] ?  ?Urinalysis, Complete w Microscopic     Status: Abnormal  ? Collection Time: 10/11/21  1:45 PM  ?Result Value Ref Range  ? Color, Urine YELLOW (A) YELLOW  ? APPearance CLEAR (A) CLEAR  ? Specific Gravity, Urine 1.013 1.005 - 1.030  ? pH 5.0 5.0 - 8.0  ? Glucose, UA NEGATIVE NEGATIVE mg/dL  ? Hgb urine dipstick NEGATIVE NEGATIVE  ? Bilirubin Urine NEGATIVE NEGATIVE  ? Ketones, ur NEGATIVE NEGATIVE mg/dL  ? Protein, ur NEGATIVE NEGATIVE mg/dL  ? Nitrite NEGATIVE NEGATIVE  ? Leukocytes,Ua SMALL (A) NEGATIVE  ? RBC / HPF 0-5 0 - 5 RBC/hpf  ? WBC, UA 11-20 0 - 5 WBC/hpf  ? Bacteria, UA NONE SEEN NONE SEEN  ? Squamous Epithelial / LPF NONE SEEN 0 - 5  ? Mucus PRESENT   ?  Comment: Performed at [facility], [address], [place], [place] [address]  ?  Lipid panel     Status: Abnormal  ? Collection Time: 10/12/21  3:28 AM  ?Result Value Ref Range  ? Cholesterol 132 0 - 200 mg/dL  ? Triglycerides 124 <150 mg/dL  ? HDL 17 (L) >40 mg/dL  ? Total CHOL/HDL Ratio 7.8 RATIO  ? VLDL 25 0 - 40 mg/dL  ? LDL Cholesterol 90 0 - 99 mg/dL  ?  Comment:        ?Total Cholesterol/HDL:CHD Risk ?Coronary Heart Disease Risk Table ?                    Men   Women ? 1/2 Average Risk   3.4   3.3 ? Average Risk       5.0   4.4 ? 2 X Average Risk   9.6   7.1 ? 3 X Average Risk  23.4   11.0 ?       ?Use the calculated Patient Ratio ?above and the CHD Risk Table ?to determine the patient's CHD Risk. ?       ?ATP III CLASSIFICATION (LDL): ? <100     mg/dL   Optimal ? 100-129  mg/dL   Near or Above ?                   Optimal ? 130-159  mg/dL   Borderline ? 160-189  mg/dL   High ? >190     mg/dL   Very High ?Performed at [facility], [address], [place], [place] [address] ?  ?Sedimentation rate     Status: Abnormal  ? Collection Time: 10/12/21  3:28 AM  ?Result Value Ref Range  ? Sed Rate 53 (H) 0 - 20 mm/hr  ?  Comment: Performed at [facility], [address]  [place], [place] [address]  ?CBC with Differential/Platelet     Status: Abnormal  ? Collection Time: 10/12/21 12:19 PM  ?Result Value Ref Range  ? WBC 17.7 (H) 4.0 - 10.5 K/uL  ? RBC 4.58 4.22 - 5.81 MIL/uL  ? H

## 2021-10-12 NOTE — ED Notes (Signed)
Accepted to [facility]  8A09  Dr. Winnifred Friar   [phone] for report ?

## 2021-10-12 NOTE — Consult Note (Signed)
Neurology Consultation ?Reason for Consult: Stroke on MRI ?Requesting Physician: Jennye Boroughs ? ?CC: Vision loss in the right eye ? ?History is obtained from: Patient, wife at bedside, chart review and other providers ? ?HPI: [name] is a 66 y.o. male with a past medical history significant for hypertension, daily cocaine use, 1 pack/day smoking. ? ?He reports that he has had 6 months of intermittent vision trouble in the right eye (blurry vision which would resolve).  2 to 3 weeks ago he had an upper respiratory infection with URI and cough, which has resolved although he has a chronic smoker's cough.  Approximately 3 days ago he began to have pain with turning his head to the right in particular but also with any movement of his neck especially sudden movements such as when coughing.  While in the ED this pain has improved, however, he has developed some chest pain that he finds difficult to describe but reports it is not worsened with deep breaths nor positional.  He denies having any fevers or sweats but notes he has been easily cold in the past 2 to 3 days.  He has additionally had hearing loss in his right ear.  Dr. Delaine Lame also notes 30 lb weight loss in 2 years based on chart review which patient attributes to appetite suppression secondary to frequent cocaine use. He associates a headache with the neck pain, but this is not positional and improves with being still. 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 He denies any skin lesions, wife and patient deny and personality changes, episodes of confusion etc.  ? ?LKW: 6 months ago ?tPA given?: No, out of the window ?Premorbid modified rankin scale: 0-1 ? ?ROS: All other review of systems was negative except as noted in the HPI.  ? ?Past Medical History:  ?Diagnosis Date  ? Hypertension   ? ?History reviewed. No pertinent surgical history. ? ? ?No family history on file. ?He denies any family history of autoimmune diseases such as lupus  or rheumatoid arthritis ? ?Social History:  reports that he has been smoking cigarettes. He has a 46.00 pack-year smoking history. He does not have any smokeless tobacco history on file. He reports current alcohol use. No history on file for drug use.  He confirms daily cocaine use ? ?Exam: ?Current vital signs: ?BP 122/68 (BP Location: Left Arm)   Pulse 79   Temp 97.8 ?F (36.6 ?C) (Oral)   Resp 20   Ht 6' (1.829 m)   Wt 71.7 kg   SpO2 93%   BMI 21.43 kg/m?  ?Vital signs in last 24 hours: ?Temp:  [97.8 ?F (36.6 ?C)] 97.8 ?F (36.6 ?C) (04/04 1345) ?Pulse Rate:  [65-81] 79 (04/04 1345) ?Resp:  [16-24] 20 (04/04 1345) ?BP: (122-139)/(57-68) 122/68 (04/04 1345) ?SpO2:  [92 %-97 %] 93 % (04/04 1345) ? ? ?Physical Exam  ?Constitutional: Appears cachetic, no acute distress ?Psych: Affect appropriate to situation, calm and cooperative, flat affect ?Eyes: Right eye hypopyon and some scleral injection. Left eye within normal limits  ?HENT: No oropharyngeal obstruction. Right ear canal appears to be occluded by wax, no other lesions seen ?MSK: Bilaterally enlarged knuckles. Tenderness to palpation of the cervical spine most marked at C4/C5, No other spinal tenderness ?Cardiovascular: Perfusing extremities well ?Respiratory: Effort normal, non-labored breathing ?GI: Soft.  No distension. There is no tenderness.  ?Skin: Warm dry and intact visible skin ? ?Neuro: ?Mental Status: ?Patient is awake, alert, oriented to person, place, month, year, and situation. ?Patient  is able to give some history though a bit unclear on timelines  ?No signs of aphasia or neglect ?Cranial Nerves: ?II: Visual Fields are on the left. Right eye has right sided vision loss, able to count fingers in the left visual fields. Pupils are post ophthalmology eval, left eye 5 mm, right 3 mm, fixed   ?III,IV, VI: EOMI  ?V: Facial sensation is symmetric to temperature ?VII: Facial movement is symmetric.  ?VIII: hearing is intact to voice ?X: Uvula  elevates symmetrically ?XI: Shoulder shrug is symmetric. ?XII: tongue is midline without atrophy or fasciculations.  ?Motor: ?Tone is normal. Bulk is normal. 5/5 strength other than hip flexors 4/5 ?Sensory: ?Length dependent loss of temperature in the arms and diminished vibration sense in the hands  ?Deep Tendon Reflexes: ?2+ and symmetric in the brachioradialis, biceps and patellae.  ?Cerebellar: ?FNF and HKS are intact bilaterally ?Gait:  ?Deferred  ? ?NIHSS total 0 ? ?Performed at 4:45 PM  ? ? ?I have reviewed labs in epic and the results pertinent to this consultation are: ? ?Basic Metabolic Panel: ?Recent Labs  ?Lab 10/11/21 ?1019 10/12/21 ?1219  ?NA 131* 131*  ?K 3.8 4.3  ?CL 94* 95*  ?CO2 28 28  ?GLUCOSE 111* 116*  ?BUN 24* 22  ?CREATININE 1.20 1.18  ?CALCIUM 8.3* 8.0*  ? ?Lab Results  ?Component Value Date  ? ALT 177 (H) 10/12/2021  ? AST 94 (H) 10/12/2021  ? ALKPHOS 235 (H) 10/12/2021  ? BILITOT 0.7 10/12/2021  ? ? ? ?CBC: ?Recent Labs  ?Lab 10/11/21 ?1019 10/12/21 ?1219  ?WBC 14.8* 17.7*  ?NEUTROABS 13.4* 14.8*  ?HGB 12.4* 13.4  ?HCT 37.3* 39.8  ?MCV 86.7 86.9  ?PLT 267 315  ?CBC 09/29/2021 with normal white count, hemoglobin 13.6 ? ?Coagulation Studies: ?No results for input(s): LABPROT, INR in the last 72 hours.  ? ? ?I have reviewed the images obtained: ? ?MRI brain personally reviewed, agree with radiology ?1. Punctate focus of restricted diffusion in the right centrum ?semiovale consistent with acute/subacute lacunar infarct. ?2. 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 ?  ?MRI C-spine personally reviewed, agree with radiology ?"Bone marrow edema at the endplates of T1 and T2. Some endplate ?deformity. Findings could simply relate to degenerative disc disease ?with a superior endplate Schmorl's node at T2. This could relate to ?regional pain. It should be noted that in the early stages, discitis ?osteomyelitis could have this  appearance. Does the patient have ?clinical evidence of infection? ?  ?Facet arthritis on the right at C2-3 and C3-4 with small joint ?effusions. Edematous change of the adjacent soft tissues. Findings ?could be due to inflammatory facet arthropathy and could be a cause ?of pain. This could be non infectious or infectious. ?  ?More chronic appearing facet arthritis on the right at C4-5 and on ?the left at C5-6, without edema or effusions. ?  ?No compressive canal stenosis. Ordinary degenerative spondylosis at ?each level as outlined above." ? ? ?CTA personally reviewed, agree with radiology ?IMPRESSION: ?1. No evidence of acute intracranial pathology. ?2. Mild calcified atherosclerotic plaque of the bilateral carotid ?bifurcations without hemodynamically significant stenosis. Patent ?vertebral arteries. ?3. Atherosclerotic plaque in the bilateral intracranial ICAs ?resulting in mild-to-moderate stenosis of the right supraclinoid ?segment and no greater than mild stenosis on the left. Otherwise, ?patent intracranial vasculature. ?4. Right mastoid and middle ear effusions. The right nasopharynx is ?grossly unremarkable. ?5. Aortic Atherosclerosis (ICD10-I70.0) and Emphysema (ICD10-J43.9). ? ?TTE:  ?  1. Left ventricular ejection fraction, by estimation, is 65 to 70%. The  ?left ventricle has normal function. The left ventricle has no regional  ?wall motion abnormalities. There is mild left ventricular hypertrophy.  ?Left ventricular diastolic parameters  ?were normal.  ? 2. Right ventricular systolic function is normal. The right ventricular  ?size is normal.  ? 3. The mitral valve is normal in structure. Mild mitral valve  ?regurgitation.  ? 4. The aortic valve is calcified. Aortic valve regurgitation is mild.  ?Moderate aortic valve stenosis.  ? ? ?Impression: The constellation of findings with inflammatory changes of the eye, a very minor punctate possible stroke versus inflammatory lesion on MRI brain, potential  inflammatory C-and T-spine lesions (vs. Ordinary arthritis) is concerning for an systemic autoimmune or infectious process. In addition to infectious disease testing as outlined below, possibilities are broad and include but ar

## 2021-10-12 NOTE — ED Notes (Signed)
Sent facesheet and powershared images to [facility] 1536 ?

## 2021-10-12 NOTE — Progress Notes (Addendum)
? ? ? ?Progress Note  ? ? ?[name]  LHT:[id number] DOB: 11-20-1955  DOA: 10/11/2021 ?PCP: [facility]  ? ? ? ? ?Brief Narrative:  ? ? ?Medical records reviewed and are as summarized below: ? ?[name] is a 66 y.o. male with medical history significant for hypertension, aortic stenosis, hyperlipidemia, GERD, who presented to the hospital with right-sided neck pain, headache and blurry vision in the right eye.  MRI brain showed changes consistent with acute/subacute lacunar infarct.  MRI of the cervical spine showed bone marrow edema at the endplates of T1 and T2 with findings that could be related to degenerative disc disease, possibly from early stages of discitis/osteomyelitis.  He was initially started on IV Unasyn for right mastoid and middle ear effusion. ? ? ? ? ? ?Assessment/Plan:  ? ?Principal Problem: ?  CVA (cerebral vascular accident) ([place]) ?Active Problems: ?  Vision loss of right eye ? ? ? ?Body mass index is 21.43 kg/m?. ? ? ?Acute/subacute stroke lacunar infarct: Continue aspirin.  Start Lipitor.  Neurologist has been consulted. ? 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 Benay Pillow, ophthalmologist.  He recommended transfer to a tertiary care center for further evaluation preferably by a retinal specialist.  I have reached out to [facility], [facility] and [facility] and [facility] said they do not have any beds on the medicine service.  [facility] transfer coordinator said the ophthalmologist requested to speak to our ophthalmologist.  I gave Dr. Melony Overly number to her.  Awaiting callback. ? ?Bone marrow edema of endplates of T1 and T2,  possible degenerative disc disease with a superior endplate Schmorl's node at T2, possible early stages of discitis/osteomyelitis: Consulted Dr. Lacinda Axon, neurosurgeon for further evaluation.  Consulted Dr. Delaine Lame, infectious disease specialist, to assist with  management.  She recommended stopping antibiotics in case he needs a bone biopsy ? ?Right mastoid and middle ear effusions: Outpatient follow-up with ENT recommended. ? ? ? ?Diet Order   ? ?       ?  Diet Heart Room service appropriate? Yes; Fluid consistency: Thin  Diet effective now       ?  ? ?  ?  ? ?  ? ? ? ? ? ? ?Consultants: ?Neurologist ?Neurosurgeon ?Infectious disease ? ?Procedures: ?None ? ? ? ?Medications:  ? ?  stroke: mapping our early stages of recovery book   Does not apply Once  ? amLODipine  10 mg Oral Daily  ? aspirin EC  81 mg Oral Daily  ? etomidate      ? rocuronium bromide      ? ?Continuous Infusions: ? ? ?Anti-infectives (From admission, onward)  ? ? Start     Dose/Rate Route Frequency Ordered Stop  ? 10/11/21 1600  Ampicillin-Sulbactam (UNASYN) 3 g in sodium chloride 0.9 % 100 mL IVPB  Status:  Discontinued       ? 3 g ?200 mL/hr over 30 Minutes Intravenous Every 6 hours 10/11/21 1601 10/12/21 1026  ? ?  ? ? ? ? ? ? ? ? ? ?Family Communication/Anticipated D/C date and plan/Code Status  ? ?DVT prophylaxis: SCD's Start: 10/11/21 1344 ? ? ?  Code Status: Full Code ? ?Family Communication: Plan discussed with his wife over the phone.  His sister was also at the bedside. ?Disposition Plan: Plan to transfer to tertiary care center. ? ? ?Status is: Observation ?The patient will require care  spanning > 2 midnights and should be moved to inpatient because: Acute stroke, visual loss of the right eye ? ? ? ? ? ? ?Subjective:  ? ?Interval events noted.  He complains of blurry vision in right eye right-sided neck pain.  No unilateral weakness or numbness in the extremities. ? ? ?Objective:  ? ? ?Vitals:  ? 10/12/21 0610 10/12/21 0800 10/12/21 0900 10/12/21 1345  ?BP: 138/65 133/60 (!) 128/57 122/68  ?Pulse: 75 81 74 79  ?Resp: 18 (!) '24 18 20  '$ ?Temp:    97.8 ?F (36.6 ?C)  ?TempSrc:    Oral  ?SpO2: 92% 92% 95% 93%  ?Weight:      ?Height:      ? ?No data found. ? ? ?Intake/Output Summary (Last 24 hours)  at 10/12/2021 1507 ?Last data filed at 10/11/2021 2249 ?Gross per 24 hour  ?Intake 100 ml  ?Output --  ?Net 100 ml  ? ?Filed Weights  ? 10/11/21 0909  ?Weight: 71.7 kg  ? ? ?Exam: ? ?GEN: NAD ?SKIN: No rash ?EYES: EOMI, impaired vision in right eye ?ENT: MMM, mild tenderness on the right side of the posterior neck ?CV: RRR ?PULM: CTA B ?ABD: soft, ND, NT, +BS ?CNS: AAO x 3, non focal ?EXT: No edema or tenderness ? ? ? ?  ? ? ?Data Reviewed:  ? ?I have personally reviewed following labs and imaging studies: ? ?Labs: ?Labs show the following:  ? ?Basic Metabolic Panel: ?Recent Labs  ?Lab 10/11/21 ?1019 10/12/21 ?1219  ?NA 131* 131*  ?K 3.8 4.3  ?CL 94* 95*  ?CO2 28 28  ?GLUCOSE 111* 116*  ?BUN 24* 22  ?CREATININE 1.20 1.18  ?CALCIUM 8.3* 8.0*  ? ?GFR ?Estimated Creatinine Clearance: 64.1 mL/min (by C-G formula based on SCr of 1.18 mg/dL). ?Liver Function Tests: ?No results for input(s): AST, ALT, ALKPHOS, BILITOT, PROT, ALBUMIN in the last 168 hours. ?No results for input(s): LIPASE, AMYLASE in the last 168 hours. ?No results for input(s): AMMONIA in the last 168 hours. ?Coagulation profile ?No results for input(s): INR, PROTIME in the last 168 hours. ? ?CBC: ?Recent Labs  ?Lab 10/11/21 ?1019 10/12/21 ?1219  ?WBC 14.8* 17.7*  ?NEUTROABS 13.4* 14.8*  ?HGB 12.4* 13.4  ?HCT 37.3* 39.8  ?MCV 86.7 86.9  ?PLT 267 315  ? ?Cardiac Enzymes: ?No results for input(s): CKTOTAL, CKMB, CKMBINDEX, TROPONINI in the last 168 hours. ?BNP (last 3 results) ?No results for input(s): PROBNP in the last 8760 hours. ?CBG: ?No results for input(s): GLUCAP in the last 168 hours. ?D-Dimer: ?No results for input(s): DDIMER in the last 72 hours. ?Hgb A1c: ?No results for input(s): HGBA1C in the last 72 hours. ?Lipid Profile: ?Recent Labs  ?  10/12/21 ?0328  ?CHOL 132  ?HDL 17*  ?Hadar 90  ?TRIG 124  ?CHOLHDL 7.8  ? ?Thyroid function studies: ?No results for input(s): TSH, T4TOTAL, T3FREE, THYROIDAB in the last 72 hours. ? ?Invalid input(s):  FREET3 ?Anemia work up: ?No results for input(s): VITAMINB12, FOLATE, FERRITIN, TIBC, IRON, RETICCTPCT in the last 72 hours. ?Sepsis Labs: ?Recent Labs  ?Lab 10/11/21 ?1019 10/12/21 ?1219  ?PROCALCITON 1.48 1.22  ?WBC 14.8* 17.7*  ? ? ?Microbiology ?No results found for this or any previous visit (from the past 240 hour(s)). ? ?Procedures and diagnostic studies: ? ?CT ANGIO HEAD NECK W WO CM ? ?Result Date: 10/11/2021 ?CLINICAL DATA:  Right-sided neck pain for a few days, headache blurry vision and pain in the right eye EXAM: CT ANGIOGRAPHY  HEAD AND NECK TECHNIQUE: Multidetector CT imaging of the head and neck was performed using the standard protocol during bolus administration of intravenous contrast. Multiplanar CT image reconstructions and MIPs were obtained to evaluate the vascular anatomy. Carotid stenosis measurements (when applicable) are obtained utilizing NASCET criteria, using the distal internal carotid diameter as the denominator. RADIATION DOSE REDUCTION: This exam was performed according to the departmental dose-optimization program which includes automated exposure control, adjustment of the mA and/or kV according to patient size and/or use of iterative reconstruction technique. CONTRAST:  76m OMNIPAQUE IOHEXOL 350 MG/ML SOLN COMPARISON:  Carotid Doppler 02/24/2011 FINDINGS: CT HEAD FINDINGS Brain: There is no evidence of acute intracranial hemorrhage, extra-axial fluid collection, or acute infarct. Parenchymal volume is normal. The ventricles are normal in size. Gray-white differentiation is preserved. There is no mass lesion. There is no mass effect or midline shift. Vascular: See below. Skull: Normal. Negative for fracture or focal lesion. Sinuses: There is mild mucosal thickening in the paranasal sinuses. Orbits: The globes and orbits are unremarkable. There are no inflammatory changes identified in the right orbit. Other: There are right mastoid and middle ear effusions. The right nasopharynx is  grossly unremarkable. Review of the MIP images confirms the above findings CTA NECK FINDINGS Aortic arch: There is minimal calcified atherosclerotic plaque of the aortic arch. The origins of the major branch vessels ar

## 2021-10-12 NOTE — Evaluation (Signed)
Occupational Therapy Evaluation ?Patient Details ?Name: [name] ?MRN: [id number] ?DOB: [birth year] ?Today's Date: 10/12/2021 ? ? ?History of Present Illness 66 y.o. male with medical history significant for HTN, aortic valve stensis, HLD,GERD who presents to ED with complaint of right sided neck pain associated with HA as well as right sided vision changes x 2 days. 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  ? ?Clinical Impression ?  ?Pt seen for OT evaluation this date. Prior to hospital admission, pt was independent in all aspects of ADL/IADL, using no AD for functional mobility, and denies falls history in past 12 months. Pt lives with his spouse in a 1 story mobile home with 4-5 steps to enter with R hand rail.  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 Pt currently presents with R neck pain (pain worsening when upright and walking), restricted ocular ROM in R eye, and decreased peripheral vision to R side. Due to these functional impairments, pt requires increased time/effort (no additional physical assist) to perform ADLs and mobility tasks. Pt educated on importance of refraining from driving and cooking with current visual impairments; pt verbalized understanding. No additional strength, sensory, coordination, or cognitive deficits appreciated with assessment. Upon discharge, recommend outpatient OT to address visual deficits and maximize return to PLOF.  ? ?Recommendations for follow up therapy are one component of a multi-disciplinary discharge planning process, led by the attending physician.  Recommendations may be updated based on patient status, additional functional criteria and insurance authorization.  ? ?Follow Up Recommendations ? Outpatient OT  ?  ?Assistance Recommended at Discharge  PRN  ?Patient can return home with the following Assistance with cooking/housework;Assist for transportation ? ?  ?Functional Status Assessment ? Patient has had a recent decline in their functional status and demonstrates the ability to make significant improvements in function in a reasonable and predictable amount of time.  ?Equipment Recommendations ? None recommended by OT  ?  ?   ?Precautions / Restrictions Precautions ?Precautions: None ?Restrictions ?Weight Bearing Restrictions: No  ? ?  ? ?Mobility Bed Mobility ?Overal bed mobility: Modified Independent ?  ?  ?  ?  ?  ?  ?  ?  ? ?Transfers ?Overall transfer level: Modified independent ?Equipment used: None ?  ?  ?  ?  ?  ?  ?  ?  ?  ? ?  ?Balance Overall balance assessment: Independent ?  ?  ?  ?  ?  ?  ?  ?  ?  ?  ?  ?  ?  ?  ?  ?  ?  ?  ?   ? ?ADL either performed or assessed with clinical judgement  ? ?ADL Overall ADL's : Modified independent ?  ?  ?  ?  ?  ?  ?  ?  ?  ?  ?  ?  ?  ?  ?  ?  ?  ?  ?  ?General ADL Comments: Requires increase time/effort to perform seated LB dressing, functional mobility, and standing grooming tasks  ? ? ? ?Vision Ability to See in Adequate Light: 2 Moderately impaired ?Patient Visual Report: Blurring of vision ?Vision Assessment?: Yes ?Eye Alignment: Within Functional Limits ?Ocular Range of Motion: Restricted on the right  ?   ?   ?   ? ?Pertinent Vitals/Pain Pain  Assessment ?Pain Assessment: 0-10 ?Pain Score: 2  ?Pain Location: R neck ?Pain Descriptors / Indicators: Aching ?Pain Intervention(s): Limited activity within patient's tolerance, Monitored during session, Premedicated before session  ? ? ? ?Hand Dominance Right ?  ?Extremity/Trunk Assessment Upper Extremity Assessment ?Upper Extremity Assessment: Overall WFL for tasks assessed ?  ?Lower Extremity Assessment ?Lower Extremity Assessment: Overall WFL for tasks assessed ?  ?  ?  ?Communication Communication ?Communication: No difficulties ?  ?Cognition  Arousal/Alertness: Awake/alert ?Behavior During Therapy: [facility] for tasks assessed/performed ?Overall Cognitive Status: Within Functional Limits for tasks assessed ?  ?  ?  ?  ?  ?  ?  ?  ?  ?  ?  ?  ?  ?  ?  ?  ?General Comments: A&Ox4 ?  ?  ?   ?   ?   ? ? ?Home Living Family/patient expects to be discharged to:: Private residence ?Living Arrangements: Spouse/significant other ?Available Help at Discharge: Family;Available 24 hours/day ?Type of Home: Mobile home ?Home Access: Stairs to enter ?Entrance Stairs-Number of Steps: 4-5 ?Entrance Stairs-Rails: Right ?Home Layout: One level ?  ?  ?Bathroom Shower/Tub: Tub/shower unit ?  ?Bathroom Toilet: Handicapped height ?  ?  ?Home Equipment: None ?  ?  ?  ? ?  ?Prior Functioning/Environment Prior Level of Function : Independent/Modified Independent ?  ?  ?  ?  ?  ?  ?Mobility Comments: Independent with functional mobility. Denies any falls ?ADLs Comments: Independent with ADLs. Drives and works ?  ? ?  ?  ?OT Problem List: Impaired vision/perception;Pain ?  ?   ?OT Treatment/Interventions: Self-care/ADL training;Therapeutic exercise;Neuromuscular education;DME and/or AE instruction;Therapeutic activities;Visual/perceptual remediation/compensation;Patient/family education  ?  ?OT Goals(Current goals can be found in the care plan section) Acute Rehab OT Goals ?Patient Stated Goal: to have less pain ?OT Goal Formulation: With patient ?Time For Goal Achievement: 10/26/21 ?Potential to Achieve Goals: Good  ?OT Frequency: Min 2X/week ?  ? ?   ?AM-PAC OT "6 Clicks" Daily Activity     ?Outcome Measure Help from another person eating meals?: None ?Help from another person taking care of personal grooming?: None ?Help from another person toileting, which includes using toliet, bedpan, or urinal?: None ?Help from another person bathing (including washing, rinsing, drying)?: A Little ?Help from another person to put on and taking off regular upper body clothing?: None ?Help from  another person to put on and taking off regular lower body clothing?: None ?6 Click Score: 23 ?  ?End of Session Nurse Communication: Mobility status;Patient requests pain meds ? ?Activity Tolerance: Patient tolerated treatment well ?Patient left: in bed;with call bell/phone within reach;with family/visitor present ? ?OT Visit Diagnosis: Other symptoms and signs involving the nervous system (R29.898);Pain ?Pain - Right/Left: Right ?Pain - part of body:  (neck)  ?              ?Time: [id number] ?OT Time Calculation (min): 19 min ?Charges:  OT General Charges ?$OT Visit: 1 Visit ?OT Evaluation ?$OT Eval Moderate Complexity: 1 Mod ? ?Fredirick Maudlin, OTR/L ?[place] ? ?

## 2021-10-12 NOTE — Evaluation (Signed)
Physical Therapy Evaluation ?Patient Details ?Name: [name] ?MRN: [id number] ?DOB: August 01, 1955 ?Today's Date: 10/12/2021 ? ?History of Present Illness ? 66 y.o. male with medical history significant for HTN, aortic valve stensis, HLD,GERD who presents to ED with complaint of right sided neck pain associated with HA as well as right sided vision changes x 2 days. 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 ? ?  ?Clinical Impression ? Pt asleep in supine position upon arrival to room with wife in room as well.  Pt easily awakened and reporting some chest pain, 2/10, for which the nurse had already been notified of and aware of pain.  Pt able to demonstrate independence with all bed mobility and ambulation.  Pt returned to bed after ambulating 160 feet, and noting that he feels closer to baseline levels at this point.  Patient is at baseline, all education completed, and time is given to address all questions/concerns. No additional skilled PT services needed at this time, PT signing off. PT recommends daily ambulation ad lib or with nursing staff as needed to prevent deconditioning.  ?   ?  ?  ? ?Recommendations for follow up therapy are one component of a multi-disciplinary discharge planning process, led by the attending physician.  Recommendations may be updated based on patient status, additional functional criteria and insurance authorization. ? ?Follow Up Recommendations No PT follow up ? ?  ?Assistance Recommended at Discharge None  ?Patient can return home with the following ?   ? ?  ?Equipment Recommendations None recommended by PT  ?Recommendations for Other Services ?    ?  ?Functional Status Assessment Patient has not had a recent decline in their functional status  ? ?  ?Precautions / Restrictions  Precautions ?Precautions: None ?Restrictions ?Weight Bearing Restrictions: No  ? ?  ? ?Mobility ? Bed Mobility ?Overal bed mobility: Modified Independent ?  ?  ?  ?  ?  ?  ?  ?  ? ?Transfers ?Overall transfer level: Modified independent ?Equipment used: None ?  ?  ?  ?  ?  ?  ?  ?  ?  ? ?Ambulation/Gait ?Ambulation/Gait assistance: Modified independent (Device/Increase time) ?Gait Distance (Feet): 160 Feet ?Assistive device: None ?Gait Pattern/deviations: WFL(Within Functional Limits) ?Gait velocity: good speed. ?  ?  ?General Gait Details: good gait mechanics ? ?Stairs ?  ?  ?  ?  ?  ? ?Wheelchair Mobility ?  ? ?Modified Rankin (Stroke Patients Only) ?  ? ?  ? ?Balance Overall balance assessment: Independent ?  ?  ?  ?  ?  ?  ?  ?  ?  ?  ?  ?  ?  ?  ?  ?  ?  ?  ?   ? ? ? ?Pertinent Vitals/Pain Pain Assessment ?Pain Assessment: 0-10 ?Pain Score: 2  ?Pain Location: Chest pain, nursing already notified and aware ?Pain Descriptors / Indicators: Aching ?Pain Intervention(s): Limited activity within patient's tolerance, Monitored during session, Premedicated before session, Repositioned  ? ? ?Home Living Family/patient expects to be discharged to:: Private residence ?Living Arrangements: Spouse/significant other ?Available Help at Discharge: Family;Available 24 hours/day ?Type of Home: Mobile home ?Home Access: Stairs to enter ?Entrance Stairs-Rails: Right ?Entrance Stairs-Number of Steps: 4-5 ?  ?Home Layout: One level ?Home Equipment: None ?   ?  ?Prior Function Prior Level of  Function : Independent/Modified Independent ?  ?  ?  ?  ?  ?  ?Mobility Comments: Independent with functional mobility. Denies any falls ?ADLs Comments: Independent with ADLs. Drives and works ?  ? ? ?Hand Dominance  ? Dominant Hand: Right ? ?  ?Extremity/Trunk Assessment  ? Upper Extremity Assessment ?Upper Extremity Assessment: Overall WFL for tasks assessed ?  ? ?Lower Extremity Assessment ?Lower Extremity Assessment: Overall WFL for tasks  assessed ?  ? ?   ?Communication  ? Communication: No difficulties  ?Cognition Arousal/Alertness: Awake/alert ?Behavior During Therapy: [facility] for tasks assessed/performed ?Overall Cognitive Status: Within Functional Limits for tasks assessed ?  ?  ?  ?  ?  ?  ?  ?  ?  ?  ?  ?  ?  ?  ?  ?  ?General Comments: A&Ox4 ?  ?  ? ?  ?General Comments   ? ?  ?Exercises Other Exercises ?Other Exercises: Pt and wife educated on roels of PT and services provided during hospital stay.  ? ?Assessment/Plan  ?  ?PT Assessment Patient does not need any further PT services  ?PT Problem List   ? ?   ?  ?PT Treatment Interventions     ? ?PT Goals (Current goals can be found in the Care Plan section)  ?Acute Rehab PT Goals ?Patient Stated Goal: to go home. ?PT Goal Formulation: With patient/family ?Time For Goal Achievement: 10/26/21 ?Potential to Achieve Goals: Good ? ?  ?Frequency   ?  ? ? ?Co-evaluation   ?  ?  ?  ?  ? ? ?  ?AM-PAC PT "6 Clicks" Mobility  ?Outcome Measure Help needed turning from your back to your side while in a flat bed without using bedrails?: None ?Help needed moving from lying on your back to sitting on the side of a flat bed without using bedrails?: None ?Help needed moving to and from a bed to a chair (including a wheelchair)?: None ?Help needed standing up from a chair using your arms (e.g., wheelchair or bedside chair)?: None ?Help needed to walk in hospital room?: None ?Help needed climbing 3-5 steps with a railing? : None ?6 Click Score: 24 ? ?  ?End of Session Equipment Utilized During Treatment: Gait belt ?Activity Tolerance: Patient tolerated treatment well ?Patient left: in bed;with call bell/phone within reach;with family/visitor present;Other (comment) (seated EOB.) ?Nurse Communication: Mobility status ?PT Visit Diagnosis: Muscle weakness (generalized) (M62.81) ?  ? ?Time: 6979-4801 ?PT Time Calculation (min) (ACUTE ONLY): 21 min ? ? ?Charges:   PT Evaluation ?$PT Eval Low Complexity: 1 Low ?PT  Treatments ?$Therapeutic Activity: 8-22 mins ?  ?   ? ? ?Gwenlyn Saran, PT, DPT ?10/12/21, 2:17 PM ? ? ?Christie Nottingham ?10/12/2021, 2:11 PM ? ?

## 2021-10-12 NOTE — Consult Note (Signed)
NAME: [name]  ?DOB: 1955-07-30  ?MRN: [id number]  ?Date/Time: 10/12/2021 6:34 PM ? ?REQUESTING PROVIDER: Dr.Ayiku ?Subjective:  ?REASON FOR CONSULT: thoracic  discitis ?? ?[name] is a 66 y.o. 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 HE took pain meds and lay in bed for the next few days thinking it wound get better- HE also had pain top of his head on the rt side. He also noted that the rt eye he could not see well especially on the lateral aspect- He did not have any fever, no trauma, cough of 3 weeks with whitish sputum , improving now. As he was not getting better he came to the ED yesterday ?30 pound weight loss in 3 years- HE smokes cocaine and thinks it could be because of that ?In the ED vitals BP 134/63, Temp 98.6, HR 69,  ?Wbc 14.8, hb 12.4, plt 267. ?MRI brain  showed Punctate focus of restricted diffusion in the right centrum semiovale consistent with acute/subacute lacunar infarct. ?2. 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 ?MRI neck showed Discogenic endplate edematous changes at T1-2. These are presumed to relate to degenerative disc disease and endplate Schmorl's nodes. 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 [facility] as this was a complex case ? ? ? ?Past Medical History:  ?Diagnosis Date  ? Hypertension   ? Aortic valve stenosis by  echo ? ?SH ?Toe surgery ? ?Social History  ? ?Socioeconomic History  ? Marital status: Married  ?  Spouse name: Not on file  ? Number of children: Not on file  ? Years of education: Not on file  ? Highest education level: Not on file  ?Occupational History  ? Not on file  ?Tobacco Use  ? Smoking status: Every Day  ?  Packs/day: 1.00  ?  Years: 46.00  ?  Pack years: 46.00  ?  Types: Cigarettes  ? Smokeless tobacco: Not on file  ?Substance and Sexual Activity  ? Alcohol use: Yes  ? Drug use: Not on file  ? Sexual activity: Not on file  ?Other Topics Concern  ? Not on file  ?Social History Narrative  ? Not on file  ? ?Social Determinants of Health  ? ?Financial Resource Strain: Not on file  ?Food Insecurity: Not on file  ?Transportation Needs: Not on file  ?Physical Activity: Not on file  ?Stress: Not on file  ?Social Connections: Not on file  ?Intimate Partner Violence: Not on file  ?  ?FH ?Colon cancer sister ?DM mother ?HTN -sister ?HTN- father ?HTN brother ?No Known Allergies ?I? ?Current Facility-Administered Medications  ?Medication Dose Route Frequency Provider Last Rate Last Admin  ?  stroke: mapping our early stages of recovery book   Does not apply Once Clance Boll, MD      ?  acetaminophen (TYLENOL) tablet 650 mg  650 mg Oral Q4H PRN Clance Boll, MD   650 mg at 10/12/21 1209  ? Or  ? acetaminophen (TYLENOL) 160 MG/5ML solution 650 mg  650 mg Per Tube Q4H PRN Clance Boll, MD      ? Or  ? acetaminophen (TYLENOL) suppository 650 mg  650 mg Rectal Q4H PRN Clance Boll, MD      ? amLODipine (NORVASC) tablet 10 mg  10 mg Oral Daily Clance Boll, MD   10 mg at 10/12/21 0951  ? aspirin EC tablet 81 mg  81 mg Oral Daily Myles Rosenthal A, MD   81 mg at 10/12/21 3664  ? atorvastatin (LIPITOR) tablet 80 mg  80 mg Oral Daily Jennye Boroughs, MD   80 mg at 10/12/21 1621  ? etomidate (AMIDATE) 2 MG/ML injection           ? ketorolac (TORADOL) 15 MG/ML injection 15 mg  15 mg  Intravenous Q6H PRN Clance Boll, MD   15 mg at 10/12/21 1209  ? oxyCODONE (Oxy IR/ROXICODONE) immediate release tablet 5 mg  5 mg Oral Q6H PRN Jennye Boroughs, MD   5 mg at 10/12/21 1209  ? prednisoLONE acetate (PRED FORTE) 1 % ophthalmic suspension 1 drop  1 drop Right Eye 6 X Daily Eulogio Bear, MD   1 drop at 10/12/21 1621  ? rocuronium bromide 100 MG/10ML SOSY           ? ?Current Outpatient Medications  ?Medication Sig Dispense Refill  ? amLODipine (NORVASC) 10 MG tablet Take 10 mg by mouth daily.    ? Multiple Vitamins-Minerals (MULTIVITAMIN WITH MINERALS) tablet Take 1 tablet by mouth daily.    ? aspirin EC 81 MG tablet Take 81 mg by mouth daily. Swallow whole. (Patient not taking: Reported on 10/11/2021)    ? esomeprazole (NEXIUM) 40 MG capsule Take 40 mg by mouth daily at 12 noon. (Patient not taking: Reported on 10/11/2021)    ?  ? ?Abtx:  ?Anti-infectives (From admission, onward)  ? ? Start     Dose/Rate Route Frequency Ordered Stop  ? 10/12/21 1800  Ampicillin-Sulbactam (UNASYN) 3 g in sodium chloride 0.9 % 100 mL IVPB  Status:  Discontinued       ? 3 g ?200 mL/hr over 30 Minutes Intravenous Every 6 hours 10/12/21 1625 10/12/21 1657  ? 10/11/21 1600  Ampicillin-Sulbactam (UNASYN) 3 g in sodium chloride 0.9 % 100 mL IVPB  Status:  Discontinued       ? 3 g ?200 mL/hr over 30 Minutes Intravenous Every 6 hours 10/11/21 1601 10/12/21 1026  ? ?  ? ? ?REVIEW OF SYSTEMS:  ?Const: negative fever, negative chills, weight loss 30 pounds in 2 years ?Eyes: rt eye blurred vision ?No redness or acute pain ? ?ENT: negative coryza, negative sore throat ?Resp:  cough, whitish sputum ?Cards: neg chest pain ( until he came to the hospital) , no palpitations, lower extremity edema ?GU: negative for frequency, dysuria and hematuria ?GI: Negative for abdominal pain, diarrhea, bleeding, constipation ?Skin: negative for rash and pruritus ?Heme: negative for easy bruising and gum/nose bleeding ?MS: negative for myalgias,  arthralgias, back pain and muscle weakness ?Neurolo: headaches, no dizziness, vertigo, memory problems  ?Psych: negative for feelings of anxiety, depression  ?Endocrine: negative for thyroid, diabetes ?Allergy/Immunology- negative for any medication or food allergies ?? ?Objective:  ?VITALS:  ?BP 122/68 (BP Location: Left Arm)   Pulse 79  Temp 97.8 ?F (36.6 ?C) (Oral)   Resp 20   Ht 6' (1.829 m)   Wt 71.7 kg   SpO2 93%   BMI 21.43 kg/m?  ?PHYSICAL EXAM:  ?General: Alert, cooperative, no distress, emaciated, unkempt  ?Head: Normocephalic, without obvious abnormality, atraumatic. ?Eyes: rt eye hypopyon ?Mild congestion ?Full range of movts ?Finger counting ?ENT Nares normal. No drainage or sinus tenderness. ?Lips, mucosa, and tongue normal. No Thrush ?Neck: Supple, symmetrical, no adenopathy, thyroid: non tender ?no carotid bruit and no JVD. ?Back: No CVA tenderness. ?Lungs: Clear to auscultation bilaterally. No Wheezing or Rhonchi. No rales. ?Heart: Regular rate and rhythm, no murmur, rub or gallop. ?Abdomen: Soft, non-tender,not distended. Bowel sounds normal. No masses ?Extremities: atraumatic, no cyanosis. No edema. No clubbing ?Skin: No rashes or lesions. Or bruising ?Lymph: Cervical, supraclavicular normal. ?Neurologic: Grossly non-focal ?Pertinent Labs ?Lab Results ?CBC ?   ?Component Value Date/Time  ? WBC 17.7 (H) 10/12/2021 1219  ? RBC 4.58 10/12/2021 1219  ? HGB 13.4 10/12/2021 1219  ? HCT 39.8 10/12/2021 1219  ? PLT 315 10/12/2021 1219  ? MCV 86.9 10/12/2021 1219  ? MCH 29.3 10/12/2021 1219  ? MCHC 33.7 10/12/2021 1219  ? RDW 15.2 10/12/2021 1219  ? LYMPHSABS 1.6 10/12/2021 1219  ? MONOABS 1.0 10/12/2021 1219  ? EOSABS 0.1 10/12/2021 1219  ? BASOSABS 0.1 10/12/2021 1219  ? ? ? ?  Latest Ref Rng & Units 10/12/2021  ? 12:19 PM 10/11/2021  ? 10:19 AM  ?CMP  ?Glucose 70 - 99 mg/dL 116   111    ?BUN 8 - 23 mg/dL 22   24    ?Creatinine 0.61 - 1.24 mg/dL 1.18   1.20    ?Sodium 135 - 145 mmol/L 131   131     ?Potassium 3.5 - 5.1 mmol/L 4.3   3.8    ?Chloride 98 - 111 mmol/L 95   94    ?CO2 22 - 32 mmol/L 28   28    ?Calcium 8.9 - 10.3 mg/dL 8.0   8.3    ? ? ? ? ?Microbiology: ?No results found for this or any previous visit (from the pa

## 2021-10-13 DIAGNOSIS — R918 Other nonspecific abnormal finding of lung field: Secondary | ICD-10-CM | POA: Diagnosis not present

## 2021-10-13 DIAGNOSIS — M7138 Other bursal cyst, other site: Secondary | ICD-10-CM | POA: Diagnosis not present

## 2021-10-13 DIAGNOSIS — H5461 Unqualified visual loss, right eye, normal vision left eye: Secondary | ICD-10-CM | POA: Diagnosis not present

## 2021-10-13 DIAGNOSIS — R0902 Hypoxemia: Secondary | ICD-10-CM | POA: Diagnosis not present

## 2021-10-13 DIAGNOSIS — H4389 Other disorders of vitreous body: Secondary | ICD-10-CM | POA: Diagnosis not present

## 2021-10-13 DIAGNOSIS — M47812 Spondylosis without myelopathy or radiculopathy, cervical region: Secondary | ICD-10-CM | POA: Diagnosis not present

## 2021-10-13 DIAGNOSIS — H20051 Hypopyon, right eye: Secondary | ICD-10-CM | POA: Diagnosis not present

## 2021-10-13 DIAGNOSIS — R69 Illness, unspecified: Secondary | ICD-10-CM | POA: Diagnosis not present

## 2021-10-13 LAB — HEMOGLOBIN A1C
Hgb A1c MFr Bld: 5.7 % — ABNORMAL HIGH (ref 4.8–5.6)
Mean Plasma Glucose: 117 mg/dL

## 2021-10-13 LAB — HIV ANTIBODY (ROUTINE TESTING W REFLEX): HIV Screen 4th Generation wRfx: NONREACTIVE

## 2021-10-13 LAB — RPR: RPR Ser Ql: NONREACTIVE

## 2021-10-14 DIAGNOSIS — K529 Noninfective gastroenteritis and colitis, unspecified: Secondary | ICD-10-CM | POA: Diagnosis not present

## 2021-10-14 DIAGNOSIS — R7401 Elevation of levels of liver transaminase levels: Secondary | ICD-10-CM | POA: Diagnosis not present

## 2021-10-14 DIAGNOSIS — J90 Pleural effusion, not elsewhere classified: Secondary | ICD-10-CM | POA: Diagnosis not present

## 2021-10-14 DIAGNOSIS — R188 Other ascites: Secondary | ICD-10-CM | POA: Diagnosis not present

## 2021-10-14 DIAGNOSIS — I3139 Other pericardial effusion (noninflammatory): Secondary | ICD-10-CM | POA: Diagnosis not present

## 2021-10-14 DIAGNOSIS — A419 Sepsis, unspecified organism: Secondary | ICD-10-CM | POA: Diagnosis not present

## 2021-10-14 LAB — TOXOPLASMA ANTIBODIES- IGG AND  IGM
Toxoplasma Antibody- IgM: <3 AU/mL (ref 0.0–7.9)
Toxoplasma IgG Ratio: 75.8 IU/mL — ABNORMAL HIGH (ref 0.0–7.1)

## 2021-10-14 LAB — LYME DISEASE SEROLOGY W/REFLEX: Lyme Total Antibody EIA: NEGATIVE

## 2021-10-15 DIAGNOSIS — H5461 Unqualified visual loss, right eye, normal vision left eye: Secondary | ICD-10-CM | POA: Diagnosis not present

## 2021-10-15 DIAGNOSIS — H20051 Hypopyon, right eye: Secondary | ICD-10-CM | POA: Diagnosis not present

## 2021-10-15 DIAGNOSIS — I350 Nonrheumatic aortic (valve) stenosis: Secondary | ICD-10-CM | POA: Diagnosis not present

## 2021-10-15 DIAGNOSIS — H4389 Other disorders of vitreous body: Secondary | ICD-10-CM | POA: Diagnosis not present

## 2021-10-15 DIAGNOSIS — R69 Illness, unspecified: Secondary | ICD-10-CM | POA: Diagnosis not present

## 2021-10-16 DIAGNOSIS — I358 Other nonrheumatic aortic valve disorders: Secondary | ICD-10-CM | POA: Diagnosis not present

## 2021-10-16 DIAGNOSIS — Z792 Long term (current) use of antibiotics: Secondary | ICD-10-CM | POA: Diagnosis not present

## 2021-10-16 DIAGNOSIS — R7401 Elevation of levels of liver transaminase levels: Secondary | ICD-10-CM | POA: Diagnosis not present

## 2021-10-16 DIAGNOSIS — I351 Nonrheumatic aortic (valve) insufficiency: Secondary | ICD-10-CM | POA: Diagnosis not present

## 2021-10-16 DIAGNOSIS — H4419 Other endophthalmitis: Secondary | ICD-10-CM | POA: Diagnosis not present

## 2021-10-17 DIAGNOSIS — I351 Nonrheumatic aortic (valve) insufficiency: Secondary | ICD-10-CM | POA: Diagnosis not present

## 2021-10-17 DIAGNOSIS — Z792 Long term (current) use of antibiotics: Secondary | ICD-10-CM | POA: Diagnosis not present

## 2021-10-17 DIAGNOSIS — I358 Other nonrheumatic aortic valve disorders: Secondary | ICD-10-CM | POA: Diagnosis not present

## 2021-10-17 DIAGNOSIS — R7401 Elevation of levels of liver transaminase levels: Secondary | ICD-10-CM | POA: Diagnosis not present

## 2021-10-17 DIAGNOSIS — H4419 Other endophthalmitis: Secondary | ICD-10-CM | POA: Diagnosis not present

## 2021-10-17 LAB — CULTURE, BLOOD (ROUTINE X 2)
Culture: NO GROWTH
Culture: NO GROWTH
Special Requests: ADEQUATE

## 2021-10-18 DIAGNOSIS — I3139 Other pericardial effusion (noninflammatory): Secondary | ICD-10-CM | POA: Diagnosis not present

## 2021-10-18 DIAGNOSIS — R69 Illness, unspecified: Secondary | ICD-10-CM | POA: Diagnosis not present

## 2021-10-18 DIAGNOSIS — I358 Other nonrheumatic aortic valve disorders: Secondary | ICD-10-CM | POA: Diagnosis not present

## 2021-10-18 DIAGNOSIS — I38 Endocarditis, valve unspecified: Secondary | ICD-10-CM | POA: Diagnosis not present

## 2021-10-18 DIAGNOSIS — I10 Essential (primary) hypertension: Secondary | ICD-10-CM | POA: Diagnosis not present

## 2021-10-18 DIAGNOSIS — Z792 Long term (current) use of antibiotics: Secondary | ICD-10-CM | POA: Diagnosis not present

## 2021-10-18 DIAGNOSIS — I351 Nonrheumatic aortic (valve) insufficiency: Secondary | ICD-10-CM | POA: Diagnosis not present

## 2021-10-18 DIAGNOSIS — R7401 Elevation of levels of liver transaminase levels: Secondary | ICD-10-CM | POA: Diagnosis not present

## 2021-10-18 DIAGNOSIS — I352 Nonrheumatic aortic (valve) stenosis with insufficiency: Secondary | ICD-10-CM | POA: Diagnosis not present

## 2021-10-18 DIAGNOSIS — H4419 Other endophthalmitis: Secondary | ICD-10-CM | POA: Diagnosis not present

## 2021-10-18 DIAGNOSIS — Z01810 Encounter for preprocedural cardiovascular examination: Secondary | ICD-10-CM | POA: Diagnosis not present

## 2021-10-19 DIAGNOSIS — K029 Dental caries, unspecified: Secondary | ICD-10-CM | POA: Diagnosis not present

## 2021-10-19 DIAGNOSIS — R7401 Elevation of levels of liver transaminase levels: Secondary | ICD-10-CM | POA: Diagnosis not present

## 2021-10-19 DIAGNOSIS — I358 Other nonrheumatic aortic valve disorders: Secondary | ICD-10-CM | POA: Diagnosis not present

## 2021-10-19 DIAGNOSIS — K047 Periapical abscess without sinus: Secondary | ICD-10-CM | POA: Diagnosis not present

## 2021-10-19 DIAGNOSIS — H4419 Other endophthalmitis: Secondary | ICD-10-CM | POA: Diagnosis not present

## 2021-10-19 DIAGNOSIS — Z792 Long term (current) use of antibiotics: Secondary | ICD-10-CM | POA: Diagnosis not present

## 2021-10-19 DIAGNOSIS — R071 Chest pain on breathing: Secondary | ICD-10-CM | POA: Diagnosis not present

## 2021-10-19 DIAGNOSIS — I351 Nonrheumatic aortic (valve) insufficiency: Secondary | ICD-10-CM | POA: Diagnosis not present

## 2021-10-20 DIAGNOSIS — I3139 Other pericardial effusion (noninflammatory): Secondary | ICD-10-CM | POA: Diagnosis not present

## 2021-10-20 DIAGNOSIS — H4419 Other endophthalmitis: Secondary | ICD-10-CM | POA: Diagnosis not present

## 2021-10-20 DIAGNOSIS — R69 Illness, unspecified: Secondary | ICD-10-CM | POA: Diagnosis not present

## 2021-10-20 DIAGNOSIS — I358 Other nonrheumatic aortic valve disorders: Secondary | ICD-10-CM | POA: Diagnosis not present

## 2021-10-20 DIAGNOSIS — Z792 Long term (current) use of antibiotics: Secondary | ICD-10-CM | POA: Diagnosis not present

## 2021-10-20 DIAGNOSIS — I351 Nonrheumatic aortic (valve) insufficiency: Secondary | ICD-10-CM | POA: Diagnosis not present

## 2021-10-21 DIAGNOSIS — R69 Illness, unspecified: Secondary | ICD-10-CM | POA: Diagnosis not present

## 2021-10-21 DIAGNOSIS — I3139 Other pericardial effusion (noninflammatory): Secondary | ICD-10-CM | POA: Diagnosis not present

## 2021-10-21 DIAGNOSIS — I351 Nonrheumatic aortic (valve) insufficiency: Secondary | ICD-10-CM | POA: Diagnosis not present

## 2021-10-21 DIAGNOSIS — Z792 Long term (current) use of antibiotics: Secondary | ICD-10-CM | POA: Diagnosis not present

## 2021-10-21 DIAGNOSIS — H4419 Other endophthalmitis: Secondary | ICD-10-CM | POA: Diagnosis not present

## 2021-10-21 DIAGNOSIS — I358 Other nonrheumatic aortic valve disorders: Secondary | ICD-10-CM | POA: Diagnosis not present

## 2021-10-22 DIAGNOSIS — I330 Acute and subacute infective endocarditis: Secondary | ICD-10-CM | POA: Diagnosis not present

## 2021-10-22 DIAGNOSIS — R071 Chest pain on breathing: Secondary | ICD-10-CM | POA: Diagnosis not present

## 2021-10-22 DIAGNOSIS — J9811 Atelectasis: Secondary | ICD-10-CM | POA: Diagnosis not present

## 2021-10-22 DIAGNOSIS — I358 Other nonrheumatic aortic valve disorders: Secondary | ICD-10-CM | POA: Diagnosis not present

## 2021-10-22 DIAGNOSIS — Q231 Congenital insufficiency of aortic valve: Secondary | ICD-10-CM | POA: Diagnosis not present

## 2021-10-22 DIAGNOSIS — I359 Nonrheumatic aortic valve disorder, unspecified: Secondary | ICD-10-CM | POA: Diagnosis not present

## 2021-10-22 DIAGNOSIS — I351 Nonrheumatic aortic (valve) insufficiency: Secondary | ICD-10-CM | POA: Diagnosis not present

## 2021-10-22 DIAGNOSIS — J90 Pleural effusion, not elsewhere classified: Secondary | ICD-10-CM | POA: Diagnosis not present

## 2021-10-22 DIAGNOSIS — H4419 Other endophthalmitis: Secondary | ICD-10-CM | POA: Diagnosis not present

## 2021-10-22 DIAGNOSIS — R69 Illness, unspecified: Secondary | ICD-10-CM | POA: Diagnosis not present

## 2021-10-22 DIAGNOSIS — Z4682 Encounter for fitting and adjustment of non-vascular catheter: Secondary | ICD-10-CM | POA: Diagnosis not present

## 2021-10-22 DIAGNOSIS — I350 Nonrheumatic aortic (valve) stenosis: Secondary | ICD-10-CM | POA: Diagnosis not present

## 2021-10-22 DIAGNOSIS — I719 Aortic aneurysm of unspecified site, without rupture: Secondary | ICD-10-CM | POA: Diagnosis not present

## 2021-10-22 DIAGNOSIS — I7789 Other specified disorders of arteries and arterioles: Secondary | ICD-10-CM | POA: Diagnosis not present

## 2021-10-22 DIAGNOSIS — I3139 Other pericardial effusion (noninflammatory): Secondary | ICD-10-CM | POA: Diagnosis not present

## 2021-10-23 DIAGNOSIS — Z4682 Encounter for fitting and adjustment of non-vascular catheter: Secondary | ICD-10-CM | POA: Diagnosis not present

## 2021-10-23 DIAGNOSIS — R071 Chest pain on breathing: Secondary | ICD-10-CM | POA: Diagnosis not present

## 2021-10-23 DIAGNOSIS — I330 Acute and subacute infective endocarditis: Secondary | ICD-10-CM | POA: Diagnosis not present

## 2021-10-23 DIAGNOSIS — J9811 Atelectasis: Secondary | ICD-10-CM | POA: Diagnosis not present

## 2021-10-23 DIAGNOSIS — J811 Chronic pulmonary edema: Secondary | ICD-10-CM | POA: Diagnosis not present

## 2021-10-23 DIAGNOSIS — J90 Pleural effusion, not elsewhere classified: Secondary | ICD-10-CM | POA: Diagnosis not present

## 2021-10-23 DIAGNOSIS — I359 Nonrheumatic aortic valve disorder, unspecified: Secondary | ICD-10-CM | POA: Diagnosis not present

## 2021-10-23 DIAGNOSIS — I10 Essential (primary) hypertension: Secondary | ICD-10-CM | POA: Diagnosis not present

## 2021-10-23 DIAGNOSIS — I719 Aortic aneurysm of unspecified site, without rupture: Secondary | ICD-10-CM | POA: Diagnosis not present

## 2021-10-23 DIAGNOSIS — I358 Other nonrheumatic aortic valve disorders: Secondary | ICD-10-CM | POA: Diagnosis not present

## 2021-10-23 DIAGNOSIS — K660 Peritoneal adhesions (postprocedural) (postinfection): Secondary | ICD-10-CM | POA: Diagnosis not present

## 2021-10-23 DIAGNOSIS — I350 Nonrheumatic aortic (valve) stenosis: Secondary | ICD-10-CM | POA: Diagnosis not present

## 2021-10-23 DIAGNOSIS — J95821 Acute postprocedural respiratory failure: Secondary | ICD-10-CM | POA: Diagnosis not present

## 2021-10-23 DIAGNOSIS — R109 Unspecified abdominal pain: Secondary | ICD-10-CM | POA: Diagnosis not present

## 2021-10-23 DIAGNOSIS — Z952 Presence of prosthetic heart valve: Secondary | ICD-10-CM | POA: Diagnosis not present

## 2021-10-24 DIAGNOSIS — J811 Chronic pulmonary edema: Secondary | ICD-10-CM | POA: Diagnosis not present

## 2021-10-24 DIAGNOSIS — I330 Acute and subacute infective endocarditis: Secondary | ICD-10-CM | POA: Diagnosis not present

## 2021-10-24 DIAGNOSIS — Z952 Presence of prosthetic heart valve: Secondary | ICD-10-CM | POA: Diagnosis not present

## 2021-10-24 DIAGNOSIS — I10 Essential (primary) hypertension: Secondary | ICD-10-CM | POA: Diagnosis not present

## 2021-10-24 DIAGNOSIS — K559 Vascular disorder of intestine, unspecified: Secondary | ICD-10-CM | POA: Diagnosis not present

## 2021-10-24 DIAGNOSIS — J90 Pleural effusion, not elsewhere classified: Secondary | ICD-10-CM | POA: Diagnosis not present

## 2021-10-24 DIAGNOSIS — J95821 Acute postprocedural respiratory failure: Secondary | ICD-10-CM | POA: Diagnosis not present

## 2021-10-24 DIAGNOSIS — I350 Nonrheumatic aortic (valve) stenosis: Secondary | ICD-10-CM | POA: Diagnosis not present

## 2021-10-24 DIAGNOSIS — J9811 Atelectasis: Secondary | ICD-10-CM | POA: Diagnosis not present

## 2021-10-25 DIAGNOSIS — R071 Chest pain on breathing: Secondary | ICD-10-CM | POA: Diagnosis not present

## 2021-10-25 DIAGNOSIS — K839 Disease of biliary tract, unspecified: Secondary | ICD-10-CM | POA: Diagnosis not present

## 2021-10-25 DIAGNOSIS — I10 Essential (primary) hypertension: Secondary | ICD-10-CM | POA: Diagnosis not present

## 2021-10-25 DIAGNOSIS — I350 Nonrheumatic aortic (valve) stenosis: Secondary | ICD-10-CM | POA: Diagnosis not present

## 2021-10-25 DIAGNOSIS — K5939 Other megacolon: Secondary | ICD-10-CM | POA: Diagnosis not present

## 2021-10-25 DIAGNOSIS — Z952 Presence of prosthetic heart valve: Secondary | ICD-10-CM | POA: Diagnosis not present

## 2021-10-25 DIAGNOSIS — K760 Fatty (change of) liver, not elsewhere classified: Secondary | ICD-10-CM | POA: Diagnosis not present

## 2021-10-25 DIAGNOSIS — R188 Other ascites: Secondary | ICD-10-CM | POA: Diagnosis not present

## 2021-10-25 DIAGNOSIS — I330 Acute and subacute infective endocarditis: Secondary | ICD-10-CM | POA: Diagnosis not present

## 2021-10-25 DIAGNOSIS — Z4682 Encounter for fitting and adjustment of non-vascular catheter: Secondary | ICD-10-CM | POA: Diagnosis not present

## 2021-10-25 DIAGNOSIS — J95821 Acute postprocedural respiratory failure: Secondary | ICD-10-CM | POA: Diagnosis not present

## 2021-10-25 DIAGNOSIS — J9811 Atelectasis: Secondary | ICD-10-CM | POA: Diagnosis not present

## 2021-10-25 DIAGNOSIS — J90 Pleural effusion, not elsewhere classified: Secondary | ICD-10-CM | POA: Diagnosis not present

## 2021-10-26 DIAGNOSIS — J9811 Atelectasis: Secondary | ICD-10-CM | POA: Diagnosis not present

## 2021-10-26 DIAGNOSIS — Z95828 Presence of other vascular implants and grafts: Secondary | ICD-10-CM | POA: Diagnosis not present

## 2021-10-26 DIAGNOSIS — J90 Pleural effusion, not elsewhere classified: Secondary | ICD-10-CM | POA: Diagnosis not present

## 2021-10-27 DIAGNOSIS — Z952 Presence of prosthetic heart valve: Secondary | ICD-10-CM | POA: Diagnosis not present

## 2021-10-27 DIAGNOSIS — J9811 Atelectasis: Secondary | ICD-10-CM | POA: Diagnosis not present

## 2021-10-27 DIAGNOSIS — J811 Chronic pulmonary edema: Secondary | ICD-10-CM | POA: Diagnosis not present

## 2021-10-28 DIAGNOSIS — I359 Nonrheumatic aortic valve disorder, unspecified: Secondary | ICD-10-CM | POA: Diagnosis not present

## 2021-10-28 DIAGNOSIS — R071 Chest pain on breathing: Secondary | ICD-10-CM | POA: Diagnosis not present

## 2021-10-29 DIAGNOSIS — R071 Chest pain on breathing: Secondary | ICD-10-CM | POA: Diagnosis not present

## 2021-10-29 DIAGNOSIS — J948 Other specified pleural conditions: Secondary | ICD-10-CM | POA: Diagnosis not present

## 2021-10-29 DIAGNOSIS — Z952 Presence of prosthetic heart valve: Secondary | ICD-10-CM | POA: Diagnosis not present

## 2021-10-29 DIAGNOSIS — Z9889 Other specified postprocedural states: Secondary | ICD-10-CM | POA: Diagnosis not present

## 2021-10-30 DIAGNOSIS — J90 Pleural effusion, not elsewhere classified: Secondary | ICD-10-CM | POA: Diagnosis not present

## 2021-10-30 DIAGNOSIS — Z952 Presence of prosthetic heart valve: Secondary | ICD-10-CM | POA: Diagnosis not present

## 2021-10-30 DIAGNOSIS — R918 Other nonspecific abnormal finding of lung field: Secondary | ICD-10-CM | POA: Diagnosis not present

## 2021-10-30 DIAGNOSIS — J939 Pneumothorax, unspecified: Secondary | ICD-10-CM | POA: Diagnosis not present

## 2021-10-31 DIAGNOSIS — J939 Pneumothorax, unspecified: Secondary | ICD-10-CM | POA: Diagnosis not present

## 2021-10-31 DIAGNOSIS — Z952 Presence of prosthetic heart valve: Secondary | ICD-10-CM | POA: Diagnosis not present

## 2021-10-31 DIAGNOSIS — J90 Pleural effusion, not elsewhere classified: Secondary | ICD-10-CM | POA: Diagnosis not present

## 2021-10-31 DIAGNOSIS — R071 Chest pain on breathing: Secondary | ICD-10-CM | POA: Diagnosis not present

## 2021-11-01 DIAGNOSIS — J939 Pneumothorax, unspecified: Secondary | ICD-10-CM | POA: Diagnosis not present

## 2021-11-01 DIAGNOSIS — Z95828 Presence of other vascular implants and grafts: Secondary | ICD-10-CM | POA: Diagnosis not present

## 2021-11-01 DIAGNOSIS — J90 Pleural effusion, not elsewhere classified: Secondary | ICD-10-CM | POA: Diagnosis not present

## 2021-11-01 DIAGNOSIS — J9811 Atelectasis: Secondary | ICD-10-CM | POA: Diagnosis not present

## 2021-11-02 DIAGNOSIS — J9811 Atelectasis: Secondary | ICD-10-CM | POA: Diagnosis not present

## 2021-11-02 DIAGNOSIS — J90 Pleural effusion, not elsewhere classified: Secondary | ICD-10-CM | POA: Diagnosis not present

## 2021-11-02 DIAGNOSIS — Z95828 Presence of other vascular implants and grafts: Secondary | ICD-10-CM | POA: Diagnosis not present

## 2021-11-03 DIAGNOSIS — H4419 Other endophthalmitis: Secondary | ICD-10-CM | POA: Diagnosis not present

## 2021-11-03 DIAGNOSIS — I330 Acute and subacute infective endocarditis: Secondary | ICD-10-CM | POA: Diagnosis not present

## 2021-11-05 DIAGNOSIS — I10 Essential (primary) hypertension: Secondary | ICD-10-CM | POA: Diagnosis not present

## 2021-11-05 DIAGNOSIS — R69 Illness, unspecified: Secondary | ICD-10-CM | POA: Diagnosis not present

## 2021-11-06 DIAGNOSIS — R69 Illness, unspecified: Secondary | ICD-10-CM | POA: Diagnosis not present

## 2021-11-06 DIAGNOSIS — H5461 Unqualified visual loss, right eye, normal vision left eye: Secondary | ICD-10-CM | POA: Diagnosis not present

## 2021-11-06 DIAGNOSIS — H5711 Ocular pain, right eye: Secondary | ICD-10-CM | POA: Diagnosis not present

## 2021-11-06 DIAGNOSIS — H44001 Unspecified purulent endophthalmitis, right eye: Secondary | ICD-10-CM | POA: Diagnosis not present

## 2021-11-06 DIAGNOSIS — I10 Essential (primary) hypertension: Secondary | ICD-10-CM | POA: Diagnosis not present

## 2021-11-09 DIAGNOSIS — Z95828 Presence of other vascular implants and grafts: Secondary | ICD-10-CM | POA: Diagnosis not present

## 2021-11-09 DIAGNOSIS — Z952 Presence of prosthetic heart valve: Secondary | ICD-10-CM | POA: Diagnosis not present

## 2021-11-09 DIAGNOSIS — Z48812 Encounter for surgical aftercare following surgery on the circulatory system: Secondary | ICD-10-CM | POA: Diagnosis not present

## 2021-11-10 ENCOUNTER — Other Ambulatory Visit: Payer: Self-pay | Admitting: Otolaryngology

## 2021-11-10 DIAGNOSIS — D37030 Neoplasm of uncertain behavior of the parotid salivary glands: Secondary | ICD-10-CM

## 2021-11-10 DIAGNOSIS — Z09 Encounter for follow-up examination after completed treatment for conditions other than malignant neoplasm: Secondary | ICD-10-CM | POA: Diagnosis not present

## 2021-11-16 DIAGNOSIS — Z952 Presence of prosthetic heart valve: Secondary | ICD-10-CM | POA: Diagnosis not present

## 2021-11-16 DIAGNOSIS — Z79899 Other long term (current) drug therapy: Secondary | ICD-10-CM | POA: Diagnosis not present

## 2021-11-16 DIAGNOSIS — Z792 Long term (current) use of antibiotics: Secondary | ICD-10-CM | POA: Diagnosis not present

## 2021-11-16 DIAGNOSIS — Z95828 Presence of other vascular implants and grafts: Secondary | ICD-10-CM | POA: Diagnosis not present

## 2021-11-16 DIAGNOSIS — I330 Acute and subacute infective endocarditis: Secondary | ICD-10-CM | POA: Diagnosis not present

## 2021-11-16 DIAGNOSIS — Z1331 Encounter for screening for depression: Secondary | ICD-10-CM | POA: Diagnosis not present

## 2021-11-17 ENCOUNTER — Ambulatory Visit
Admission: RE | Admit: 2021-11-17 | Discharge: 2021-11-17 | Disposition: A | Discharge status: Home / Self Care | Payer: 59 | Source: Ambulatory Visit | Attending: Otolaryngology | Admitting: Otolaryngology

## 2021-11-17 DIAGNOSIS — R590 Localized enlarged lymph nodes: Secondary | ICD-10-CM | POA: Diagnosis not present

## 2021-11-17 DIAGNOSIS — D37030 Neoplasm of uncertain behavior of the parotid salivary glands: Secondary | ICD-10-CM

## 2021-11-17 IMAGING — US US SOFT TISSUE HEAD/NECK
1 series · 14 of 16 positions shown · non-contrast
Comparison: None Available.

CLINICAL DATA: 65-year-old male with a history of parotid gland
mass

EXAM:
ULTRASOUND OF HEAD/NECK SOFT TISSUES
TECHNIQUE: Ultrasound examination of the head and neck soft tissues was
performed in the area of clinical concern.

[Series 1: us soft tissue head/neck · 0.05mm/px · 14 of 16 slices shown]
[im 1/16]
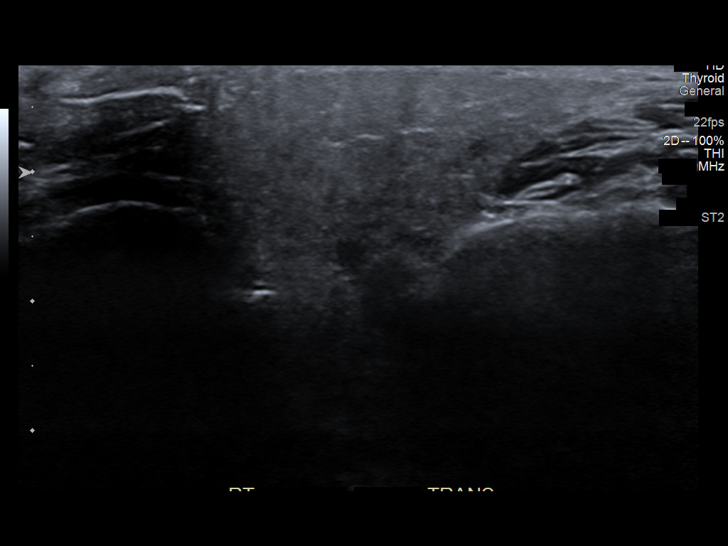
[im 2/16]
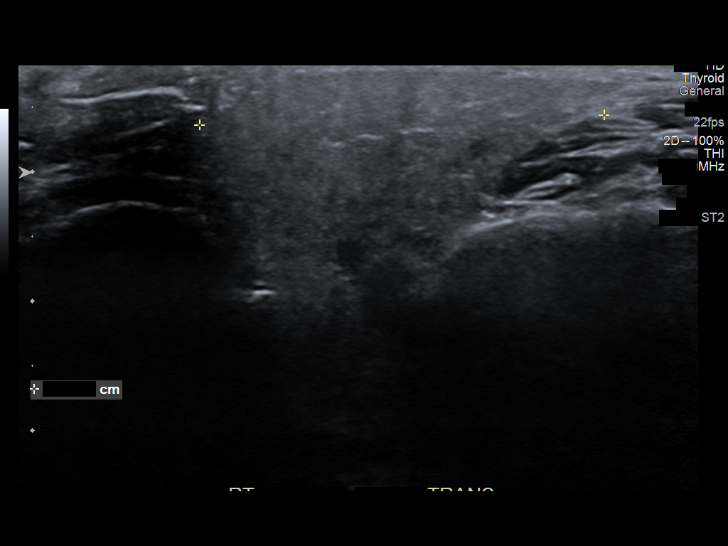
[im 3/16]
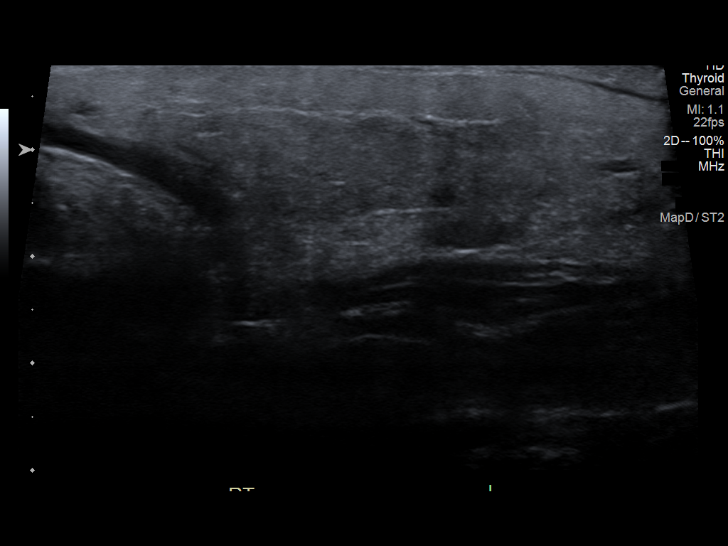
[im 5/16]
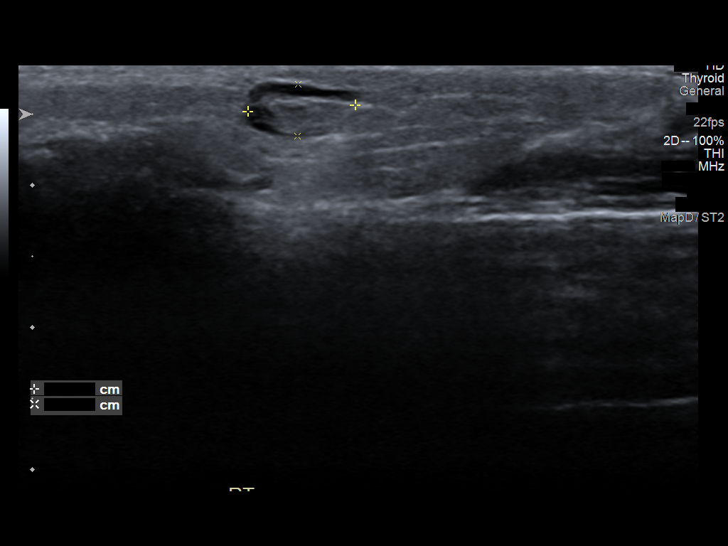
[im 6/16]
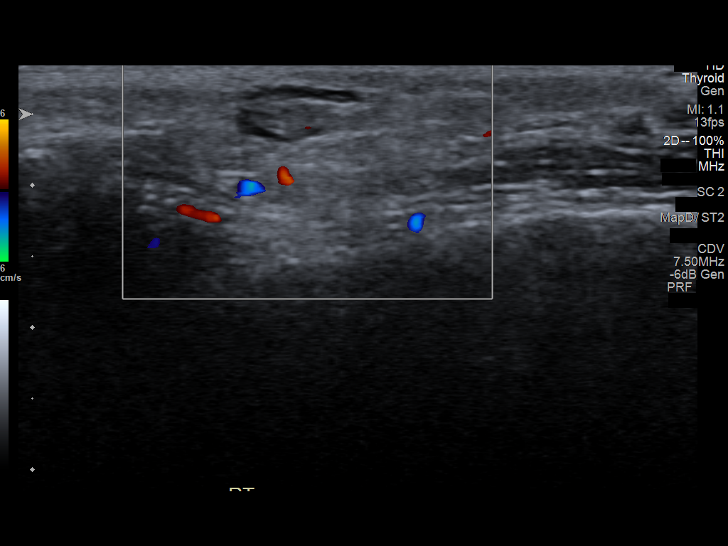
[im 7/16]
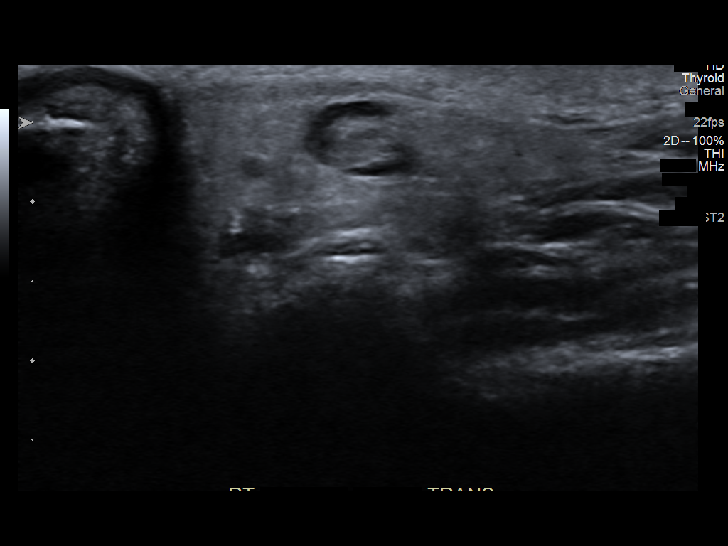
[im 8/16]
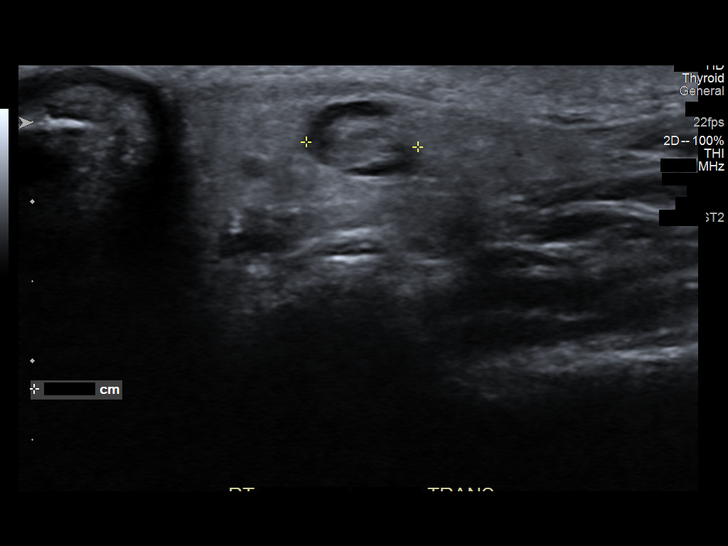
[im 9/16]
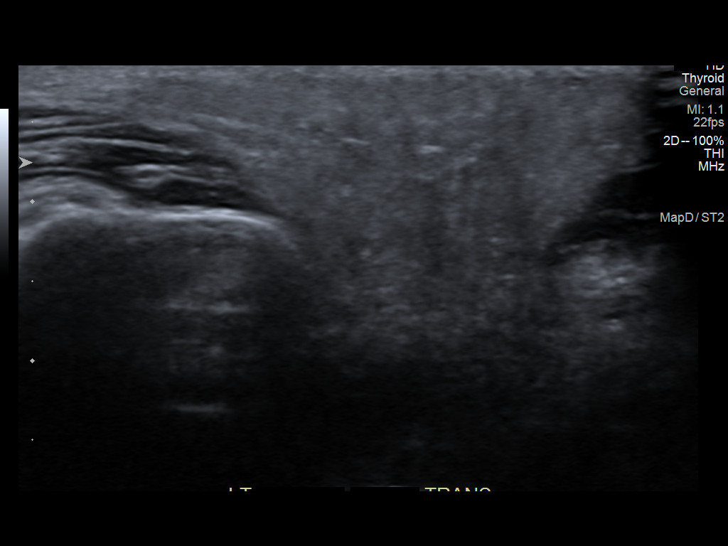
[im 10/16]
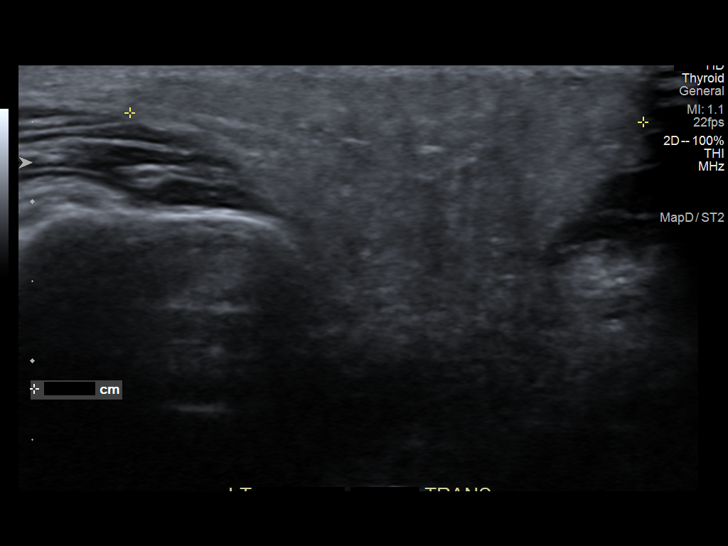
[im 11/16]
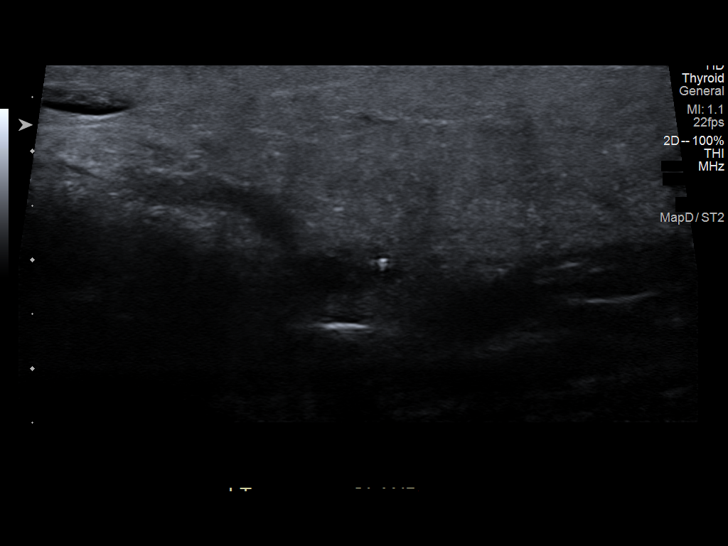
[im 13/16]
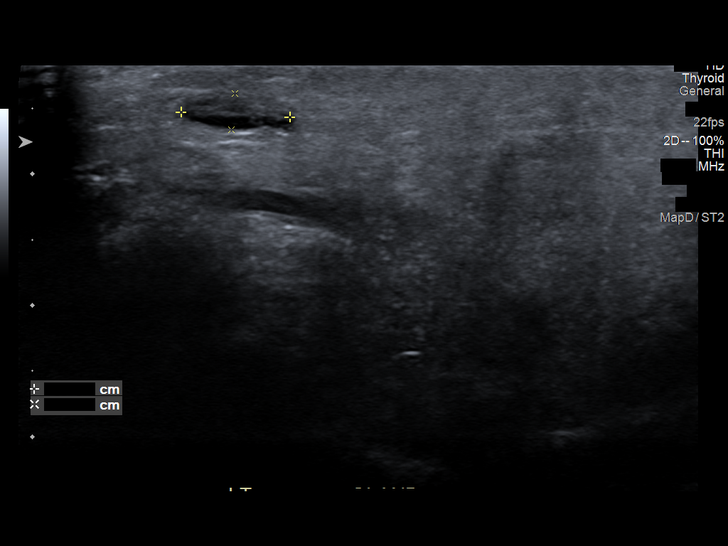
[im 14/16]
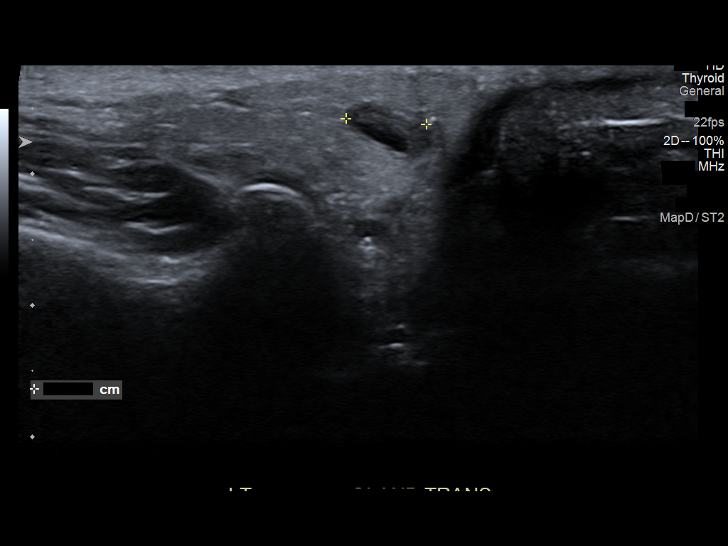
[im 15/16]
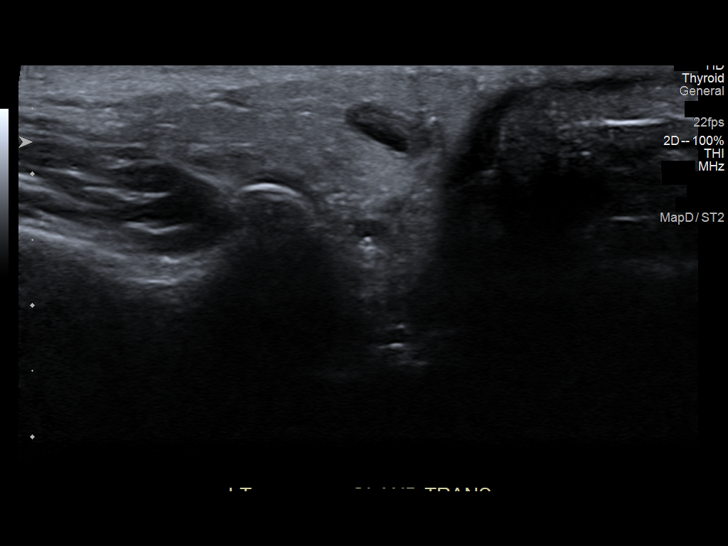
[im 16/16]
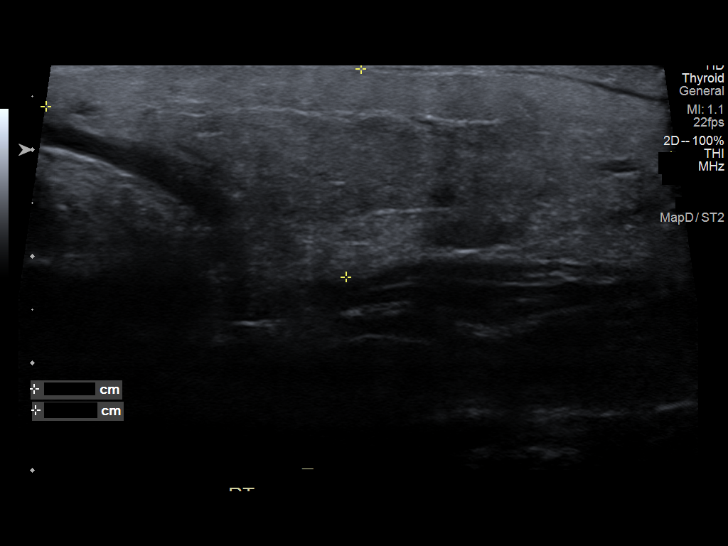

[14 of 16 positions shown; findings below may reference images not displayed]

FINDINGS: Grayscale and color duplex performed in the region of clinical
concern, bilateral parotid glands.

On the right, there is uniform echogenicity of the parotid tissue,
with no hyperechoic/echogenic foci.

There is an intraparotid lymph node with typical architecture
maintained, not enlarged measuring 7 mm short axis.

On the left there is uniform echogenicity of the parotid tissue. No
hyperechoic or echogenic foci.

Intraparotid lymph node on the left, with typical architecture and
short axis dimension of 3 mm.

No focal fluid.
IMPRESSION: Symmetric and relatively unremarkable appearance of bilateral
parotid glands/parotid tissue.

Small bilateral intraparotid lymph nodes, with typical architecture,
nonspecific though potentially reactive.

## 2021-11-18 DIAGNOSIS — I739 Peripheral vascular disease, unspecified: Secondary | ICD-10-CM | POA: Diagnosis not present

## 2021-11-18 DIAGNOSIS — B49 Unspecified mycosis: Secondary | ICD-10-CM | POA: Diagnosis not present

## 2021-11-18 DIAGNOSIS — H5704 Mydriasis: Secondary | ICD-10-CM | POA: Diagnosis not present

## 2021-11-18 DIAGNOSIS — H44431 Hypotony of eye due to other ocular disorders, right eye: Secondary | ICD-10-CM | POA: Diagnosis not present

## 2021-11-18 DIAGNOSIS — R5383 Other fatigue: Secondary | ICD-10-CM | POA: Diagnosis not present

## 2021-11-18 DIAGNOSIS — H02401 Unspecified ptosis of right eyelid: Secondary | ICD-10-CM | POA: Diagnosis not present

## 2021-11-18 DIAGNOSIS — H4419 Other endophthalmitis: Secondary | ICD-10-CM | POA: Diagnosis not present

## 2021-11-18 DIAGNOSIS — Z86010 Personal history of colonic polyps: Secondary | ICD-10-CM | POA: Diagnosis not present

## 2021-11-18 DIAGNOSIS — Z821 Family history of blindness and visual loss: Secondary | ICD-10-CM | POA: Diagnosis not present

## 2021-11-18 DIAGNOSIS — J90 Pleural effusion, not elsewhere classified: Secondary | ICD-10-CM | POA: Diagnosis not present

## 2021-11-18 DIAGNOSIS — Z952 Presence of prosthetic heart valve: Secondary | ICD-10-CM | POA: Diagnosis not present

## 2021-11-18 DIAGNOSIS — J9811 Atelectasis: Secondary | ICD-10-CM | POA: Diagnosis not present

## 2021-11-18 DIAGNOSIS — M542 Cervicalgia: Secondary | ICD-10-CM | POA: Diagnosis not present

## 2021-11-18 DIAGNOSIS — I4510 Unspecified right bundle-branch block: Secondary | ICD-10-CM | POA: Diagnosis not present

## 2021-11-18 DIAGNOSIS — H43391 Other vitreous opacities, right eye: Secondary | ICD-10-CM | POA: Diagnosis not present

## 2021-11-18 DIAGNOSIS — H43811 Vitreous degeneration, right eye: Secondary | ICD-10-CM | POA: Diagnosis not present

## 2021-11-18 DIAGNOSIS — Z7982 Long term (current) use of aspirin: Secondary | ICD-10-CM | POA: Diagnosis not present

## 2021-11-18 DIAGNOSIS — Z95828 Presence of other vascular implants and grafts: Secondary | ICD-10-CM | POA: Diagnosis not present

## 2021-11-18 DIAGNOSIS — H4389 Other disorders of vitreous body: Secondary | ICD-10-CM | POA: Diagnosis not present

## 2021-11-18 DIAGNOSIS — H269 Unspecified cataract: Secondary | ICD-10-CM | POA: Diagnosis not present

## 2021-11-18 DIAGNOSIS — R9431 Abnormal electrocardiogram [ECG] [EKG]: Secondary | ICD-10-CM | POA: Diagnosis not present

## 2021-11-18 DIAGNOSIS — Z79899 Other long term (current) drug therapy: Secondary | ICD-10-CM | POA: Diagnosis not present

## 2021-11-18 DIAGNOSIS — H33001 Unspecified retinal detachment with retinal break, right eye: Secondary | ICD-10-CM | POA: Diagnosis not present

## 2021-11-18 DIAGNOSIS — H2513 Age-related nuclear cataract, bilateral: Secondary | ICD-10-CM | POA: Diagnosis not present

## 2021-11-19 DIAGNOSIS — Z952 Presence of prosthetic heart valve: Secondary | ICD-10-CM | POA: Diagnosis not present

## 2021-11-19 DIAGNOSIS — Z95828 Presence of other vascular implants and grafts: Secondary | ICD-10-CM | POA: Diagnosis not present

## 2021-11-20 DIAGNOSIS — Z95828 Presence of other vascular implants and grafts: Secondary | ICD-10-CM | POA: Diagnosis not present

## 2021-11-20 DIAGNOSIS — Z952 Presence of prosthetic heart valve: Secondary | ICD-10-CM | POA: Diagnosis not present

## 2021-11-21 DIAGNOSIS — Z95828 Presence of other vascular implants and grafts: Secondary | ICD-10-CM | POA: Diagnosis not present

## 2021-11-21 DIAGNOSIS — Z952 Presence of prosthetic heart valve: Secondary | ICD-10-CM | POA: Diagnosis not present

## 2021-11-22 DIAGNOSIS — Z952 Presence of prosthetic heart valve: Secondary | ICD-10-CM | POA: Diagnosis not present

## 2021-11-22 DIAGNOSIS — Z0189 Encounter for other specified special examinations: Secondary | ICD-10-CM | POA: Diagnosis not present

## 2021-11-22 DIAGNOSIS — Z95828 Presence of other vascular implants and grafts: Secondary | ICD-10-CM | POA: Diagnosis not present

## 2021-11-23 DIAGNOSIS — Z95828 Presence of other vascular implants and grafts: Secondary | ICD-10-CM | POA: Diagnosis not present

## 2021-11-23 DIAGNOSIS — Z952 Presence of prosthetic heart valve: Secondary | ICD-10-CM | POA: Diagnosis not present

## 2021-11-24 DIAGNOSIS — Z952 Presence of prosthetic heart valve: Secondary | ICD-10-CM | POA: Diagnosis not present

## 2021-11-24 DIAGNOSIS — Z95828 Presence of other vascular implants and grafts: Secondary | ICD-10-CM | POA: Diagnosis not present

## 2021-11-25 DIAGNOSIS — Z952 Presence of prosthetic heart valve: Secondary | ICD-10-CM | POA: Diagnosis not present

## 2021-11-25 DIAGNOSIS — H4389 Other disorders of vitreous body: Secondary | ICD-10-CM | POA: Diagnosis not present

## 2021-11-25 DIAGNOSIS — H4419 Other endophthalmitis: Secondary | ICD-10-CM | POA: Diagnosis not present

## 2021-11-25 DIAGNOSIS — Z95828 Presence of other vascular implants and grafts: Secondary | ICD-10-CM | POA: Diagnosis not present

## 2021-11-25 DIAGNOSIS — B49 Unspecified mycosis: Secondary | ICD-10-CM | POA: Diagnosis not present

## 2021-11-25 DIAGNOSIS — H44431 Hypotony of eye due to other ocular disorders, right eye: Secondary | ICD-10-CM | POA: Diagnosis not present

## 2021-11-26 DIAGNOSIS — F1490 Cocaine use, unspecified, uncomplicated: Secondary | ICD-10-CM | POA: Diagnosis not present

## 2021-11-26 DIAGNOSIS — I10 Essential (primary) hypertension: Secondary | ICD-10-CM | POA: Diagnosis not present

## 2021-11-26 DIAGNOSIS — R69 Illness, unspecified: Secondary | ICD-10-CM | POA: Diagnosis not present

## 2021-11-26 DIAGNOSIS — I6381 Other cerebral infarction due to occlusion or stenosis of small artery: Secondary | ICD-10-CM | POA: Diagnosis not present

## 2021-11-30 DIAGNOSIS — Z95828 Presence of other vascular implants and grafts: Secondary | ICD-10-CM | POA: Diagnosis not present

## 2021-11-30 DIAGNOSIS — Z0189 Encounter for other specified special examinations: Secondary | ICD-10-CM | POA: Diagnosis not present

## 2021-11-30 DIAGNOSIS — Z952 Presence of prosthetic heart valve: Secondary | ICD-10-CM | POA: Diagnosis not present

## 2021-12-02 DIAGNOSIS — Z95828 Presence of other vascular implants and grafts: Secondary | ICD-10-CM | POA: Diagnosis not present

## 2021-12-02 DIAGNOSIS — Z952 Presence of prosthetic heart valve: Secondary | ICD-10-CM | POA: Diagnosis not present

## 2021-12-03 DIAGNOSIS — Z95828 Presence of other vascular implants and grafts: Secondary | ICD-10-CM | POA: Diagnosis not present

## 2021-12-03 DIAGNOSIS — Z952 Presence of prosthetic heart valve: Secondary | ICD-10-CM | POA: Diagnosis not present

## 2021-12-07 DIAGNOSIS — R001 Bradycardia, unspecified: Secondary | ICD-10-CM | POA: Diagnosis not present

## 2021-12-09 DIAGNOSIS — H44431 Hypotony of eye due to other ocular disorders, right eye: Secondary | ICD-10-CM | POA: Diagnosis not present

## 2021-12-09 DIAGNOSIS — Z95828 Presence of other vascular implants and grafts: Secondary | ICD-10-CM | POA: Diagnosis not present

## 2021-12-09 DIAGNOSIS — I4891 Unspecified atrial fibrillation: Secondary | ICD-10-CM | POA: Diagnosis not present

## 2021-12-09 DIAGNOSIS — I9789 Other postprocedural complications and disorders of the circulatory system, not elsewhere classified: Secondary | ICD-10-CM | POA: Diagnosis not present

## 2021-12-09 DIAGNOSIS — H4389 Other disorders of vitreous body: Secondary | ICD-10-CM | POA: Diagnosis not present

## 2021-12-09 DIAGNOSIS — I330 Acute and subacute infective endocarditis: Secondary | ICD-10-CM | POA: Diagnosis not present

## 2021-12-09 DIAGNOSIS — I10 Essential (primary) hypertension: Secondary | ICD-10-CM | POA: Diagnosis not present

## 2021-12-09 DIAGNOSIS — H4419 Other endophthalmitis: Secondary | ICD-10-CM | POA: Diagnosis not present

## 2021-12-09 DIAGNOSIS — B49 Unspecified mycosis: Secondary | ICD-10-CM | POA: Diagnosis not present

## 2021-12-09 DIAGNOSIS — E785 Hyperlipidemia, unspecified: Secondary | ICD-10-CM | POA: Diagnosis not present

## 2021-12-09 DIAGNOSIS — Z952 Presence of prosthetic heart valve: Secondary | ICD-10-CM | POA: Diagnosis not present

## 2021-12-09 DIAGNOSIS — H5461 Unqualified visual loss, right eye, normal vision left eye: Secondary | ICD-10-CM | POA: Diagnosis not present

## 2021-12-10 DIAGNOSIS — I480 Paroxysmal atrial fibrillation: Secondary | ICD-10-CM | POA: Diagnosis not present

## 2021-12-10 DIAGNOSIS — I351 Nonrheumatic aortic (valve) insufficiency: Secondary | ICD-10-CM | POA: Diagnosis not present

## 2021-12-20 DIAGNOSIS — I4891 Unspecified atrial fibrillation: Secondary | ICD-10-CM | POA: Diagnosis not present

## 2021-12-20 DIAGNOSIS — I10 Essential (primary) hypertension: Secondary | ICD-10-CM | POA: Diagnosis not present

## 2021-12-20 DIAGNOSIS — E785 Hyperlipidemia, unspecified: Secondary | ICD-10-CM | POA: Diagnosis not present

## 2021-12-20 DIAGNOSIS — I9789 Other postprocedural complications and disorders of the circulatory system, not elsewhere classified: Secondary | ICD-10-CM | POA: Diagnosis not present

## 2021-12-20 DIAGNOSIS — I330 Acute and subacute infective endocarditis: Secondary | ICD-10-CM | POA: Diagnosis not present

## 2021-12-20 DIAGNOSIS — Z952 Presence of prosthetic heart valve: Secondary | ICD-10-CM | POA: Diagnosis not present

## 2021-12-20 DIAGNOSIS — Z95828 Presence of other vascular implants and grafts: Secondary | ICD-10-CM | POA: Diagnosis not present

## 2021-12-23 DIAGNOSIS — I4891 Unspecified atrial fibrillation: Secondary | ICD-10-CM | POA: Diagnosis not present

## 2021-12-23 DIAGNOSIS — E785 Hyperlipidemia, unspecified: Secondary | ICD-10-CM | POA: Diagnosis not present

## 2021-12-23 DIAGNOSIS — R200 Anesthesia of skin: Secondary | ICD-10-CM | POA: Diagnosis not present

## 2021-12-23 DIAGNOSIS — B351 Tinea unguium: Secondary | ICD-10-CM | POA: Diagnosis not present

## 2021-12-23 DIAGNOSIS — H5461 Unqualified visual loss, right eye, normal vision left eye: Secondary | ICD-10-CM | POA: Diagnosis not present

## 2021-12-23 DIAGNOSIS — I10 Essential (primary) hypertension: Secondary | ICD-10-CM | POA: Diagnosis not present

## 2021-12-23 DIAGNOSIS — H4389 Other disorders of vitreous body: Secondary | ICD-10-CM | POA: Diagnosis not present

## 2021-12-23 DIAGNOSIS — B49 Unspecified mycosis: Secondary | ICD-10-CM | POA: Diagnosis not present

## 2021-12-23 DIAGNOSIS — Z952 Presence of prosthetic heart valve: Secondary | ICD-10-CM | POA: Diagnosis not present

## 2021-12-23 DIAGNOSIS — H44431 Hypotony of eye due to other ocular disorders, right eye: Secondary | ICD-10-CM | POA: Diagnosis not present

## 2021-12-23 DIAGNOSIS — Z95828 Presence of other vascular implants and grafts: Secondary | ICD-10-CM | POA: Diagnosis not present

## 2021-12-23 DIAGNOSIS — I330 Acute and subacute infective endocarditis: Secondary | ICD-10-CM | POA: Diagnosis not present

## 2021-12-23 DIAGNOSIS — I9789 Other postprocedural complications and disorders of the circulatory system, not elsewhere classified: Secondary | ICD-10-CM | POA: Diagnosis not present

## 2021-12-23 DIAGNOSIS — H4419 Other endophthalmitis: Secondary | ICD-10-CM | POA: Diagnosis not present

## 2021-12-27 DIAGNOSIS — I4891 Unspecified atrial fibrillation: Secondary | ICD-10-CM | POA: Diagnosis not present

## 2021-12-27 DIAGNOSIS — I9789 Other postprocedural complications and disorders of the circulatory system, not elsewhere classified: Secondary | ICD-10-CM | POA: Diagnosis not present

## 2021-12-29 DIAGNOSIS — I10 Essential (primary) hypertension: Secondary | ICD-10-CM | POA: Diagnosis not present

## 2021-12-29 DIAGNOSIS — I350 Nonrheumatic aortic (valve) stenosis: Secondary | ICD-10-CM | POA: Diagnosis not present

## 2022-01-03 ENCOUNTER — Encounter: Payer: 59 | Attending: Cardiology

## 2022-01-03 ENCOUNTER — Other Ambulatory Visit: Payer: Self-pay

## 2022-01-03 DIAGNOSIS — Z952 Presence of prosthetic heart valve: Secondary | ICD-10-CM

## 2022-01-07 DIAGNOSIS — M79674 Pain in right toe(s): Secondary | ICD-10-CM | POA: Diagnosis not present

## 2022-01-07 DIAGNOSIS — B351 Tinea unguium: Secondary | ICD-10-CM | POA: Diagnosis not present

## 2022-01-07 DIAGNOSIS — L600 Ingrowing nail: Secondary | ICD-10-CM | POA: Diagnosis not present

## 2022-01-07 DIAGNOSIS — M79675 Pain in left toe(s): Secondary | ICD-10-CM | POA: Diagnosis not present

## 2022-01-13 DIAGNOSIS — B49 Unspecified mycosis: Secondary | ICD-10-CM | POA: Diagnosis not present

## 2022-01-13 DIAGNOSIS — H4389 Other disorders of vitreous body: Secondary | ICD-10-CM | POA: Diagnosis not present

## 2022-01-13 DIAGNOSIS — H44431 Hypotony of eye due to other ocular disorders, right eye: Secondary | ICD-10-CM | POA: Diagnosis not present

## 2022-01-13 DIAGNOSIS — H5461 Unqualified visual loss, right eye, normal vision left eye: Secondary | ICD-10-CM | POA: Diagnosis not present

## 2022-01-13 DIAGNOSIS — H4419 Other endophthalmitis: Secondary | ICD-10-CM | POA: Diagnosis not present

## 2022-01-17 ENCOUNTER — Ambulatory Visit: Payer: 59

## 2022-01-20 ENCOUNTER — Ambulatory Visit: Payer: 59

## 2022-02-01 DIAGNOSIS — Z1331 Encounter for screening for depression: Secondary | ICD-10-CM | POA: Diagnosis not present

## 2022-02-01 DIAGNOSIS — H4419 Other endophthalmitis: Secondary | ICD-10-CM | POA: Diagnosis not present

## 2022-02-02 ENCOUNTER — Encounter: Payer: 59 | Attending: Cardiology

## 2022-02-02 VITALS — Ht 71.5 in | Wt 184.2 lb

## 2022-02-02 DIAGNOSIS — Z952 Presence of prosthetic heart valve: Secondary | ICD-10-CM | POA: Diagnosis not present

## 2022-02-02 NOTE — Progress Notes (Signed)
Cardiac Individual Treatment Plan  Patient Details  Name: [name] MRN: [id number] Date of Birth: 1956-01-01 Referring Provider:   Flowsheet Row Cardiac Rehab from 02/02/2022 in [facility] Cardiac and Pulmonary Rehab  Referring Provider Isaias Cowman MD       Initial Encounter Date:  Flowsheet Row Cardiac Rehab from 02/02/2022 in [facility] Cardiac and Pulmonary Rehab  Date 02/02/22       Visit Diagnosis: S/P aortic valve replacement  Patient's Home Medications on Admission:  Current Outpatient Medications:    amLODipine (NORVASC) 10 MG tablet, Take 10 mg by mouth daily., Disp: , Rfl:    aspirin EC 81 MG tablet, Take 81 mg by mouth daily. Swallow whole., Disp: , Rfl:    atorvastatin (LIPITOR) 80 MG tablet, Take 1 tablet (80 mg total) by mouth daily. (Patient not taking: Reported on 01/03/2022), Disp: , Rfl:    Multiple Vitamins-Minerals (MULTIVITAMIN WITH MINERALS) tablet, Take 1 tablet by mouth daily., Disp: , Rfl:    prednisoLONE acetate (PRED FORTE) 1 % ophthalmic suspension, Place 1 drop into the right eye 6 (six) times daily., Disp: , Rfl:   Past Medical History: Past Medical History:  Diagnosis Date   Hypertension     Tobacco Use: Social History   Tobacco Use  Smoking Status Former   Packs/day: 1.00   Years: 46.00   Total pack years: 46.00   Types: Cigarettes   Quit date: 10/09/2021   Years since quitting: 0.3  Smokeless Tobacco Never    Labs: Review Flowsheet       Latest Ref Rng & Units 10/12/2021  Labs for ITP Cardiac and Pulmonary Rehab  Cholestrol 0 - 200 mg/dL 132   LDL (calc) 0 - 99 mg/dL 90   HDL-C >40 mg/dL 17   Trlycerides <150 mg/dL 124   Hemoglobin A1c 4.8 - 5.6 % 5.7      Exercise Target Goals: Exercise Program Goal: Individual exercise prescription set using results from initial 6 min walk test and THRR while considering  patient's activity barriers and safety.   Exercise Prescription Goal: Initial exercise prescription builds to 30-45  minutes a day of aerobic activity, 2-3 days per week.  Home exercise guidelines will be given to patient during program as part of exercise prescription that the participant will acknowledge.   Education: Aerobic Exercise: - Group verbal and visual presentation on the components of exercise prescription. Introduces F.I.T.T principle from ACSM for exercise prescriptions.  Reviews F.I.T.T. principles of aerobic exercise including progression. Written material given at graduation.   Education: Resistance Exercise: - Group verbal and visual presentation on the components of exercise prescription. Introduces F.I.T.T principle from ACSM for exercise prescriptions  Reviews F.I.T.T. principles of resistance exercise including progression. Written material given at graduation.    Education: Exercise & Equipment Safety: - Individual verbal instruction and demonstration of equipment use and safety with use of the equipment. Flowsheet Row Cardiac Rehab from 01/03/2022 in [facility] Cardiac and Pulmonary Rehab  Date 01/03/22  Educator [place]  Instruction Review Code 1- Verbalizes Understanding       Education: Exercise Physiology & General Exercise Guidelines: - Group verbal and written instruction with models to review the exercise physiology of the cardiovascular system and associated critical values. Provides general exercise guidelines with specific guidelines to those with heart or lung disease.  Flowsheet Row Cardiac Rehab from 02/02/2022 in [facility] Cardiac and Pulmonary Rehab  Education need identified 02/02/22       Education: Flexibility, Balance, Mind/Body Relaxation: - Group verbal  and visual presentation with interactive activity on the components of exercise prescription. Introduces F.I.T.T principle from ACSM for exercise prescriptions. Reviews F.I.T.T. principles of flexibility and balance exercise training including progression. Also discusses the mind body connection.  Reviews various relaxation  techniques to help reduce and manage stress (i.e. Deep breathing, progressive muscle relaxation, and visualization). Balance handout provided to take home. Written material given at graduation.   Activity Barriers & Risk Stratification:  Activity Barriers & Cardiac Risk Stratification - 02/02/22 1104       Activity Barriers & Cardiac Risk Stratification   Activity Barriers Muscular Weakness    Cardiac Risk Stratification Moderate             6 Minute Walk:  6 Minute Walk     Row Name 02/02/22 1111         6 Minute Walk   Phase Initial     Distance 1410 feet     Walk Time 6 minutes     # of Rest Breaks 0     MPH 2.67     METS 3.57     RPE 9     Perceived Dyspnea  0     VO2 Peak 12.51     Symptoms No     Resting HR 62 bpm     Resting BP 126/70     Resting Oxygen Saturation  98 %     Exercise Oxygen Saturation  during 6 min walk 99 %     Max Ex. HR 92 bpm     Max Ex. BP 142/68     2 Minute Post BP 132/70              Oxygen Initial Assessment:   Oxygen Re-Evaluation:   Oxygen Discharge (Final Oxygen Re-Evaluation):   Initial Exercise Prescription:  Initial Exercise Prescription - 02/02/22 1100       Date of Initial Exercise RX and Referring Provider   Date 02/02/22    Referring Provider Paraschos, Alexander MD      Oxygen   Maintain Oxygen Saturation 88% or higher      Treadmill   MPH 2.7    Grade 0.5    Minutes 15    METs 3.25      NuStep   Level 3    SPM 80    Minutes 15    METs 3.5      REL-XR   Level 2    Speed 50    Minutes 15    METs 3.5      T5 Nustep   Level 2    SPM 80    Minutes 15    METs 3.5      Prescription Details   Frequency (times per week) 99 - 136    Duration Progress to 30 minutes of continuous aerobic without signs/symptoms of physical distress      Intensity   THRR 40-80% of Max Heartrate 99 - 136    Ratings of Perceived Exertion 11-13    Perceived Dyspnea 0-4      Progression   Progression  Continue to progress workloads to maintain intensity without signs/symptoms of physical distress.      Resistance Training   Training Prescription Yes    Weight 5 lb    Reps 10-15             Perform Capillary Blood Glucose checks as needed.  Exercise Prescription Changes:   Exercise Prescription Changes     Row  Name 02/02/22 1100             Response to Exercise   Blood Pressure (Admit) 126/70       Blood Pressure (Exercise) 142/68       Blood Pressure (Exit) 132/70       Heart Rate (Admit) 62 bpm       Heart Rate (Exercise) 92 bpm       Heart Rate (Exit) 69 bpm       Oxygen Saturation (Admit) 98 %       Oxygen Saturation (Exercise) 99 %       Oxygen Saturation (Exit) 99 %       Rating of Perceived Exertion (Exercise) 9       Perceived Dyspnea (Exercise) 0       Symptoms none       Comments walk test results                Exercise Comments:   Exercise Goals and Review:   Exercise Goals     Row Name 02/02/22 1114             Exercise Goals   Increase Physical Activity Yes       Intervention Develop an individualized exercise prescription for aerobic and resistive training based on initial evaluation findings, risk stratification, comorbidities and participant's personal goals.;Provide advice, education, support and counseling about physical activity/exercise needs.       Expected Outcomes Short Term: Attend rehab on a regular basis to increase amount of physical activity.;Long Term: Exercising regularly at least 3-5 days a week.;Long Term: Add in home exercise to make exercise part of routine and to increase amount of physical activity.       Increase Strength and Stamina Yes       Intervention Provide advice, education, support and counseling about physical activity/exercise needs.;Develop an individualized exercise prescription for aerobic and resistive training based on initial evaluation findings, risk stratification, comorbidities and  participant's personal goals.       Expected Outcomes Short Term: Increase workloads from initial exercise prescription for resistance, speed, and METs.;Short Term: Perform resistance training exercises routinely during rehab and add in resistance training at home;Long Term: Improve cardiorespiratory fitness, muscular endurance and strength as measured by increased METs and functional capacity (6MWT)       Able to understand and use rate of perceived exertion (RPE) scale Yes       Intervention Provide education and explanation on how to use RPE scale       Expected Outcomes Short Term: Able to use RPE daily in rehab to express subjective intensity level;Long Term:  Able to use RPE to guide intensity level when exercising independently       Able to understand and use Dyspnea scale Yes       Intervention Provide education and explanation on how to use Dyspnea scale       Expected Outcomes Short Term: Able to use Dyspnea scale daily in rehab to express subjective sense of shortness of breath during exertion;Long Term: Able to use Dyspnea scale to guide intensity level when exercising independently       Knowledge and understanding of Target Heart Rate Range (THRR) Yes       Intervention Provide education and explanation of THRR including how the numbers were predicted and where they are located for reference       Expected Outcomes Short Term: Able to state/look up THRR;Long Term: Able to use THRR to  govern intensity when exercising independently;Short Term: Able to use daily as guideline for intensity in rehab       Able to check pulse independently Yes       Intervention Provide education and demonstration on how to check pulse in carotid and radial arteries.;Review the importance of being able to check your own pulse for safety during independent exercise       Expected Outcomes Long Term: Able to check pulse independently and accurately;Short Term: Able to explain why pulse checking is important  during independent exercise       Understanding of Exercise Prescription Yes       Intervention Provide education, explanation, and written materials on patient's individual exercise prescription       Expected Outcomes Short Term: Able to explain program exercise prescription;Long Term: Able to explain home exercise prescription to exercise independently                Exercise Goals Re-Evaluation :   Discharge Exercise Prescription (Final Exercise Prescription Changes):  Exercise Prescription Changes - 02/02/22 1100       Response to Exercise   Blood Pressure (Admit) 126/70    Blood Pressure (Exercise) 142/68    Blood Pressure (Exit) 132/70    Heart Rate (Admit) 62 bpm    Heart Rate (Exercise) 92 bpm    Heart Rate (Exit) 69 bpm    Oxygen Saturation (Admit) 98 %    Oxygen Saturation (Exercise) 99 %    Oxygen Saturation (Exit) 99 %    Rating of Perceived Exertion (Exercise) 9    Perceived Dyspnea (Exercise) 0    Symptoms none    Comments walk test results             Nutrition:  Target Goals: Understanding of nutrition guidelines, daily intake of sodium '1500mg'$ , cholesterol '200mg'$ , calories 30% from fat and 7% or less from saturated fats, daily to have 5 or more servings of fruits and vegetables.  Education: All About Nutrition: -Group instruction provided by verbal, written material, interactive activities, discussions, models, and posters to present general guidelines for heart healthy nutrition including fat, fiber, MyPlate, the role of sodium in heart healthy nutrition, utilization of the nutrition label, and utilization of this knowledge for meal planning. Follow up email sent as well. Written material given at graduation.   Biometrics:  Pre Biometrics - 02/02/22 1106       Pre Biometrics   Height 5' 11.5" (1.816 m)    Weight 184 lb 3.2 oz (83.6 kg)   Was ~ 168 lb 1 month ago, confirmed with patient.   BMI (Calculated) 25.34    Single Leg Stand 22.4 seconds               Nutrition Therapy Plan and Nutrition Goals:  Nutrition Therapy & Goals - 02/02/22 0946       Intervention Plan   Intervention Prescribe, educate and counsel regarding individualized specific dietary modifications aiming towards targeted core components such as weight, hypertension, lipid management, diabetes, heart failure and other comorbidities.    Expected Outcomes Short Term Goal: Understand basic principles of dietary content, such as calories, fat, sodium, cholesterol and nutrients.;Short Term Goal: A plan has been developed with personal nutrition goals set during dietitian appointment.;Long Term Goal: Adherence to prescribed nutrition plan.             Nutrition Assessments:  MEDIFICTS Score Key: ?70 Need to make dietary changes  40-70 Heart Healthy Diet ? 40 Therapeutic  Level Cholesterol Diet  Flowsheet Row Cardiac Rehab from 02/02/2022 in [facility] Cardiac and Pulmonary Rehab  Picture Your Plate Total Score on Admission 51      Picture Your Plate Scores: <40 Unhealthy dietary pattern with much room for improvement. 41-50 Dietary pattern unlikely to meet recommendations for good health and room for improvement. 51-60 More healthful dietary pattern, with some room for improvement.  >60 Healthy dietary pattern, although there may be some specific behaviors that could be improved.    Nutrition Goals Re-Evaluation:   Nutrition Goals Discharge (Final Nutrition Goals Re-Evaluation):   Psychosocial: Target Goals: Acknowledge presence or absence of significant depression and/or stress, maximize coping skills, provide positive support system. Participant is able to verbalize types and ability to use techniques and skills needed for reducing stress and depression.   Education: Stress, Anxiety, and Depression - Group verbal and visual presentation to define topics covered.  Reviews how body is impacted by stress, anxiety, and depression.  Also discusses healthy  ways to reduce stress and to treat/manage anxiety and depression.  Written material given at graduation.   Education: Sleep Hygiene -Provides group verbal and written instruction about how sleep can affect your health.  Define sleep hygiene, discuss sleep cycles and impact of sleep habits. Review good sleep hygiene tips.    Initial Review & Psychosocial Screening:  Initial Psych Review & Screening - 01/03/22 0958       Initial Review   Current issues with None Identified      Family Dynamics   Good Support System? Yes    Comments He can look to his wife for support. He has not done a cardiac rehab program before. He can also look to his daughter and four grandkids for support.      Barriers   Psychosocial barriers to participate in program The patient should benefit from training in stress management and relaxation.;There are no identifiable barriers or psychosocial needs.      Screening Interventions   Interventions Encouraged to exercise;To provide support and resources with identified psychosocial needs;Provide feedback about the scores to participant    Expected Outcomes Short Term goal: Utilizing psychosocial counselor, staff and physician to assist with identification of specific Stressors or current issues interfering with healing process. Setting desired goal for each stressor or current issue identified.;Long Term Goal: Stressors or current issues are controlled or eliminated.;Short Term goal: Identification and review with participant of any Quality of Life or Depression concerns found by scoring the questionnaire.;Long Term goal: The participant improves quality of Life and PHQ9 Scores as seen by post scores and/or verbalization of changes             Quality of Life Scores:   Quality of Life - 02/02/22 0945       Quality of Life   Select Quality of Life      Quality of Life Scores   Health/Function Pre 24.4 %    Socioeconomic Pre 28.13 %    Psych/Spiritual Pre 30  %    Family Pre 30 %    GLOBAL Pre 27.17 %            Scores of 19 and below usually indicate a poorer quality of life in these areas.  A difference of  2-3 points is a clinically meaningful difference.  A difference of 2-3 points in the total score of the Quality of Life Index has been associated with significant improvement in overall quality of life, self-image, physical symptoms, and general  health in studies assessing change in quality of life.  PHQ-9: Review Flowsheet       02/02/2022  Depression screen PHQ 2/9  Decreased Interest 0  Down, Depressed, Hopeless 0  PHQ - 2 Score 0  Altered sleeping 0  Tired, decreased energy 0  Change in appetite 0  Feeling bad or failure about yourself  0  Trouble concentrating 0  Moving slowly or fidgety/restless 0  Suicidal thoughts 0  PHQ-9 Score 0  Difficult doing work/chores Not difficult at all   Interpretation of Total Score  Total Score Depression Severity:  1-4 = Minimal depression, 5-9 = Mild depression, 10-14 = Moderate depression, 15-19 = Moderately severe depression, 20-27 = Severe depression   Psychosocial Evaluation and Intervention:  Psychosocial Evaluation - 01/03/22 0959       Psychosocial Evaluation & Interventions   Interventions Encouraged to exercise with the program and follow exercise prescription;Relaxation education;Stress management education    Comments He can look to his wife for support. He has not done a cardiac rehab program before. He can also look to his daughter and four grandkids for support.    Expected Outcomes Short: Start Heart[facility] to help with mood. Long: Maintain a healthy mental state    Continue Psychosocial Services  Follow up required by staff             Psychosocial Re-Evaluation:   Psychosocial Discharge (Final Psychosocial Re-Evaluation):   Vocational Rehabilitation: Provide vocational rehab assistance to qualifying candidates.   Vocational Rehab Evaluation &  Intervention:   Education: Education Goals: Education classes will be provided on a variety of topics geared toward better understanding of heart health and risk factor modification. Participant will state understanding/return demonstration of topics presented as noted by education test scores.  Learning Barriers/Preferences:  Learning Barriers/Preferences - 01/03/22 0956       Learning Barriers/Preferences   Learning Barriers None    Learning Preferences None             General Cardiac Education Topics:  AED/CPR: - Group verbal and written instruction with the use of models to demonstrate the basic use of the AED with the basic ABC's of resuscitation.   Anatomy and Cardiac Procedures: - Group verbal and visual presentation and models provide information about basic cardiac anatomy and function. Reviews the testing methods done to diagnose heart disease and the outcomes of the test results. Describes the treatment choices: Medical Management, Angioplasty, or Coronary Bypass Surgery for treating various heart conditions including Myocardial Infarction, Angina, Valve Disease, and Cardiac Arrhythmias.  Written material given at graduation.   Medication Safety: - Group verbal and visual instruction to review commonly prescribed medications for heart and lung disease. Reviews the medication, class of the drug, and side effects. Includes the steps to properly store meds and maintain the prescription regimen.  Written material given at graduation. Flowsheet Row Cardiac Rehab from 02/02/2022 in [facility] Cardiac and Pulmonary Rehab  Education need identified 02/02/22       Intimacy: - Group verbal instruction through game format to discuss how heart and lung disease can affect sexual intimacy. Written material given at graduation..   Know Your Numbers and Heart Failure: - Group verbal and visual instruction to discuss disease risk factors for cardiac and pulmonary disease and treatment  options.  Reviews associated critical values for Overweight/Obesity, Hypertension, Cholesterol, and Diabetes.  Discusses basics of heart failure: signs/symptoms and treatments.  Introduces Heart Failure Zone chart for action plan for heart failure.  Written  material given at graduation.   Infection Prevention: - Provides verbal and written material to individual with discussion of infection control including proper hand washing and proper equipment cleaning during exercise session. Flowsheet Row Cardiac Rehab from 01/03/2022 in [facility] Cardiac and Pulmonary Rehab  Date 01/03/22  Educator [facility]  Instruction Review Code 1- Verbalizes Understanding       Falls Prevention: - Provides verbal and written material to individual with discussion of falls prevention and safety. Flowsheet Row Cardiac Rehab from 01/03/2022 in [facility] Cardiac and Pulmonary Rehab  Date 01/03/22  Educator [facility]  Instruction Review Code 1- Verbalizes Understanding       Other: -Provides group and verbal instruction on various topics (see comments)   Knowledge Questionnaire Score:  Knowledge Questionnaire Score - 02/02/22 0945       Knowledge Questionnaire Score   Pre Score 24/26             Core Components/Risk Factors/Patient Goals at Admission:  Personal Goals and Risk Factors at Admission - 02/02/22 1115       Core Components/Risk Factors/Patient Goals on Admission    Weight Management Yes;Weight Maintenance   Used to be 200lb, lost weight to ~168, gained up to 184 lb the last months   Intervention Weight Management: Develop a combined nutrition and exercise program designed to reach desired caloric intake, while maintaining appropriate intake of nutrient and fiber, sodium and fats, and appropriate energy expenditure required for the weight goal.;Weight Management: Provide education and appropriate resources to help participant work on and attain dietary goals.;Weight Management/Obesity: Establish reasonable short  term and long term weight goals.    Admit Weight 184 lb (83.5 kg)    Goal Weight: Short Term 184 lb (83.5 kg)    Goal Weight: Long Term 184 lb (83.5 kg)    Expected Outcomes Short Term: Continue to assess and modify interventions until short term weight is achieved;Long Term: Adherence to nutrition and physical activity/exercise program aimed toward attainment of established weight goal;Weight Maintenance: Understanding of the daily nutrition guidelines, which includes 25-35% calories from fat, 7% or less cal from saturated fats, less than '200mg'$  cholesterol, less than 1.5gm of sodium, & 5 or more servings of fruits and vegetables daily;Understanding recommendations for meals to include 15-35% energy as protein, 25-35% energy from fat, 35-60% energy from carbohydrates, less than '200mg'$  of dietary cholesterol, 20-35 gm of total fiber daily;Understanding of distribution of calorie intake throughout the day with the consumption of 4-5 meals/snacks    Tobacco Cessation Yes    Number of packs per day 0. Quit on 10/10/21    Intervention Assist the participant in steps to quit. Provide individualized education and counseling about committing to Tobacco Cessation, relapse prevention, and pharmacological support that can be provided by physician.;Advice worker, assist with locating and accessing local/national Quit Smoking programs, and support quit date choice.    Expected Outcomes Short Term: Will demonstrate readiness to quit, by selecting a quit date.;Short Term: Will quit all tobacco product use, adhering to prevention of relapse plan.;Long Term: Complete abstinence from all tobacco products for at least 12 months from quit date.    Hypertension Yes    Intervention Provide education on lifestyle modifcations including regular physical activity/exercise, weight management, moderate sodium restriction and increased consumption of fresh fruit, vegetables, and low fat dairy, alcohol moderation, and  smoking cessation.;Monitor prescription use compliance.    Expected Outcomes Short Term: Continued assessment and intervention until BP is < 140/51m HG in hypertensive participants. <  130/7m HG in hypertensive participants with diabetes, heart failure or chronic kidney disease.;Long Term: Maintenance of blood pressure at goal levels.    Lipids Yes    Intervention Provide education and support for participant on nutrition & aerobic/resistive exercise along with prescribed medications to achieve LDL '70mg'$ , HDL >'40mg'$ .    Expected Outcomes Short Term: Participant states understanding of desired cholesterol values and is compliant with medications prescribed. Participant is following exercise prescription and nutrition guidelines.;Long Term: Cholesterol controlled with medications as prescribed, with individualized exercise RX and with personalized nutrition plan. Value goals: LDL < '70mg'$ , HDL > 40 mg.             Education:Diabetes - Individual verbal and written instruction to review signs/symptoms of diabetes, desired ranges of glucose level fasting, after meals and with exercise. Acknowledge that pre and post exercise glucose checks will be done for 3 sessions at entry of program.   Core Components/Risk Factors/Patient Goals Review:    Core Components/Risk Factors/Patient Goals at Discharge (Final Review):    ITP Comments:  ITP Comments     Row Name 01/03/22 0955 02/02/22 0943         ITP Comments Virtual Visit completed. Patient informed on EP and RD appointment and 6 Minute walk test. Patient also informed of patient health questionnaires on My Chart. Patient Verbalizes understanding. [facility] Completed 6MWT and gym orientation. Initial ITP created and sent for review to Dr. MAvelina Laine Medical Director.               Comments: Initial ITP

## 2022-02-02 NOTE — Patient Instructions (Signed)
Patient Instructions  Patient Details  Name: [name] MRN: [id number] Date of Birth: 05-05-1956 Referring Provider:  Isaias Cowman, MD  Below are your personal goals for exercise, nutrition, and risk factors. Our goal is to help you stay on track towards obtaining and maintaining these goals. We will be discussing your progress on these goals with you throughout the program.  Initial Exercise Prescription:  Initial Exercise Prescription - 02/02/22 1100       Date of Initial Exercise RX and Referring Provider   Date 02/02/22    Referring Provider Paraschos, Alexander MD      Oxygen   Maintain Oxygen Saturation 88% or higher      Treadmill   MPH 2.7    Grade 0.5    Minutes 15    METs 3.25      NuStep   Level 3    SPM 80    Minutes 15    METs 3.5      REL-XR   Level 2    Speed 50    Minutes 15    METs 3.5      T5 Nustep   Level 2    SPM 80    Minutes 15    METs 3.5      Prescription Details   Frequency (times per week) 99 - 136    Duration Progress to 30 minutes of continuous aerobic without signs/symptoms of physical distress      Intensity   THRR 40-80% of Max Heartrate 99 - 136    Ratings of Perceived Exertion 11-13    Perceived Dyspnea 0-4      Progression   Progression Continue to progress workloads to maintain intensity without signs/symptoms of physical distress.      Resistance Training   Training Prescription Yes    Weight 5 lb    Reps 10-15             Exercise Goals: Frequency: Be able to perform aerobic exercise two to three times per week in program working toward 2-5 days per week of home exercise.  Intensity: Work with a perceived exertion of 11 (fairly light) - 15 (hard) while following your exercise prescription.  We will make changes to your prescription with you as you progress through the program.   Duration: Be able to do 30 to 45 minutes of continuous aerobic exercise in addition to a 5 minute warm-up and a 5  minute cool-down routine.   Nutrition Goals: Your personal nutrition goals will be established when you do your nutrition analysis with the dietician.  The following are general nutrition guidelines to follow: Cholesterol < '200mg'$ /day Sodium < '1500mg'$ /day Fiber: Men over 50 yrs - 30 grams per day  Personal Goals:  Personal Goals and Risk Factors at Admission - 02/02/22 1115       Core Components/Risk Factors/Patient Goals on Admission    Weight Management Yes;Weight Maintenance   Used to be 200lb, lost weight to ~168, gained up to 184 lb the last months   Intervention Weight Management: Develop a combined nutrition and exercise program designed to reach desired caloric intake, while maintaining appropriate intake of nutrient and fiber, sodium and fats, and appropriate energy expenditure required for the weight goal.;Weight Management: Provide education and appropriate resources to help participant work on and attain dietary goals.;Weight Management/Obesity: Establish reasonable short term and long term weight goals.    Admit Weight 184 lb (83.5 kg)    Goal Weight: Short Term 184 lb (83.5  kg)    Goal Weight: Long Term 184 lb (83.5 kg)    Expected Outcomes Short Term: Continue to assess and modify interventions until short term weight is achieved;Long Term: Adherence to nutrition and physical activity/exercise program aimed toward attainment of established weight goal;Weight Maintenance: Understanding of the daily nutrition guidelines, which includes 25-35% calories from fat, 7% or less cal from saturated fats, less than '200mg'$  cholesterol, less than 1.5gm of sodium, & 5 or more servings of fruits and vegetables daily;Understanding recommendations for meals to include 15-35% energy as protein, 25-35% energy from fat, 35-60% energy from carbohydrates, less than '200mg'$  of dietary cholesterol, 20-35 gm of total fiber daily;Understanding of distribution of calorie intake throughout the day with the  consumption of 4-5 meals/snacks    Tobacco Cessation Yes    Number of packs per day 0. Quit on 10/10/21    Intervention Assist the participant in steps to quit. Provide individualized education and counseling about committing to Tobacco Cessation, relapse prevention, and pharmacological support that can be provided by physician.;Advice worker, assist with locating and accessing local/national Quit Smoking programs, and support quit date choice.    Expected Outcomes Short Term: Will demonstrate readiness to quit, by selecting a quit date.;Short Term: Will quit all tobacco product use, adhering to prevention of relapse plan.;Long Term: Complete abstinence from all tobacco products for at least 12 months from quit date.    Hypertension Yes    Intervention Provide education on lifestyle modifcations including regular physical activity/exercise, weight management, moderate sodium restriction and increased consumption of fresh fruit, vegetables, and low fat dairy, alcohol moderation, and smoking cessation.;Monitor prescription use compliance.    Expected Outcomes Short Term: Continued assessment and intervention until BP is < 140/70m HG in hypertensive participants. < 130/823mHG in hypertensive participants with diabetes, heart failure or chronic kidney disease.;Long Term: Maintenance of blood pressure at goal levels.    Lipids Yes    Intervention Provide education and support for participant on nutrition & aerobic/resistive exercise along with prescribed medications to achieve LDL '70mg'$ , HDL >'40mg'$ .    Expected Outcomes Short Term: Participant states understanding of desired cholesterol values and is compliant with medications prescribed. Participant is following exercise prescription and nutrition guidelines.;Long Term: Cholesterol controlled with medications as prescribed, with individualized exercise RX and with personalized nutrition plan. Value goals: LDL < '70mg'$ , HDL > 40 mg.              Tobacco Use Initial Evaluation: Social History   Tobacco Use  Smoking Status Former   Packs/day: 1.00   Years: 46.00   Total pack years: 46.00   Types: Cigarettes   Quit date: 10/09/2021   Years since quitting: 0.3  Smokeless Tobacco Never    Exercise Goals and Review:  Exercise Goals     Row Name 02/02/22 1114             Exercise Goals   Increase Physical Activity Yes       Intervention Develop an individualized exercise prescription for aerobic and resistive training based on initial evaluation findings, risk stratification, comorbidities and participant's personal goals.;Provide advice, education, support and counseling about physical activity/exercise needs.       Expected Outcomes Short Term: Attend rehab on a regular basis to increase amount of physical activity.;Long Term: Exercising regularly at least 3-5 days a week.;Long Term: Add in home exercise to make exercise part of routine and to increase amount of physical activity.       Increase Strength  and Stamina Yes       Intervention Provide advice, education, support and counseling about physical activity/exercise needs.;Develop an individualized exercise prescription for aerobic and resistive training based on initial evaluation findings, risk stratification, comorbidities and participant's personal goals.       Expected Outcomes Short Term: Increase workloads from initial exercise prescription for resistance, speed, and METs.;Short Term: Perform resistance training exercises routinely during rehab and add in resistance training at home;Long Term: Improve cardiorespiratory fitness, muscular endurance and strength as measured by increased METs and functional capacity (6MWT)       Able to understand and use rate of perceived exertion (RPE) scale Yes       Intervention Provide education and explanation on how to use RPE scale       Expected Outcomes Short Term: Able to use RPE daily in rehab to express subjective intensity  level;Long Term:  Able to use RPE to guide intensity level when exercising independently       Able to understand and use Dyspnea scale Yes       Intervention Provide education and explanation on how to use Dyspnea scale       Expected Outcomes Short Term: Able to use Dyspnea scale daily in rehab to express subjective sense of shortness of breath during exertion;Long Term: Able to use Dyspnea scale to guide intensity level when exercising independently       Knowledge and understanding of Target Heart Rate Range (THRR) Yes       Intervention Provide education and explanation of THRR including how the numbers were predicted and where they are located for reference       Expected Outcomes Short Term: Able to state/look up THRR;Long Term: Able to use THRR to govern intensity when exercising independently;Short Term: Able to use daily as guideline for intensity in rehab       Able to check pulse independently Yes       Intervention Provide education and demonstration on how to check pulse in carotid and radial arteries.;Review the importance of being able to check your own pulse for safety during independent exercise       Expected Outcomes Long Term: Able to check pulse independently and accurately;Short Term: Able to explain why pulse checking is important during independent exercise       Understanding of Exercise Prescription Yes       Intervention Provide education, explanation, and written materials on patient's individual exercise prescription       Expected Outcomes Short Term: Able to explain program exercise prescription;Long Term: Able to explain home exercise prescription to exercise independently                Copy of goals given to participant.

## 2022-02-07 DIAGNOSIS — B351 Tinea unguium: Secondary | ICD-10-CM | POA: Diagnosis not present

## 2022-02-07 DIAGNOSIS — I10 Essential (primary) hypertension: Secondary | ICD-10-CM | POA: Diagnosis not present

## 2022-02-07 DIAGNOSIS — I330 Acute and subacute infective endocarditis: Secondary | ICD-10-CM | POA: Diagnosis not present

## 2022-02-07 DIAGNOSIS — Z95828 Presence of other vascular implants and grafts: Secondary | ICD-10-CM | POA: Diagnosis not present

## 2022-02-07 DIAGNOSIS — R200 Anesthesia of skin: Secondary | ICD-10-CM | POA: Diagnosis not present

## 2022-02-10 DIAGNOSIS — B49 Unspecified mycosis: Secondary | ICD-10-CM | POA: Diagnosis not present

## 2022-02-10 DIAGNOSIS — H44431 Hypotony of eye due to other ocular disorders, right eye: Secondary | ICD-10-CM | POA: Diagnosis not present

## 2022-02-10 DIAGNOSIS — H4419 Other endophthalmitis: Secondary | ICD-10-CM | POA: Diagnosis not present

## 2022-02-10 DIAGNOSIS — H4389 Other disorders of vitreous body: Secondary | ICD-10-CM | POA: Diagnosis not present

## 2022-02-14 ENCOUNTER — Encounter: Payer: PPO | Attending: Cardiology | Admitting: *Deleted

## 2022-02-14 DIAGNOSIS — E785 Hyperlipidemia, unspecified: Secondary | ICD-10-CM | POA: Diagnosis not present

## 2022-02-14 DIAGNOSIS — I4891 Unspecified atrial fibrillation: Secondary | ICD-10-CM | POA: Diagnosis not present

## 2022-02-14 DIAGNOSIS — I10 Essential (primary) hypertension: Secondary | ICD-10-CM | POA: Diagnosis not present

## 2022-02-14 DIAGNOSIS — R42 Dizziness and giddiness: Secondary | ICD-10-CM | POA: Diagnosis not present

## 2022-02-14 DIAGNOSIS — Z952 Presence of prosthetic heart valve: Secondary | ICD-10-CM | POA: Diagnosis not present

## 2022-02-14 DIAGNOSIS — E782 Mixed hyperlipidemia: Secondary | ICD-10-CM | POA: Diagnosis not present

## 2022-02-14 DIAGNOSIS — R7303 Prediabetes: Secondary | ICD-10-CM | POA: Diagnosis not present

## 2022-02-14 DIAGNOSIS — J449 Chronic obstructive pulmonary disease, unspecified: Secondary | ICD-10-CM | POA: Diagnosis not present

## 2022-02-14 DIAGNOSIS — I9789 Other postprocedural complications and disorders of the circulatory system, not elsewhere classified: Secondary | ICD-10-CM | POA: Diagnosis not present

## 2022-02-14 NOTE — Progress Notes (Signed)
Daily Session Note  Patient Details  Name: [name] MRN: [id number] Date of Birth: [birth year] Referring Provider:   Flowsheet Row Cardiac Rehab from 02/02/2022 in [facility] Cardiac and Pulmonary Rehab  Referring Provider Isaias Cowman MD       Encounter Date: 02/14/2022  Check In:  Session Check In - 02/14/22 0835       Check-In   Supervising physician immediately available to respond to emergencies See telemetry face sheet for immediately available ER MD    Location [facility]-Cardiac & Pulmonary Rehab    Staff Present Heath Lark, RN, BSN, Laveda Norman, BS, ACSM CEP, Exercise Physiologist;Kara Eliezer Bottom, MS, ASCM CEP, Exercise Physiologist    Virtual Visit No    Medication changes reported     No    Fall or balance concerns reported    No    Tobacco Cessation No Change    Current number of cigarettes/nicotine per day     0    Warm-up and Cool-down Performed on first and last piece of equipment    Resistance Training Performed Yes    VAD Patient? No    PAD/SET Patient? No      Pain Assessment   Currently in Pain? No/denies                Social History   Tobacco Use  Smoking Status Former   Packs/day: 1.00   Years: 46.00   Total pack years: 46.00   Types: Cigarettes   Quit date: 10/09/2021   Years since quitting: 0.3  Smokeless Tobacco Never    Goals Met:  Exercise tolerated well Personal goals reviewed No report of concerns or symptoms today  Goals Unmet:  Not Applicable  Comments: First full day of exercise!  Patient was oriented to gym and equipment including functions, settings, policies, and procedures.  Patient's individual exercise prescription and treatment plan were reviewed.  All starting workloads were established based on the results of the 6 minute walk test done at initial orientation visit.  The plan for exercise progression was also introduced and progression will be customized based on patient's performance and goals.    Dr. Emily Filbert is Medical Director for [facility].  Dr. Ottie Glazier is Medical Director for [facility] Pulmonary Rehabilitation.

## 2022-02-16 ENCOUNTER — Encounter: Payer: Self-pay | Admitting: *Deleted

## 2022-02-16 ENCOUNTER — Encounter: Payer: PPO | Admitting: *Deleted

## 2022-02-16 DIAGNOSIS — Z952 Presence of prosthetic heart valve: Secondary | ICD-10-CM

## 2022-02-16 NOTE — Progress Notes (Signed)
Daily Session Note  Patient Details  Name: [name] MRN: [id number] Date of Birth: Jan 21, 1956 Referring Provider:   Flowsheet Row Cardiac Rehab from 02/02/2022 in [facility] Cardiac and Pulmonary Rehab  Referring Provider Isaias Cowman MD       Encounter Date: 02/16/2022  Check In:  Session Check In - 02/16/22 1547       Check-In   Supervising physician immediately available to respond to emergencies See telemetry face sheet for immediately available ER MD    Location [facility]-Cardiac & Pulmonary Rehab    Staff Present Nyoka Cowden, RN, BSN, Ardeth Sportsman, RDN, LDN;Jessica Hamel, MA, RCEP, CCRP, [place], MS, ASCM CEP, Exercise Physiologist    Virtual Visit No    Medication changes reported     No    Fall or balance concerns reported    No    Tobacco Cessation No Change    Warm-up and Cool-down Performed on first and last piece of equipment    Resistance Training Performed Yes    VAD Patient? No    PAD/SET Patient? No      Pain Assessment   Currently in Pain? No/denies                Social History   Tobacco Use  Smoking Status Former   Packs/day: 1.00   Years: 46.00   Total pack years: 46.00   Types: Cigarettes   Quit date: 10/09/2021   Years since quitting: 0.3  Smokeless Tobacco Never    Goals Met:  Independence with exercise equipment Exercise tolerated well No report of concerns or symptoms today  Goals Unmet:  Not Applicable  Comments: Pt able to follow exercise prescription today without complaint.  Will continue to monitor for progression.    Dr. Emily Filbert is Medical Director for [facility].  Dr. Ottie Glazier is Medical Director for [facility] Pulmonary Rehabilitation.

## 2022-02-16 NOTE — Progress Notes (Signed)
Cardiac Individual Treatment Plan  Patient Details  Name: [name] MRN: [id number] Date of Birth: Mar 15, 1956 Referring Provider:   Flowsheet Row Cardiac Rehab from 02/02/2022 in [facility] Cardiac and Pulmonary Rehab  Referring Provider Isaias Cowman MD       Initial Encounter Date:  Flowsheet Row Cardiac Rehab from 02/02/2022 in [facility] Cardiac and Pulmonary Rehab  Date 02/02/22       Visit Diagnosis: S/P aortic valve replacement  Patient's Home Medications on Admission:  Current Outpatient Medications:    amLODipine (NORVASC) 10 MG tablet, Take 10 mg by mouth daily., Disp: , Rfl:    aspirin EC 81 MG tablet, Take 81 mg by mouth daily. Swallow whole., Disp: , Rfl:    atorvastatin (LIPITOR) 80 MG tablet, Take 1 tablet (80 mg total) by mouth daily. (Patient not taking: Reported on 01/03/2022), Disp: , Rfl:    Multiple Vitamins-Minerals (MULTIVITAMIN WITH MINERALS) tablet, Take 1 tablet by mouth daily., Disp: , Rfl:    prednisoLONE acetate (PRED FORTE) 1 % ophthalmic suspension, Place 1 drop into the right eye 6 (six) times daily., Disp: , Rfl:   Past Medical History: Past Medical History:  Diagnosis Date   Hypertension     Tobacco Use: Social History   Tobacco Use  Smoking Status Former   Packs/day: 1.00   Years: 46.00   Total pack years: 46.00   Types: Cigarettes   Quit date: 10/09/2021   Years since quitting: 0.3  Smokeless Tobacco Never    Labs: Review Flowsheet       Latest Ref Rng & Units 10/12/2021  Labs for ITP Cardiac and Pulmonary Rehab  Cholestrol 0 - 200 mg/dL 132   LDL (calc) 0 - 99 mg/dL 90   HDL-C >40 mg/dL 17   Trlycerides <150 mg/dL 124   Hemoglobin A1c 4.8 - 5.6 % 5.7      Exercise Target Goals: Exercise Program Goal: Individual exercise prescription set using results from initial 6 min walk test and THRR while considering  patient's activity barriers and safety.   Exercise Prescription Goal: Initial exercise prescription builds to 30-45  minutes a day of aerobic activity, 2-3 days per week.  Home exercise guidelines will be given to patient during program as part of exercise prescription that the participant will acknowledge.   Education: Aerobic Exercise: - Group verbal and visual presentation on the components of exercise prescription. Introduces F.I.T.T principle from ACSM for exercise prescriptions.  Reviews F.I.T.T. principles of aerobic exercise including progression. Written material given at graduation.   Education: Resistance Exercise: - Group verbal and visual presentation on the components of exercise prescription. Introduces F.I.T.T principle from ACSM for exercise prescriptions  Reviews F.I.T.T. principles of resistance exercise including progression. Written material given at graduation.    Education: Exercise & Equipment Safety: - Individual verbal instruction and demonstration of equipment use and safety with use of the equipment. Flowsheet Row Cardiac Rehab from 01/03/2022 in [facility] Cardiac and Pulmonary Rehab  Date 01/03/22  Educator [place]  Instruction Review Code 1- Verbalizes Understanding       Education: Exercise Physiology & General Exercise Guidelines: - Group verbal and written instruction with models to review the exercise physiology of the cardiovascular system and associated critical values. Provides general exercise guidelines with specific guidelines to those with heart or lung disease.  Flowsheet Row Cardiac Rehab from 02/02/2022 in [facility] Cardiac and Pulmonary Rehab  Education need identified 02/02/22       Education: Flexibility, Balance, Mind/Body Relaxation: - Group verbal  and visual presentation with interactive activity on the components of exercise prescription. Introduces F.I.T.T principle from ACSM for exercise prescriptions. Reviews F.I.T.T. principles of flexibility and balance exercise training including progression. Also discusses the mind body connection.  Reviews various relaxation  techniques to help reduce and manage stress (i.e. Deep breathing, progressive muscle relaxation, and visualization). Balance handout provided to take home. Written material given at graduation.   Activity Barriers & Risk Stratification:  Activity Barriers & Cardiac Risk Stratification - 02/02/22 1104       Activity Barriers & Cardiac Risk Stratification   Activity Barriers Muscular Weakness    Cardiac Risk Stratification Moderate             6 Minute Walk:  6 Minute Walk     Row Name 02/02/22 1111         6 Minute Walk   Phase Initial     Distance 1410 feet     Walk Time 6 minutes     # of Rest Breaks 0     MPH 2.67     METS 3.57     RPE 9     Perceived Dyspnea  0     VO2 Peak 12.51     Symptoms No     Resting HR 62 bpm     Resting BP 126/70     Resting Oxygen Saturation  98 %     Exercise Oxygen Saturation  during 6 min walk 99 %     Max Ex. HR 92 bpm     Max Ex. BP 142/68     2 Minute Post BP 132/70              Oxygen Initial Assessment:   Oxygen Re-Evaluation:   Oxygen Discharge (Final Oxygen Re-Evaluation):   Initial Exercise Prescription:  Initial Exercise Prescription - 02/02/22 1100       Date of Initial Exercise RX and Referring Provider   Date 02/02/22    Referring Provider Paraschos, Alexander MD      Oxygen   Maintain Oxygen Saturation 88% or higher      Treadmill   MPH 2.7    Grade 0.5    Minutes 15    METs 3.25      NuStep   Level 3    SPM 80    Minutes 15    METs 3.5      REL-XR   Level 2    Speed 50    Minutes 15    METs 3.5      T5 Nustep   Level 2    SPM 80    Minutes 15    METs 3.5      Prescription Details   Frequency (times per week) 99 - 136    Duration Progress to 30 minutes of continuous aerobic without signs/symptoms of physical distress      Intensity   THRR 40-80% of Max Heartrate 99 - 136    Ratings of Perceived Exertion 11-13    Perceived Dyspnea 0-4      Progression   Progression  Continue to progress workloads to maintain intensity without signs/symptoms of physical distress.      Resistance Training   Training Prescription Yes    Weight 5 lb    Reps 10-15             Perform Capillary Blood Glucose checks as needed.  Exercise Prescription Changes:   Exercise Prescription Changes     Row  Name 02/02/22 1100             Response to Exercise   Blood Pressure (Admit) 126/70       Blood Pressure (Exercise) 142/68       Blood Pressure (Exit) 132/70       Heart Rate (Admit) 62 bpm       Heart Rate (Exercise) 92 bpm       Heart Rate (Exit) 69 bpm       Oxygen Saturation (Admit) 98 %       Oxygen Saturation (Exercise) 99 %       Oxygen Saturation (Exit) 99 %       Rating of Perceived Exertion (Exercise) 9       Perceived Dyspnea (Exercise) 0       Symptoms none       Comments walk test results                Exercise Comments:   Exercise Comments     Row Name 02/14/22 0836           Exercise Comments First full day of exercise!  Patient was oriented to gym and equipment including functions, settings, policies, and procedures.  Patient's individual exercise prescription and treatment plan were reviewed.  All starting workloads were established based on the results of the 6 minute walk test done at initial orientation visit.  The plan for exercise progression was also introduced and progression will be customized based on patient's performance and goals.                Exercise Goals and Review:   Exercise Goals     Row Name 02/02/22 1114             Exercise Goals   Increase Physical Activity Yes       Intervention Develop an individualized exercise prescription for aerobic and resistive training based on initial evaluation findings, risk stratification, comorbidities and participant's personal goals.;Provide advice, education, support and counseling about physical activity/exercise needs.       Expected Outcomes Short Term:  Attend rehab on a regular basis to increase amount of physical activity.;Long Term: Exercising regularly at least 3-5 days a week.;Long Term: Add in home exercise to make exercise part of routine and to increase amount of physical activity.       Increase Strength and Stamina Yes       Intervention Provide advice, education, support and counseling about physical activity/exercise needs.;Develop an individualized exercise prescription for aerobic and resistive training based on initial evaluation findings, risk stratification, comorbidities and participant's personal goals.       Expected Outcomes Short Term: Increase workloads from initial exercise prescription for resistance, speed, and METs.;Short Term: Perform resistance training exercises routinely during rehab and add in resistance training at home;Long Term: Improve cardiorespiratory fitness, muscular endurance and strength as measured by increased METs and functional capacity (6MWT)       Able to understand and use rate of perceived exertion (RPE) scale Yes       Intervention Provide education and explanation on how to use RPE scale       Expected Outcomes Short Term: Able to use RPE daily in rehab to express subjective intensity level;Long Term:  Able to use RPE to guide intensity level when exercising independently       Able to understand and use Dyspnea scale Yes       Intervention Provide education and explanation on how to  use Dyspnea scale       Expected Outcomes Short Term: Able to use Dyspnea scale daily in rehab to express subjective sense of shortness of breath during exertion;Long Term: Able to use Dyspnea scale to guide intensity level when exercising independently       Knowledge and understanding of Target Heart Rate Range (THRR) Yes       Intervention Provide education and explanation of THRR including how the numbers were predicted and where they are located for reference       Expected Outcomes Short Term: Able to state/look up  THRR;Long Term: Able to use THRR to govern intensity when exercising independently;Short Term: Able to use daily as guideline for intensity in rehab       Able to check pulse independently Yes       Intervention Provide education and demonstration on how to check pulse in carotid and radial arteries.;Review the importance of being able to check your own pulse for safety during independent exercise       Expected Outcomes Long Term: Able to check pulse independently and accurately;Short Term: Able to explain why pulse checking is important during independent exercise       Understanding of Exercise Prescription Yes       Intervention Provide education, explanation, and written materials on patient's individual exercise prescription       Expected Outcomes Short Term: Able to explain program exercise prescription;Long Term: Able to explain home exercise prescription to exercise independently                Exercise Goals Re-Evaluation :  Exercise Goals Re-Evaluation     Row Name 02/14/22 0836             Exercise Goal Re-Evaluation   Exercise Goals Review Able to understand and use rate of perceived exertion (RPE) scale;Knowledge and understanding of Target Heart Rate Range (THRR);Understanding of Exercise Prescription;Able to understand and use Dyspnea scale       Comments Reviewed RPE and dyspnea scales, THR and program prescription with pt today.  Pt voiced understanding and was given a copy of goals to take home.       Expected Outcomes Short: Use RPE daily to regulate intensity. Long: Follow program prescription in THR.                Discharge Exercise Prescription (Final Exercise Prescription Changes):  Exercise Prescription Changes - 02/02/22 1100       Response to Exercise   Blood Pressure (Admit) 126/70    Blood Pressure (Exercise) 142/68    Blood Pressure (Exit) 132/70    Heart Rate (Admit) 62 bpm    Heart Rate (Exercise) 92 bpm    Heart Rate (Exit) 69 bpm     Oxygen Saturation (Admit) 98 %    Oxygen Saturation (Exercise) 99 %    Oxygen Saturation (Exit) 99 %    Rating of Perceived Exertion (Exercise) 9    Perceived Dyspnea (Exercise) 0    Symptoms none    Comments walk test results             Nutrition:  Target Goals: Understanding of nutrition guidelines, daily intake of sodium '1500mg'$ , cholesterol '200mg'$ , calories 30% from fat and 7% or less from saturated fats, daily to have 5 or more servings of fruits and vegetables.  Education: All About Nutrition: -Group instruction provided by verbal, written material, interactive activities, discussions, models, and posters to present general guidelines for heart healthy nutrition including  fat, fiber, MyPlate, the role of sodium in heart healthy nutrition, utilization of the nutrition label, and utilization of this knowledge for meal planning. Follow up email sent as well. Written material given at graduation.   Biometrics:  Pre Biometrics - 02/02/22 1106       Pre Biometrics   Height 5' 11.5" (1.816 m)    Weight 184 lb 3.2 oz (83.6 kg)   Was ~ 168 lb 1 month ago, confirmed with patient.   BMI (Calculated) 25.34    Single Leg Stand 22.4 seconds              Nutrition Therapy Plan and Nutrition Goals:  Nutrition Therapy & Goals - 02/02/22 0946       Personal Nutrition Goals   Comments Patient states he used to be 200 lb, lost weight to ~160 lb, now is at 184 lb. Gained about 16 lbs since 1 month ago.      Intervention Plan   Intervention Prescribe, educate and counsel regarding individualized specific dietary modifications aiming towards targeted core components such as weight, hypertension, lipid management, diabetes, heart failure and other comorbidities.    Expected Outcomes Short Term Goal: Understand basic principles of dietary content, such as calories, fat, sodium, cholesterol and nutrients.;Short Term Goal: A plan has been developed with personal nutrition goals set during  dietitian appointment.;Long Term Goal: Adherence to prescribed nutrition plan.             Nutrition Assessments:  MEDIFICTS Score Key: ?70 Need to make dietary changes  40-70 Heart Healthy Diet ? 40 Therapeutic Level Cholesterol Diet  Flowsheet Row Cardiac Rehab from 02/02/2022 in [facility] Cardiac and Pulmonary Rehab  Picture Your Plate Total Score on Admission 51      Picture Your Plate Scores: <50 Unhealthy dietary pattern with much room for improvement. 41-50 Dietary pattern unlikely to meet recommendations for good health and room for improvement. 51-60 More healthful dietary pattern, with some room for improvement.  >60 Healthy dietary pattern, although there may be some specific behaviors that could be improved.    Nutrition Goals Re-Evaluation:   Nutrition Goals Discharge (Final Nutrition Goals Re-Evaluation):   Psychosocial: Target Goals: Acknowledge presence or absence of significant depression and/or stress, maximize coping skills, provide positive support system. Participant is able to verbalize types and ability to use techniques and skills needed for reducing stress and depression.   Education: Stress, Anxiety, and Depression - Group verbal and visual presentation to define topics covered.  Reviews how body is impacted by stress, anxiety, and depression.  Also discusses healthy ways to reduce stress and to treat/manage anxiety and depression.  Written material given at graduation.   Education: Sleep Hygiene -Provides group verbal and written instruction about how sleep can affect your health.  Define sleep hygiene, discuss sleep cycles and impact of sleep habits. Review good sleep hygiene tips.    Initial Review & Psychosocial Screening:  Initial Psych Review & Screening - 01/03/22 0958       Initial Review   Current issues with None Identified      Family Dynamics   Good Support System? Yes    Comments He can look to his wife for support. He has not done  a cardiac rehab program before. He can also look to his daughter and four grandkids for support.      Barriers   Psychosocial barriers to participate in program The patient should benefit from training in stress management and relaxation.;There are no identifiable  barriers or psychosocial needs.      Screening Interventions   Interventions Encouraged to exercise;To provide support and resources with identified psychosocial needs;Provide feedback about the scores to participant    Expected Outcomes Short Term goal: Utilizing psychosocial counselor, staff and physician to assist with identification of specific Stressors or current issues interfering with healing process. Setting desired goal for each stressor or current issue identified.;Long Term Goal: Stressors or current issues are controlled or eliminated.;Short Term goal: Identification and review with participant of any Quality of Life or Depression concerns found by scoring the questionnaire.;Long Term goal: The participant improves quality of Life and PHQ9 Scores as seen by post scores and/or verbalization of changes             Quality of Life Scores:   Quality of Life - 02/02/22 0945       Quality of Life   Select Quality of Life      Quality of Life Scores   Health/Function Pre 24.4 %    Socioeconomic Pre 28.13 %    Psych/Spiritual Pre 30 %    Family Pre 30 %    GLOBAL Pre 27.17 %            Scores of 19 and below usually indicate a poorer quality of life in these areas.  A difference of  2-3 points is a clinically meaningful difference.  A difference of 2-3 points in the total score of the Quality of Life Index has been associated with significant improvement in overall quality of life, self-image, physical symptoms, and general health in studies assessing change in quality of life.  PHQ-9: Review Flowsheet       02/02/2022  Depression screen PHQ 2/9  Decreased Interest 0  Down, Depressed, Hopeless 0  PHQ - 2  Score 0  Altered sleeping 0  Tired, decreased energy 0  Change in appetite 0  Feeling bad or failure about yourself  0  Trouble concentrating 0  Moving slowly or fidgety/restless 0  Suicidal thoughts 0  PHQ-9 Score 0  Difficult doing work/chores Not difficult at all   Interpretation of Total Score  Total Score Depression Severity:  1-4 = Minimal depression, 5-9 = Mild depression, 10-14 = Moderate depression, 15-19 = Moderately severe depression, 20-27 = Severe depression   Psychosocial Evaluation and Intervention:  Psychosocial Evaluation - 01/03/22 0959       Psychosocial Evaluation & Interventions   Interventions Encouraged to exercise with the program and follow exercise prescription;Relaxation education;Stress management education    Comments He can look to his wife for support. He has not done a cardiac rehab program before. He can also look to his daughter and four grandkids for support.    Expected Outcomes Short: Start [facility] to help with mood. Long: Maintain a healthy mental state    Continue Psychosocial Services  Follow up required by staff             Psychosocial Re-Evaluation:   Psychosocial Discharge (Final Psychosocial Re-Evaluation):   Vocational Rehabilitation: Provide vocational rehab assistance to qualifying candidates.   Vocational Rehab Evaluation & Intervention:   Education: Education Goals: Education classes will be provided on a variety of topics geared toward better understanding of heart health and risk factor modification. Participant will state understanding/return demonstration of topics presented as noted by education test scores.  Learning Barriers/Preferences:  Learning Barriers/Preferences - 01/03/22 3419       Learning Barriers/Preferences   Learning Barriers None    Learning  Preferences None             General Cardiac Education Topics:  AED/CPR: - Group verbal and written instruction with the use of models to  demonstrate the basic use of the AED with the basic ABC's of resuscitation.   Anatomy and Cardiac Procedures: - Group verbal and visual presentation and models provide information about basic cardiac anatomy and function. Reviews the testing methods done to diagnose heart disease and the outcomes of the test results. Describes the treatment choices: Medical Management, Angioplasty, or Coronary Bypass Surgery for treating various heart conditions including Myocardial Infarction, Angina, Valve Disease, and Cardiac Arrhythmias.  Written material given at graduation.   Medication Safety: - Group verbal and visual instruction to review commonly prescribed medications for heart and lung disease. Reviews the medication, class of the drug, and side effects. Includes the steps to properly store meds and maintain the prescription regimen.  Written material given at graduation. Flowsheet Row Cardiac Rehab from 02/02/2022 in [facility] Cardiac and Pulmonary Rehab  Education need identified 02/02/22       Intimacy: - Group verbal instruction through game format to discuss how heart and lung disease can affect sexual intimacy. Written material given at graduation..   Know Your Numbers and Heart Failure: - Group verbal and visual instruction to discuss disease risk factors for cardiac and pulmonary disease and treatment options.  Reviews associated critical values for Overweight/Obesity, Hypertension, Cholesterol, and Diabetes.  Discusses basics of heart failure: signs/symptoms and treatments.  Introduces Heart Failure Zone chart for action plan for heart failure.  Written material given at graduation.   Infection Prevention: - Provides verbal and written material to individual with discussion of infection control including proper hand washing and proper equipment cleaning during exercise session. Flowsheet Row Cardiac Rehab from 01/03/2022 in [facility] Cardiac and Pulmonary Rehab  Date 01/03/22  Educator [facility]   Instruction Review Code 1- Verbalizes Understanding       Falls Prevention: - Provides verbal and written material to individual with discussion of falls prevention and safety. Flowsheet Row Cardiac Rehab from 01/03/2022 in [facility] Cardiac and Pulmonary Rehab  Date 01/03/22  Educator [facility]  Instruction Review Code 1- Verbalizes Understanding       Other: -Provides group and verbal instruction on various topics (see comments)   Knowledge Questionnaire Score:  Knowledge Questionnaire Score - 02/02/22 0945       Knowledge Questionnaire Score   Pre Score 24/26             Core Components/Risk Factors/Patient Goals at Admission:  Personal Goals and Risk Factors at Admission - 02/02/22 1115       Core Components/Risk Factors/Patient Goals on Admission    Weight Management Yes;Weight Maintenance   Used to be 200lb, lost weight to ~168, gained up to 184 lb the last months   Intervention Weight Management: Develop a combined nutrition and exercise program designed to reach desired caloric intake, while maintaining appropriate intake of nutrient and fiber, sodium and fats, and appropriate energy expenditure required for the weight goal.;Weight Management: Provide education and appropriate resources to help participant work on and attain dietary goals.;Weight Management/Obesity: Establish reasonable short term and long term weight goals.    Admit Weight 184 lb (83.5 kg)    Goal Weight: Short Term 184 lb (83.5 kg)    Goal Weight: Long Term 184 lb (83.5 kg)    Expected Outcomes Short Term: Continue to assess and modify interventions until short term weight is achieved;Long Term:  Adherence to nutrition and physical activity/exercise program aimed toward attainment of established weight goal;Weight Maintenance: Understanding of the daily nutrition guidelines, which includes 25-35% calories from fat, 7% or less cal from saturated fats, less than '200mg'$  cholesterol, less than 1.5gm of sodium, & 5  or more servings of fruits and vegetables daily;Understanding recommendations for meals to include 15-35% energy as protein, 25-35% energy from fat, 35-60% energy from carbohydrates, less than '200mg'$  of dietary cholesterol, 20-35 gm of total fiber daily;Understanding of distribution of calorie intake throughout the day with the consumption of 4-5 meals/snacks    Tobacco Cessation Yes    Number of packs per day 0. Quit on 10/10/21    Intervention Assist the participant in steps to quit. Provide individualized education and counseling about committing to Tobacco Cessation, relapse prevention, and pharmacological support that can be provided by physician.;Advice worker, assist with locating and accessing local/national Quit Smoking programs, and support quit date choice.    Expected Outcomes Short Term: Will demonstrate readiness to quit, by selecting a quit date.;Short Term: Will quit all tobacco product use, adhering to prevention of relapse plan.;Long Term: Complete abstinence from all tobacco products for at least 12 months from quit date.    Hypertension Yes    Intervention Provide education on lifestyle modifcations including regular physical activity/exercise, weight management, moderate sodium restriction and increased consumption of fresh fruit, vegetables, and low fat dairy, alcohol moderation, and smoking cessation.;Monitor prescription use compliance.    Expected Outcomes Short Term: Continued assessment and intervention until BP is < 140/49m HG in hypertensive participants. < 130/855mHG in hypertensive participants with diabetes, heart failure or chronic kidney disease.;Long Term: Maintenance of blood pressure at goal levels.    Lipids Yes    Intervention Provide education and support for participant on nutrition & aerobic/resistive exercise along with prescribed medications to achieve LDL '70mg'$ , HDL >'40mg'$ .    Expected Outcomes Short Term: Participant states understanding of  desired cholesterol values and is compliant with medications prescribed. Participant is following exercise prescription and nutrition guidelines.;Long Term: Cholesterol controlled with medications as prescribed, with individualized exercise RX and with personalized nutrition plan. Value goals: LDL < '70mg'$ , HDL > 40 mg.             Education:Diabetes - Individual verbal and written instruction to review signs/symptoms of diabetes, desired ranges of glucose level fasting, after meals and with exercise. Acknowledge that pre and post exercise glucose checks will be done for 3 sessions at entry of program.   Core Components/Risk Factors/Patient Goals Review:    Core Components/Risk Factors/Patient Goals at Discharge (Final Review):    ITP Comments:  ITP Comments     Row Name 01/03/22 0955 02/02/22 0943 02/14/22 0836 02/16/22 0636     ITP Comments Virtual Visit completed. Patient informed on EP and RD appointment and 6 Minute walk test. Patient also informed of patient health questionnaires on My Chart. Patient Verbalizes understanding. [facility]. Completed 6MWT and gym orientation. Initial ITP created and sent for review to Dr. MaAvelina LaineMedical Director. First full day of exercise!  Patient was oriented to gym and equipment including functions, settings, policies, and procedures.  Patient's individual exercise prescription and treatment plan were reviewed.  All starting workloads were established based on the results of the 6 minute walk test done at initial orientation visit.  The plan for exercise progression was also introduced and progression will be customized based on patient's performance and goals. [place]  Day review completed. Medical Director ITP review done, changes made as directed, and signed approval by Medical Director.    NEW             Comments:

## 2022-02-18 ENCOUNTER — Encounter: Payer: PPO | Admitting: *Deleted

## 2022-02-18 DIAGNOSIS — Z952 Presence of prosthetic heart valve: Secondary | ICD-10-CM

## 2022-02-18 NOTE — Progress Notes (Signed)
Daily Session Note  Patient Details  Name: [name] MRN: [id number] Date of Birth: 03/30/1956 Referring Provider:   Flowsheet Row Cardiac Rehab from 02/02/2022 in [facility] Cardiac and Pulmonary Rehab  Referring Provider Isaias Cowman MD       Encounter Date: 02/18/2022  Check In:  Session Check In - 02/18/22 0759       Check-In   Supervising physician immediately available to respond to emergencies See telemetry face sheet for immediately available ER MD    Location [facility]-Cardiac & Pulmonary Rehab    Staff Present Nyoka Cowden, RN, BSN, [place], BS, RRT, CPFT;Jessica Luan Pulling, MA, RCEP, CCRP, CCET    Virtual Visit No    Medication changes reported     No    Fall or balance concerns reported    No    Tobacco Cessation No Change    Warm-up and Cool-down Performed on first and last piece of equipment    Resistance Training Performed Yes    VAD Patient? No    PAD/SET Patient? No      Pain Assessment   Currently in Pain? No/denies                Social History   Tobacco Use  Smoking Status Former   Packs/day: 1.00   Years: 46.00   Total pack years: 46.00   Types: Cigarettes   Quit date: 10/09/2021   Years since quitting: 0.3  Smokeless Tobacco Never    Goals Met:  Independence with exercise equipment Exercise tolerated well No report of concerns or symptoms today  Goals Unmet:  Not Applicable  Comments: Pt able to follow exercise prescription today without complaint.  Will continue to monitor for progression.    Dr. Emily Filbert is Medical Director for [facility].  Dr. Ottie Glazier is Medical Director for [facility] Pulmonary Rehabilitation.

## 2022-02-21 ENCOUNTER — Encounter: Payer: PPO | Admitting: *Deleted

## 2022-02-21 DIAGNOSIS — Z952 Presence of prosthetic heart valve: Secondary | ICD-10-CM

## 2022-02-21 NOTE — Progress Notes (Signed)
Daily Session Note  Patient Details  Name: Juvencio Verdi MRN: [id number] Date of Birth: Nov 21, 1955 Referring Provider:   Flowsheet Row Cardiac Rehab from 02/02/2022 in [facility] Cardiac and Pulmonary Rehab  Referring Provider Isaias Cowman MD       Encounter Date: 02/21/2022  Check In:  Session Check In - 02/21/22 0920       Check-In   Supervising physician immediately available to respond to emergencies See telemetry face sheet for immediately available ER MD    Staff Present Heath Lark, RN, BSN, CCRP;Laureen Owens Shark, BS, RRT, CPFT;Kelly Amedeo Plenty, BS, ACSM CEP, Exercise Physiologist    Virtual Visit No    Medication changes reported     No    Fall or balance concerns reported    No    Tobacco Cessation No Change    Current number of cigarettes/nicotine per day     0    Warm-up and Cool-down Performed on first and last piece of equipment    Resistance Training Performed Yes    VAD Patient? No    PAD/SET Patient? No      Pain Assessment   Currently in Pain? No/denies                Social History   Tobacco Use  Smoking Status Former   Packs/day: 1.00   Years: 46.00   Total pack years: 46.00   Types: Cigarettes   Quit date: 10/09/2021   Years since quitting: 0.3  Smokeless Tobacco Never    Goals Met:  Independence with exercise equipment Exercise tolerated well No report of concerns or symptoms today  Goals Unmet:  Not Applicable  Comments: Pt able to follow exercise prescription today without complaint.  Will continue to monitor for progression.    Dr. Emily Filbert is Medical Director for [place].  Dr. Ottie Glazier is Medical Director for [facility] Pulmonary Rehabilitation.

## 2022-02-21 NOTE — Therapy (Signed)
OUTPATIENT PHYSICAL THERAPY VESTIBULAR EVALUATION   Patient Name: [name] MRN: [id number] DOB:10-15-1955, 66 y.o., male Today's Date: 02/22/2022  PCP: Leonel Ramsay, MD REFERRING PROVIDER: Leonel Ramsay, MD   PT End of Session - 02/22/22 1314     Visit Number 1    Number of Visits 9    Date for PT Re-Evaluation 04/19/22    Authorization Type eval: 02/22/22    PT Start Time 1315    PT Stop Time 1400    PT Time Calculation (min) 45 min    Activity Tolerance Patient tolerated treatment well    Behavior During Therapy [facility] for tasks assessed/performed             Past Medical History:  Diagnosis Date   Hypertension    History reviewed. No pertinent surgical history. 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 Ola Spurr 05/20/22    SUBJECTIVE:   Chief Complaint:  Dizziness   Pertinent History Pt complains of dizziness x 1-2 months. It occurs spontaneously and it isn't associated with any movement. Occasionally he gets vertigo and other times it feels like he is going to pass out. He denies any heart palpitations or chest pain during the episodes. Per patient his wife took his blood pressure during one of the episodes as instructed and it was normal. Pt has a complex cardiac history and was previously admitted at [facility] April 14. 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 He had a complicated course including developing  abdominal pain, lactic acidosis and concern for bowel ischemia. He has right endophthalmitis as well and is following with ophthalmology. He is now reporting approximately 50% improvement in the vision in his right eye. Pt was treated with IV vancomycin and ceftriaxone and followed by infectious disease at [facility]. Vancomycin and ceftriaxone were completed May 26. Per PCP note pt was experiencing bradycardia with his HR down to around 41 bpm so his metoprolol has stopped. Per wife pt had a holter monitor after discharge without any abnormal findings. He has a follow-up with cardiology 03/30/22. 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 He denies any dizziness when looking under a vehicle. PMH includes hypertension, AR, diverticulitis, GERD and HLD.  He takes no medications currently and has been on meds for HTN and HLD but not in 9-10 years. Per PCP pt has had MRI of his brain that showed his prior stroke in April. 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   Description of dizziness: vertigo and lightheaded Frequency: occasionally multiple times a day, other times it won't occur for multiple days Duration: 1-2 minutes Symptom nature: intermittent Progression of symptoms since onset: no change History of similar episodes: Yes, prior to heart surgery he had episodes where his R eye would black out  Provocative Factors: Unknown Easing Factors: waiting for symptoms to pass  Auditory complaints (  tinnitus, pain, drainage, hearing loss, aural fullness): No Vision changes (diplopia, visual field loss, recent changes, recent eye exam): No Chest pain/palpitations: No History of head injury/concussion: No Stress/anxiety: No SOB/DOE: Denies; Headaches: No, history of headaches years ago but no current headaches. Denies history of migraines Numbness/tingling: Denies; Focal Weakness: L  shoulder weakness (chronic);  Has patient fallen in last 6 months? No Pertinent pain: No Dominant hand: right Imaging: Yes, see history   Prior level of function: Independent Occupational demands: part-time Dealer Hobbies: exercising, spending time with grandchildren and going their sports games  Red Flags: Denies dysarthria, dysphagia, drop attacks, bowel and bladder changes, recent weight loss/gain. He has been gaining some weight after losing weight after his heart surgery.   PRECAUTIONS: None  WEIGHT BEARING RESTRICTIONS No  LIVING ENVIRONMENT: Lives with: lives with their spouse and grandchildren Lives in: House/apartment Stairs: Yes; 5 steps to enter with R handrail  Has following equipment at home: None  PATIENT GOALS: Decrease dizziness   OBJECTIVE  EXAMINATION  POSTURE: No gross deficits contributing to symptoms  NEUROLOGICAL SCREEN: (2+ unless otherwise noted.) N=normal  Ab=abnormal  Level Dermatome R L Myotome R L Reflex R L  C3 Anterior Neck N N Sidebend C2-3 N N Jaw CN V    C4 Top of Shoulder N N Shoulder Shrug C4 N N Hoffman's UMN    C5 Lateral Upper Arm N N Shoulder ABD C4-5 N N Biceps C5-6    C6 Lateral Arm/ Thumb N N Arm Flex/ Wrist Ext C5-6 N N Brachiorad. C5-6    C7 Middle Finger N N Arm Ext//Wrist Flex C6-7 N N Triceps C7    C8 4th & 5th Finger N N Flex/ Ext Carpi Ulnaris C8 N N Patellar (L3-4)    T1 Medial Arm N N Interossei T1 N N Gastrocnemius    L2 Medial thigh/groin N N Illiopsoas (L2-3) N N     L3 Lower thigh/med.knee N N Quadriceps (L3-4) N N     L4 Medial leg/lat thigh N N Tibialis Ant (L4-5) N N     L5 Lat. leg & dorsal foot N N EHL (L5) N N     S1 post/lat foot/thigh/leg N N Gastrocnemius (S1-2) N N     S2 Post./med. thigh & leg N N Hamstrings (L4-S3) N N       CRANIAL NERVES II, III, IV, VI: Pupils equal and reactive to light, visual acuity and visual fields are intact, extraocular muscles are intact  V: Facial sensation is intact  and symmetric bilaterally  VII: Facial strength is intact and symmetric bilaterally, pt does have some mild ptosis in R eyelid. Wife believes that this has worsened since his heart surgery VIII: Hearing is normal as tested by gross conversation IX, X: Palate elevates midline, normal phonation, uvula midline XI: Shoulder shrug strength is intact  XII: Tongue protrudes midline    SOMATOSENSORY Grossly intact to light touch bilateral UEs/LEs as determined by testing dermatomes C2-T2 and L2-S2. Proprioception and hot/cold testing deferred on this date.   COORDINATION Finger to Nose: Normal Heel to Shin: Normal Pronator Drift: Negative Rapid Alternating Movements: Normal Finger to Thumb Opposition: Normal    RANGE OF MOTION Cervical Spine AROM is painless in all directions but severe loss of range of motion in lateral flexion bilaterally and extension. No focal deficits in AROM noted in BUE/BLE   MANUAL MUSCLE TESTING BUE/BLE strength WNL without focal deficits   TRANSFERS/GAIT Independent for transfers and ambulation without assistive  device    PATIENT SURVEYS FOTO: 65, predicted improvement to 67 ABC: Deferred DHI: 4/100   POSTURAL CONTROL TESTS  Clinical Test of Sensory Interaction for Balance (CTSIB): Deferred   OCULOMOTOR / VESTIBULAR TESTING  Oculomotor Exam- Room Light  Findings Comments  Ocular Alignment normal   Ocular ROM normal   Spontaneous Nystagmus normal   Gaze-Holding Nystagmus normal   End-Gaze Nystagmus normal   Vergence (normal 2-3") normal   Smooth Pursuit normal   Cross-Cover Test not examined   Saccades normal   VOR Cancellation normal   Left Head Impulse normal   Right Head Impulse normal   Static Acuity not examined   Dynamic Acuity not examined     Oculomotor Exam- Fixation Suppressed: Deferred   BPPV TESTS:  Symptoms Duration Intensity Nystagmus  L Dix-Hallpike None   None  R Dix-Hallpike None   None  L Head Roll None   None   R Head Roll None   None  L Sidelying Test      R Sidelying Test      (blank = not tested)   Orthostatic Vitals: Supine: BP: 151/84 mmHg, HR: 60 bpm, SpO2: 100% Seated: BP: 138/81 mmHg, HR: 81 bpm SpO2: 100% Standing (1 minute): BP: 135/80 mmHg, HR: 74 bpm, SpO2:100% Standing (3 minutes): BP: 150/84 mmHg, HR: 73 bpm SpO2: 100%   FUNCTIONAL OUTCOME MEASURES   Results Comments  BERG    DGI    FGA    TUG    5TSTS    6 Minute Walk Test    10 Meter Gait Speed    (blank = not tested)   TODAY'S TREATMENT  None    ASSESSMENT:  CLINICAL IMPRESSION: Patient is a 66 y.o. male who was seen today for physical therapy evaluation and treatment for dizzness. Objective impairments include dizziness. Unable to reproduce patient's symptoms during visit today. All of his oculomotor/vestibular testing is WNL and orthostatic vitals are negative. All BPPV testing is negative on this date. Low suspicion that this is the cause for patient's symptoms however will repeat testing again at first follow-up visit on inverted table to place pt in more exaggerated head down position. Pt struggles to describe his symptoms however states that he feels like he is going to pass out. He is unsure if he truly is going to pass out or if he simply becomes fearful of syncope when symptoms start. Pt is unable to report any known triggers and denies positional changes as inciting events. In fact he states that most of the time the symptoms occur when he is sitting still. Will perform additional vestibular/oculomotor testing with fixation suppression at first follow-up visit. These impairments are limiting patient from driving, community activity, and occupation. Personal factors including Time since onset of injury/illness/exacerbation and 3+ comorbidities: HTN, hyperlipidemia, smoking, CVA, and recent aortic valve and aortic root replacement  are also affecting patient's functional outcome. Patient will benefit from skilled  PT to address above impairments and improve overall function.  REHAB POTENTIAL: Fair    CLINICAL DECISION MAKING: Unstable/unpredictable  EVALUATION COMPLEXITY: High   GOALS: Goals reviewed with patient? No  SHORT TERM GOALS: Target date: 03/22/2022  Pt will be independent with HEP for dizziness in order to decrease symptoms, improve balance,decrease fall risk, and improve function at home. Baseline: Goal status: INITIAL   LONG TERM GOALS: Target date: 04/19/2022  Pt will increase FOTO to at least 67 to demonstrate significant improvement in function at home related to dizziness.  Baseline: 02/22/22: 66 Goal status: INITIAL  2.  Pt will decrease 100% resolution of his symptoms in order to demonstrate clinically significant reduction in disability related to dizziness.  Baseline:  Goal status: INITIAL    PLAN:  PT FREQUENCY: 1x/week  PT DURATION: 8 weeks  PLANNED INTERVENTIONS: Therapeutic exercises, Therapeutic activity, Neuromuscular re-education, Balance training, Gait training, Patient/Family education, Joint manipulation, Joint mobilization, Canalith repositioning, Aquatic Therapy, Dry Needling, Cognitive remediation, Electrical stimulation, Spinal manipulation, Spinal mobilization, Cryotherapy, Moist heat, Traction, Ultrasound, Ionotophoresis '4mg'$ /ml Dexamethasone, and Manual therapy  PLAN FOR NEXT SESSION: BPPV testing on inverted mat table, fixation suppression vestibular/oculomotor testing, mCTSIB, DGI/FGA   Lyndel Safe Kolbey Teichert PT, DPT, GCS  Loyda Costin 02/22/2022, 5:38 PM

## 2022-02-22 ENCOUNTER — Ambulatory Visit: Payer: PPO | Attending: Infectious Diseases

## 2022-02-22 DIAGNOSIS — R42 Dizziness and giddiness: Secondary | ICD-10-CM | POA: Diagnosis not present

## 2022-02-23 ENCOUNTER — Encounter: Payer: PPO | Admitting: *Deleted

## 2022-02-23 DIAGNOSIS — Z952 Presence of prosthetic heart valve: Secondary | ICD-10-CM

## 2022-02-23 NOTE — Progress Notes (Signed)
Daily Session Note  Patient Details  Name: Tyshawn Ciullo MRN: [id number] Date of Birth: [birth year] Referring Provider:   Flowsheet Row Cardiac Rehab from 02/02/2022 in [facility] Cardiac and Pulmonary Rehab  Referring Provider Isaias Cowman MD       Encounter Date: 02/23/2022  Check In:  Session Check In - 02/23/22 0828       Check-In   Supervising physician immediately available to respond to emergencies See telemetry face sheet for immediately available ER MD    Location [facility]-Cardiac & Pulmonary Rehab    Staff Present Heath Lark, RN, BSN, CCRP;Jessica Lillie, MA, RCEP, CCRP, CCET;Joseph [place], RCP,RRT,BSRT;Noah [place], [place], Exercise Physiologist;Melissa [place], [place], LDN    Virtual Visit No    Medication changes reported     No    Fall or balance concerns reported    No    Warm-up and Cool-down Performed on first and last piece of equipment    Resistance Training Performed Yes    VAD Patient? No    PAD/SET Patient? No      Pain Assessment   Currently in Pain? No/denies                Social History   Tobacco Use  Smoking Status Former   Packs/day: 1.00   Years: 46.00   Total pack years: 46.00   Types: Cigarettes   Quit date: 10/09/2021   Years since quitting: 0.3  Smokeless Tobacco Never    Goals Met:  Independence with exercise equipment Exercise tolerated well No report of concerns or symptoms today  Goals Unmet:  Not Applicable  Comments: Pt able to follow exercise prescription today without complaint.  Will continue to monitor for progression.  Reviewed home exercise with pt today.  Pt plans to walk and go back to [facility] for exercise.  Reviewed THR, pulse, RPE, sign and symptoms, pulse oximetery and when to call 911 or MD.  Also discussed weather considerations and indoor options.  Pt voiced understanding.   Dr. Emily Filbert is Medical Director for [place].  Dr. Ottie Glazier is Medical Director for [facility]  Pulmonary Rehabilitation.

## 2022-02-28 ENCOUNTER — Encounter: Payer: PPO | Admitting: *Deleted

## 2022-02-28 DIAGNOSIS — Z952 Presence of prosthetic heart valve: Secondary | ICD-10-CM

## 2022-02-28 NOTE — Therapy (Signed)
OUTPATIENT PHYSICAL THERAPY VESTIBULAR TREATMENT   Patient Name: [name] MRN: [id number] DOB:Oct 06, 1955, 66 y.o., male Today's Date: 03/01/2022  PCP: Leonel Ramsay, MD REFERRING PROVIDER: [place] - 03/01/22 2376     Visit Number 2    Number of Visits 9    Date for PT Re-Evaluation 04/19/22    Authorization Type eval: 02/22/22    PT Start Time 0804    PT Stop Time 0847    PT Time Calculation (min) 43 min    Activity Tolerance Patient tolerated treatment well    Behavior During Therapy [facility] for tasks assessed/performed              Past Medical History:  Diagnosis Date   Hypertension    History reviewed. No pertinent surgical history. 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 Ola Spurr 05/20/22    SUBJECTIVE:   Chief Complaint:  Dizziness   Pertinent History Pt complains of dizziness x 1-2 months. It occurs spontaneously and it isn't associated with any movement. Occasionally he gets vertigo and other times it feels like he is going to pass out. He denies any heart palpitations or chest pain during the episodes. Per patient his wife took his blood pressure during one of the episodes as instructed and it was normal. Pt has a complex cardiac history and was previously admitted at [facility] April 14. 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 He had a complicated course including developing abdominal pain, lactic acidosis and concern for  bowel ischemia. He has right endophthalmitis as well and is following with ophthalmology. He is now reporting approximately 50% improvement in the vision in his right eye. Pt was treated with IV vancomycin and ceftriaxone and followed by infectious disease at [facility]. Vancomycin and ceftriaxone were completed May 26. Per PCP note pt was experiencing bradycardia with his HR down to around 41 bpm so his metoprolol has stopped. Per wife pt had a holter monitor after discharge without any abnormal findings. He has a follow-up with cardiology 03/30/22. 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 He denies any dizziness when looking under a vehicle. PMH includes hypertension, AR, diverticulitis, GERD and HLD.  He takes no medications currently and has been on meds for HTN and HLD but not in 9-10 years. Per PCP pt has had MRI of his brain that showed his prior stroke in April. 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   Description of dizziness: vertigo and lightheaded Frequency: occasionally multiple times a day, other times it won't occur for multiple days Duration: 1-2 minutes Symptom nature: intermittent Progression of symptoms since onset: no change History of similar episodes: Yes, prior to heart surgery he had episodes where his R eye would black out  Provocative Factors: Unknown Easing Factors: waiting for symptoms to pass  Auditory complaints (tinnitus, pain, drainage, hearing loss, aural fullness): No  Vision changes (diplopia, visual field loss, recent changes, recent eye exam): No Chest pain/palpitations: No History of head injury/concussion: No Stress/anxiety: No SOB/DOE: Denies; Headaches: No, history of headaches years ago but no current headaches. Denies history of migraines Numbness/tingling: Denies; Focal Weakness: L shoulder weakness (chronic);  Has patient fallen in  last 6 months? No Pertinent pain: No Dominant hand: right Imaging: Yes, see history   Prior level of function: Independent Occupational demands: part-time Dealer Hobbies: exercising, spending time with grandchildren and going their sports games  Red Flags: Denies dysarthria, dysphagia, drop attacks, bowel and bladder changes, recent weight loss/gain. He has been gaining some weight after losing weight after his heart surgery.   PRECAUTIONS: None  WEIGHT BEARING RESTRICTIONS No  LIVING ENVIRONMENT: Lives with: lives with their spouse and grandchildren Lives in: House/apartment Stairs: Yes; 5 steps to enter with R handrail  Has following equipment at home: None  PATIENT GOALS: Decrease dizziness   OBJECTIVE  EXAMINATION  POSTURE: No gross deficits contributing to symptoms  NEUROLOGICAL SCREEN: (2+ unless otherwise noted.) N=normal  Ab=abnormal  Level Dermatome R L Myotome R L Reflex R L  C3 Anterior Neck N N Sidebend C2-3 N N Jaw CN V    C4 Top of Shoulder N N Shoulder Shrug C4 N N Hoffman's UMN    C5 Lateral Upper Arm N N Shoulder ABD C4-5 N N Biceps C5-6    C6 Lateral Arm/ Thumb N N Arm Flex/ Wrist Ext C5-6 N N Brachiorad. C5-6    C7 Middle Finger N N Arm Ext//Wrist Flex C6-7 N N Triceps C7    C8 4th & 5th Finger N N Flex/ Ext Carpi Ulnaris C8 N N Patellar (L3-4)    T1 Medial Arm N N Interossei T1 N N Gastrocnemius    L2 Medial thigh/groin N N Illiopsoas (L2-3) N N     L3 Lower thigh/med.knee N N Quadriceps (L3-4) N N     L4 Medial leg/lat thigh N N Tibialis Ant (L4-5) N N     L5 Lat. leg & dorsal foot N N EHL (L5) N N     S1 post/lat foot/thigh/leg N N Gastrocnemius (S1-2) N N     S2 Post./med. thigh & leg N N Hamstrings (L4-S3) N N       CRANIAL NERVES II, III, IV, VI: Pupils equal and reactive to light, visual acuity and visual fields are intact, extraocular muscles are intact  V: Facial sensation is intact and symmetric bilaterally  VII: Facial strength is  intact and symmetric bilaterally, pt does have some mild ptosis in R eyelid. Wife believes that this has worsened since his heart surgery VIII: Hearing is normal as tested by gross conversation IX, X: Palate elevates midline, normal phonation, uvula midline XI: Shoulder shrug strength is intact  XII: Tongue protrudes midline    SOMATOSENSORY Grossly intact to light touch bilateral UEs/LEs as determined by testing dermatomes C2-T2 and L2-S2. Proprioception and hot/cold testing deferred on this date.   COORDINATION Finger to Nose: Normal Heel to Shin: Normal Pronator Drift: Negative Rapid Alternating Movements: Normal Finger to Thumb Opposition: Normal    RANGE OF MOTION Cervical Spine AROM is painless in all directions but severe loss of range of motion in lateral flexion bilaterally and extension. No focal deficits in AROM noted in BUE/BLE   MANUAL MUSCLE TESTING BUE/BLE strength WNL without focal deficits   TRANSFERS/GAIT Independent for transfers and ambulation without assistive device    PATIENT SURVEYS FOTO: 65,  predicted improvement to 67 ABC: Deferred DHI: 4/100   POSTURAL CONTROL TESTS  Clinical Test of Sensory Interaction for Balance (CTSIB): Deferred   OCULOMOTOR / VESTIBULAR TESTING  Oculomotor Exam- Room Light  Findings Comments  Ocular Alignment normal   Ocular ROM normal   Spontaneous Nystagmus normal   Gaze-Holding Nystagmus normal   End-Gaze Nystagmus normal   Vergence (normal 2-3") normal   Smooth Pursuit normal   Cross-Cover Test not examined   Saccades normal   VOR Cancellation normal   Left Head Impulse normal   Right Head Impulse normal   Static Acuity not examined   Dynamic Acuity not examined     Oculomotor Exam- Fixation Suppressed: Deferred   BPPV TESTS:  Symptoms Duration Intensity Nystagmus  L Dix-Hallpike None   None  R Dix-Hallpike None   None  L Head Roll None   None  R Head Roll None   None  L Sidelying Test      R  Sidelying Test      (blank = not tested)   Orthostatic Vitals: Supine: BP: 151/84 mmHg, HR: 60 bpm, SpO2: 100% Seated: BP: 138/81 mmHg, HR: 81 bpm SpO2: 100% Standing (1 minute): BP: 135/80 mmHg, HR: 74 bpm, SpO2:100% Standing (3 minutes): BP: 150/84 mmHg, HR: 73 bpm SpO2: 100%     TODAY'S TREATMENT   SUBJECTIVE: Pt reports that he is doing well today. He has not had any additional episodes of dizziness since the initial evaluation. He continues to work with cardiac rehab and states that his endurance is improving. He denies any recent chest pain, SOB, or DOE. No specific questions or concerns.    PAIN: No   Neuromuscular Re-education   Oculomotor Exam- Fixation Suppressed  Findings Comments  Ocular Alignment normal   Spontaneous Nystagmus normal   Gaze-Holding Nystagmus normal   End-Gaze Nystagmus normal   Head Shaking Nystagmus normal   Pressure-Induced Nystagmus normal   Hyperventilation Induced Nystagmus not examined   Skull Vibration Induced Nystagmus not examined     BPPV TESTS (on inverted mat table):  Symptoms Duration Intensity Nystagmus  L Dix-Hallpike None   None  R Dix-Hallpike None   None  L Head Roll None   None  R Head Roll None   None  (blank = not tested) Pt denies dizziness with all position changes  mCTSIB: 30s in all 4 conditions;   FUNCTIONAL OUTCOME MEASURES  03/01/22 Comments  BERG 56/56 WNL  DGI    FGA 29/30 WNL  TUG    5TSTS 10.1s WNL  6 Minute Walk Test    10 Meter Gait Speed >1.0 m/s WNL  (blank = not tested)   PATIENT EDUCATION:  Education details: Plan of care Person educated: Patient and Spouse Education method: Explanation Education comprehension: verbalized understanding   HOME EXERCISE PROGRAM:  None   ASSESSMENT:  CLINICAL IMPRESSION: Performed additional balance and vestibular testing during visit today. Dix-Hallpike and Roll Tests are negative bilaterally for vertigo and nystagmus on an inverted mat table.  Fixation suppression testing with infrared goggles is WNL. Pt demonstrates excellent balance scoring 56/56 on the BERG and 29/30 on the FGA.  He is able to balance for 30s in all four conditions of the mCTSIB. Five Time Sit to Stand and self-selected gait speed are WNL. Therapist spoke with cardiac rehab RN who reports patient has not demonstrated any abnormal cardiac rhythms during exercise sessions. Patient has not had any further episodes of dizziness over the last week. No significant  findings or reproduction of dizziness during initial evaluation and first follow-up treatment session. Symptoms appear to have stopped. Next therapy session scheduled for 3 weeks out and pt encouraged to call to notify therapist if he has symptoms before that time for earlier follow-up appointment. If no further symptoms pt can cancel next appointment as desired. If he returns will repeat testing to try to reproduce symptoms. [place] has an appointment to see cardiology on 03/30/22.  REHAB POTENTIAL: Fair    CLINICAL DECISION MAKING: Unstable/unpredictable  EVALUATION COMPLEXITY: High   GOALS: Goals reviewed with patient? No  SHORT TERM GOALS: Target date: 03/29/2022  Pt will be independent with HEP for dizziness in order to decrease symptoms, improve balance,decrease fall risk, and improve function at home. Baseline: Goal status: INITIAL   LONG TERM GOALS: Target date: 04/26/2022  Pt will increase FOTO to at least 67 to demonstrate significant improvement in function at home related to dizziness.  Baseline: 02/22/22: 65 Goal status: INITIAL  2.  Pt will decrease 100% resolution of his symptoms in order to demonstrate clinically significant reduction in disability related to dizziness.  Baseline:  Goal status: INITIAL    PLAN:  PT FREQUENCY: 1x/week  PT DURATION: 8 weeks  PLANNED INTERVENTIONS: Therapeutic exercises, Therapeutic activity, Neuromuscular re-education, Balance training, Gait training,  Patient/Family education, Joint manipulation, Joint mobilization, Canalith repositioning, Aquatic Therapy, Dry Needling, Cognitive remediation, Electrical stimulation, Spinal manipulation, Spinal mobilization, Cryotherapy, Moist heat, Traction, Ultrasound, Ionotophoresis '4mg'$ /ml Dexamethasone, and Manual therapy  PLAN FOR NEXT SESSION: Repeat testing as needed otherwise discharge   Lyndel Safe Temima Kutsch PT, DPT, GCS  Kandiss Ihrig 03/01/2022, 9:09 AM

## 2022-02-28 NOTE — Progress Notes (Signed)
Completed initial RD consultation ?

## 2022-02-28 NOTE — Progress Notes (Signed)
Daily Session Note  Patient Details  Name: [name] MRN: [id number] Date of Birth: May 29, 1956 Referring Provider:   Flowsheet Row Cardiac Rehab from 02/02/2022 in [facility] Cardiac and Pulmonary Rehab  Referring Provider Isaias Cowman MD       Encounter Date: 02/28/2022  Check In:  Session Check In - 02/28/22 0820       Check-In   Supervising physician immediately available to respond to emergencies See telemetry face sheet for immediately available ER MD    Location [facility]-Cardiac & Pulmonary Rehab    Staff Present Heath Lark, RN, BSN, CCRP;Joseph [place], RCP,RRT,BSRT;Kelly [place], [place], ACSM CEP, Exercise Physiologist    Virtual Visit No    Medication changes reported     No    Fall or balance concerns reported    No    Warm-up and Cool-down Performed on first and last piece of equipment    Resistance Training Performed Yes    VAD Patient? No    PAD/SET Patient? No      Pain Assessment   Currently in Pain? No/denies                Social History   Tobacco Use  Smoking Status Former   Packs/day: 1.00   Years: 46.00   Total pack years: 46.00   Types: Cigarettes   Quit date: 10/09/2021   Years since quitting: 0.3  Smokeless Tobacco Never    Goals Met:  Independence with exercise equipment Exercise tolerated well No report of concerns or symptoms today  Goals Unmet:  Not Applicable  Comments: Pt able to follow exercise prescription today without complaint.  Will continue to monitor for progression.    Dr. Emily Filbert is Medical Director for [place].  Dr. Ottie Glazier is Medical Director for [facility] Pulmonary Rehabilitation.

## 2022-03-01 ENCOUNTER — Ambulatory Visit: Payer: PPO

## 2022-03-01 DIAGNOSIS — R42 Dizziness and giddiness: Secondary | ICD-10-CM

## 2022-03-02 ENCOUNTER — Encounter: Payer: PPO | Admitting: *Deleted

## 2022-03-02 DIAGNOSIS — Z952 Presence of prosthetic heart valve: Secondary | ICD-10-CM | POA: Diagnosis not present

## 2022-03-02 NOTE — Progress Notes (Signed)
Daily Session Note  Patient Details  Name: [name] MRN: [id number] Date of Birth: 03/02/1956 Referring Provider:   Flowsheet Row Cardiac Rehab from 02/02/2022 in [facility] Cardiac and Pulmonary Rehab  Referring Provider Isaias Cowman MD       Encounter Date: 03/02/2022  Check In:  Session Check In - 03/02/22 0839       Check-In   Supervising physician immediately available to respond to emergencies See telemetry face sheet for immediately available ER MD    Location [facility]-Cardiac & Pulmonary Rehab    Staff Present Heath Lark, RN, BSN, CCRP;Melissa [place], RDN, LDN;Joseph [place], [place], [place], RCEP, CCRP, CCET    Virtual Visit No    Medication changes reported     No    Fall or balance concerns reported    No    Warm-up and Cool-down Performed on first and last piece of equipment    Resistance Training Performed Yes    VAD Patient? No    PAD/SET Patient? No      Pain Assessment   Currently in Pain? No/denies                Social History   Tobacco Use  Smoking Status Former   Packs/day: 1.00   Years: 46.00   Total pack years: 46.00   Types: Cigarettes   Quit date: 10/09/2021   Years since quitting: 0.3  Smokeless Tobacco Never    Goals Met:  Independence with exercise equipment Exercise tolerated well No report of concerns or symptoms today  Goals Unmet:  Not Applicable  Comments: Pt able to follow exercise prescription today without complaint.  Will continue to monitor for progression.    Dr. Emily Filbert is Medical Director for [place].  Dr. Ottie Glazier is Medical Director for [facility] Pulmonary Rehabilitation.

## 2022-03-04 ENCOUNTER — Encounter: Payer: PPO | Admitting: *Deleted

## 2022-03-04 DIAGNOSIS — Z952 Presence of prosthetic heart valve: Secondary | ICD-10-CM | POA: Diagnosis not present

## 2022-03-04 NOTE — Progress Notes (Signed)
Daily Session Note  Patient Details  Name: [name] MRN: [id number] Date of Birth: 08-26-1955 Referring Provider:   Flowsheet Row Cardiac Rehab from 02/02/2022 in [facility] Cardiac and Pulmonary Rehab  Referring Provider Isaias Cowman MD       Encounter Date: 03/04/2022  Check In:  Session Check In - 03/04/22 0830       Check-In   Supervising physician immediately available to respond to emergencies See telemetry face sheet for immediately available ER MD    Location [facility]-Cardiac & Pulmonary Rehab    Staff Present Heath Lark, RN, BSN, CCRP;Joseph [place], RCP,RRT,BSRT;Jessica [place], [place], [place], CCRP, CCET    Virtual Visit No    Medication changes reported     No    Fall or balance concerns reported    No    Warm-up and Cool-down Performed on first and last piece of equipment    Resistance Training Performed Yes    VAD Patient? No    PAD/SET Patient? No      Pain Assessment   Currently in Pain? No/denies                Social History   Tobacco Use  Smoking Status Former   Packs/day: 1.00   Years: 46.00   Total pack years: 46.00   Types: Cigarettes   Quit date: 10/09/2021   Years since quitting: 0.4  Smokeless Tobacco Never    Goals Met:  Independence with exercise equipment Exercise tolerated well No report of concerns or symptoms today  Goals Unmet:  Not Applicable  Comments: Pt able to follow exercise prescription today without complaint.  Will continue to monitor for progression.    Dr. Emily Filbert is Medical Director for [place].  Dr. Ottie Glazier is Medical Director for [facility] Pulmonary Rehabilitation.

## 2022-03-07 ENCOUNTER — Encounter: Payer: PPO | Admitting: *Deleted

## 2022-03-07 DIAGNOSIS — Z952 Presence of prosthetic heart valve: Secondary | ICD-10-CM

## 2022-03-07 NOTE — Progress Notes (Signed)
Daily Session Note  Patient Details  Name: Odes Lolli MRN: [id number] Date of Birth: 08/05/1955 Referring Provider:   Flowsheet Row Cardiac Rehab from 02/02/2022 in [facility] Cardiac and Pulmonary Rehab  Referring Provider Isaias Cowman MD       Encounter Date: 03/07/2022  Check In:  Session Check In - 03/07/22 0979       Check-In   Supervising physician immediately available to respond to emergencies See telemetry face sheet for immediately available ER MD    Location [facility]-Cardiac & Pulmonary Rehab    Staff Present Heath Lark, RN, BSN, CCRP;Joseph [place], RCP,RRT,BSRT;Kelly [place], [place], ACSM CEP, Exercise Physiologist    Virtual Visit No    Medication changes reported     No    Fall or balance concerns reported    No    Warm-up and Cool-down Performed on first and last piece of equipment    Resistance Training Performed Yes    VAD Patient? No    PAD/SET Patient? No      Pain Assessment   Currently in Pain? No/denies                Social History   Tobacco Use  Smoking Status Former   Packs/day: 1.00   Years: 46.00   Total pack years: 46.00   Types: Cigarettes   Quit date: 10/09/2021   Years since quitting: 0.4  Smokeless Tobacco Never    Goals Met:  Independence with exercise equipment Exercise tolerated well No report of concerns or symptoms today  Goals Unmet:  Not Applicable  Comments: Pt able to follow exercise prescription today without complaint.  Will continue to monitor for progression.    Dr. Emily Filbert is Medical Director for [place].  Dr. Ottie Glazier is Medical Director for [facility] Pulmonary Rehabilitation.

## 2022-03-09 ENCOUNTER — Encounter: Payer: PPO | Admitting: *Deleted

## 2022-03-09 DIAGNOSIS — Z952 Presence of prosthetic heart valve: Secondary | ICD-10-CM

## 2022-03-09 NOTE — Progress Notes (Signed)
Daily Session Note  Patient Details  Name: [name] MRN: [id number] Date of Birth: 1955-10-14 Referring Provider:   Flowsheet Row Cardiac Rehab from 02/02/2022 in [facility] Cardiac and Pulmonary Rehab  Referring Provider Isaias Cowman MD       Encounter Date: 03/09/2022  Check In:  Session Check In - 03/09/22 0939       Check-In   Supervising physician immediately available to respond to emergencies See telemetry face sheet for immediately available ER MD    Location [facility]-Cardiac & Pulmonary Rehab    Staff Present Nyoka Cowden, RN, BSN, Lauretta Grill, RCP,RRT,BSRT;Melissa [place], [place], LDN    Virtual Visit No    Medication changes reported     No    Fall or balance concerns reported    No    Tobacco Cessation No Change    Warm-up and Cool-down Performed on first and last piece of equipment    Resistance Training Performed Yes    VAD Patient? No    PAD/SET Patient? No                Social History   Tobacco Use  Smoking Status Former   Packs/day: 1.00   Years: 46.00   Total pack years: 46.00   Types: Cigarettes   Quit date: 10/09/2021   Years since quitting: 0.4  Smokeless Tobacco Never    Goals Met:  Independence with exercise equipment Exercise tolerated well No report of concerns or symptoms today  Goals Unmet:  Not Applicable  Comments: Pt able to follow exercise prescription today without complaint.  Will continue to monitor for progression.    Dr. Emily Filbert is Medical Director for [place].  Dr. Ottie Glazier is Medical Director for [facility] Pulmonary Rehabilitation.

## 2022-03-11 ENCOUNTER — Encounter: Payer: PPO | Attending: Cardiology | Admitting: *Deleted

## 2022-03-11 DIAGNOSIS — Z48812 Encounter for surgical aftercare following surgery on the circulatory system: Secondary | ICD-10-CM | POA: Insufficient documentation

## 2022-03-11 DIAGNOSIS — Z952 Presence of prosthetic heart valve: Secondary | ICD-10-CM | POA: Diagnosis not present

## 2022-03-11 NOTE — Progress Notes (Signed)
Daily Session Note  Patient Details  Name: [name] MRN: [id number] Date of Birth: 1956-02-21 Referring Provider:   Flowsheet Row Cardiac Rehab from 02/02/2022 in [facility] Cardiac and Pulmonary Rehab  Referring Provider Isaias Cowman MD       Encounter Date: 03/11/2022  Check In:  Session Check In - 03/11/22 1149       Check-In   Supervising physician immediately available to respond to emergencies See telemetry face sheet for immediately available ER MD    Location [facility]-Cardiac & Pulmonary Rehab    Staff Present Nyoka Cowden, RN, BSN, Lauretta Grill, [place], [place], RCEP, CCRP, CCET    Virtual Visit No    Medication changes reported     No    Fall or balance concerns reported    No    Tobacco Cessation No Change    Warm-up and Cool-down Performed on first and last piece of equipment    Resistance Training Performed Yes    VAD Patient? No      Pain Assessment   Currently in Pain? No/denies                Social History   Tobacco Use  Smoking Status Former   Packs/day: 1.00   Years: 46.00   Total pack years: 46.00   Types: Cigarettes   Quit date: 10/09/2021   Years since quitting: 0.4  Smokeless Tobacco Never    Goals Met:  Independence with exercise equipment Exercise tolerated well No report of concerns or symptoms today  Goals Unmet:  Not Applicable  Comments: Pt able to follow exercise prescription today without complaint.  Will continue to monitor for progression.    Dr. Emily Filbert is Medical Director for [place].  Dr. Ottie Glazier is Medical Director for [facility] Pulmonary Rehabilitation.

## 2022-03-16 ENCOUNTER — Encounter: Payer: Self-pay | Admitting: *Deleted

## 2022-03-16 ENCOUNTER — Encounter: Payer: PPO | Admitting: *Deleted

## 2022-03-16 DIAGNOSIS — Z952 Presence of prosthetic heart valve: Secondary | ICD-10-CM

## 2022-03-16 DIAGNOSIS — Z48812 Encounter for surgical aftercare following surgery on the circulatory system: Secondary | ICD-10-CM | POA: Diagnosis not present

## 2022-03-16 NOTE — Progress Notes (Signed)
Cardiac Individual Treatment Plan  Patient Details  Name: [name] MRN: [id number] Date of Birth: [birth year] Referring Provider:   Flowsheet Row Cardiac Rehab from 02/02/2022 in [facility] Cardiac and Pulmonary Rehab  Referring Provider [name]       Initial Encounter Date:  Flowsheet Row Cardiac Rehab from 02/02/2022 in [facility] Cardiac and Pulmonary Rehab  Date 02/02/22       Visit Diagnosis: S/P aortic valve replacement  Patient's Home Medications on Admission:  Current Outpatient Medications:    amLODipine (NORVASC) 10 MG tablet, Take 10 mg by mouth daily., Disp: , Rfl:    aspirin EC 81 MG tablet, Take 81 mg by mouth daily. Swallow whole., Disp: , Rfl:    atorvastatin (LIPITOR) 80 MG tablet, Take 1 tablet (80 mg total) by mouth daily. (Patient not taking: Reported on 01/03/2022), Disp: , Rfl:    Multiple Vitamins-Minerals (MULTIVITAMIN WITH MINERALS) tablet, Take 1 tablet by mouth daily., Disp: , Rfl:    prednisoLONE acetate (PRED FORTE) 1 % ophthalmic suspension, Place 1 drop into the right eye 6 (six) times daily., Disp: , Rfl:   Past Medical History: Past Medical History:  Diagnosis Date   Hypertension     Tobacco Use: Social History   Tobacco Use  Smoking Status Former   Packs/day: 1.00   Years: 46.00   Total pack years: 46.00   Types: Cigarettes   Quit date: 10/09/2021   Years since quitting: 0.4  Smokeless Tobacco Never    Labs: Review Flowsheet       Latest Ref Rng & Units 10/12/2021  Labs for ITP Cardiac and Pulmonary Rehab  Cholestrol 0 - 200 mg/dL 132   LDL (calc) 0 - 99 mg/dL 90   HDL-C >40 mg/dL 17   Trlycerides <150 mg/dL 124   Hemoglobin A1c 4.8 - 5.6 % 5.7      Exercise Target Goals: Exercise Program Goal: Individual exercise prescription set using results from initial 6 min walk test and THRR while considering  patient's activity barriers and safety.   Exercise Prescription Goal: Initial exercise prescription builds to 30-45  minutes a day of aerobic activity, 2-3 days per week.  Home exercise guidelines will be given to patient during program as part of exercise prescription that the participant will acknowledge.   Education: Aerobic Exercise: - Group verbal and visual presentation on the components of exercise prescription. Introduces F.I.T.T principle from ACSM for exercise prescriptions.  Reviews F.I.T.T. principles of aerobic exercise including progression. Written material given at graduation.   Education: Resistance Exercise: - Group verbal and visual presentation on the components of exercise prescription. Introduces F.I.T.T principle from ACSM for exercise prescriptions  Reviews F.I.T.T. principles of resistance exercise including progression. Written material given at graduation.    Education: Exercise & Equipment Safety: - Individual verbal instruction and demonstration of equipment use and safety with use of the equipment. Flowsheet Row Cardiac Rehab from 03/09/2022 in [facility] Cardiac and Pulmonary Rehab  Date 01/03/22  Educator [place]  Instruction Review Code 1- Verbalizes Understanding       Education: Exercise Physiology & General Exercise Guidelines: - Group verbal and written instruction with models to review the exercise physiology of the cardiovascular system and associated critical values. Provides general exercise guidelines with specific guidelines to those with heart or lung disease.  Flowsheet Row Cardiac Rehab from 03/09/2022 in [facility] Cardiac and Pulmonary Rehab  Education need identified 02/02/22  Date 03/09/22  Educator [facility]  Instruction Review Code 1- Verbalizes Understanding  Education: Flexibility, Balance, Mind/Body Relaxation: - Group verbal and visual presentation with interactive activity on the components of exercise prescription. Introduces F.I.T.T principle from ACSM for exercise prescriptions. Reviews F.I.T.T. principles of flexibility and balance exercise training  including progression. Also discusses the mind body connection.  Reviews various relaxation techniques to help reduce and manage stress (i.e. Deep breathing, progressive muscle relaxation, and visualization). Balance handout provided to take home. Written material given at graduation.   Activity Barriers & Risk Stratification:  Activity Barriers & Cardiac Risk Stratification - 02/02/22 1104       Activity Barriers & Cardiac Risk Stratification   Activity Barriers Muscular Weakness    Cardiac Risk Stratification Moderate             6 Minute Walk:  6 Minute Walk     Row Name 02/02/22 1111         6 Minute Walk   Phase Initial     Distance 1410 feet     Walk Time 6 minutes     # of Rest Breaks 0     MPH 2.67     METS 3.57     RPE 9     Perceived Dyspnea  0     VO2 Peak 12.51     Symptoms No     Resting HR 62 bpm     Resting BP 126/70     Resting Oxygen Saturation  98 %     Exercise Oxygen Saturation  during 6 min walk 99 %     Max Ex. HR 92 bpm     Max Ex. BP 142/68     2 Minute Post BP 132/70              Oxygen Initial Assessment:   Oxygen Re-Evaluation:   Oxygen Discharge (Final Oxygen Re-Evaluation):   Initial Exercise Prescription:  Initial Exercise Prescription - 02/02/22 1100       Date of Initial Exercise RX and Referring Provider   Date 02/02/22    Referring Provider [name], [name]      Oxygen   Maintain Oxygen Saturation 88% or higher      Treadmill   MPH 2.7    Grade 0.5    Minutes 15    METs 3.25      NuStep   Level 3    SPM 80    Minutes 15    METs 3.5      REL-XR   Level 2    Speed 50    Minutes 15    METs 3.5      T5 Nustep   Level 2    SPM 80    Minutes 15    METs 3.5      Prescription Details   Frequency (times per week) 99 - 136    Duration Progress to 30 minutes of continuous aerobic without signs/symptoms of physical distress      Intensity   THRR 40-80% of Max Heartrate 99 - 136    Ratings  of Perceived Exertion 11-13    Perceived Dyspnea 0-4      Progression   Progression Continue to progress workloads to maintain intensity without signs/symptoms of physical distress.      Resistance Training   Training Prescription Yes    Weight 5 lb    Reps 10-15             Perform Capillary Blood Glucose checks as needed.  Exercise Prescription Changes:  Exercise Prescription Changes     Row Name 02/02/22 1100 02/23/22 0800 03/15/22 0900         Response to Exercise   Blood Pressure (Admit) 126/70 -- 112/60     Blood Pressure (Exercise) 142/68 -- --     Blood Pressure (Exit) 132/70 -- 124/62     Heart Rate (Admit) 62 bpm -- 80 bpm     Heart Rate (Exercise) 92 bpm -- 131 bpm     Heart Rate (Exit) 69 bpm -- 87 bpm     Oxygen Saturation (Admit) 98 % -- --     Oxygen Saturation (Exercise) 99 % -- --     Oxygen Saturation (Exit) 99 % -- --     Rating of Perceived Exertion (Exercise) 9 -- 13     Perceived Dyspnea (Exercise) 0 -- --     Symptoms none -- none     Comments walk test results -- --     Duration -- -- Continue with 30 min of aerobic exercise without signs/symptoms of physical distress.     Intensity -- -- THRR unchanged       Progression   Progression -- -- Continue to progress workloads to maintain intensity without signs/symptoms of physical distress.     Average METs -- -- 4.12       Resistance Training   Training Prescription -- -- Yes     Weight -- -- 5 lb     Reps -- -- 10-15       Interval Training   Interval Training -- -- No       Treadmill   MPH -- -- 3.3     Grade -- -- 7.5     Minutes -- -- 15     METs -- -- 6.9       Recumbant Bike   Level -- -- 1     Minutes -- -- 15     METs -- -- 2.91       NuStep   Level -- -- 5     Minutes -- -- 15     METs -- -- 4.2       T5 Nustep   Level -- -- 2     Minutes -- -- 15     METs -- -- 2.2       Home Exercise Plan   Plans to continue exercise at -- Home (comment)  walking, [facility] (comment)  walking, [facility]     Frequency -- Add 3 additional days to program exercise sessions. Add 3 additional days to program exercise sessions.     Initial Home Exercises Provided -- 02/23/22 02/23/22       Oxygen   Maintain Oxygen Saturation -- -- 88% or higher              Exercise Comments:   Exercise Comments     Row Name 02/14/22 0836           Exercise Comments First full day of exercise!  Patient was oriented to gym and equipment including functions, settings, policies, and procedures.  Patient's individual exercise prescription and treatment plan were reviewed.  All starting workloads were established based on the results of the 6 minute walk test done at initial orientation visit.  The plan for exercise progression was also introduced and progression will be customized based on patient's performance and goals.                Exercise  Goals and Review:   Exercise Goals     Row Name 02/02/22 1114             Exercise Goals   Increase Physical Activity Yes       Intervention Develop an individualized exercise prescription for aerobic and resistive training based on initial evaluation findings, risk stratification, comorbidities and participant's personal goals.;Provide advice, education, support and counseling about physical activity/exercise needs.       Expected Outcomes Short Term: Attend rehab on a regular basis to increase amount of physical activity.;Long Term: Exercising regularly at least 3-5 days a week.;Long Term: Add in home exercise to make exercise part of routine and to increase amount of physical activity.       Increase Strength and Stamina Yes       Intervention Provide advice, education, support and counseling about physical activity/exercise needs.;Develop an individualized exercise prescription for aerobic and resistive training based on initial evaluation findings, risk stratification, comorbidities and participant's personal goals.        Expected Outcomes Short Term: Increase workloads from initial exercise prescription for resistance, speed, and METs.;Short Term: Perform resistance training exercises routinely during rehab and add in resistance training at home;Long Term: Improve cardiorespiratory fitness, muscular endurance and strength as measured by increased METs and functional capacity (6MWT)       Able to understand and use rate of perceived exertion (RPE) scale Yes       Intervention Provide education and explanation on how to use RPE scale       Expected Outcomes Short Term: Able to use RPE daily in rehab to express subjective intensity level;Long Term:  Able to use RPE to guide intensity level when exercising independently       Able to understand and use Dyspnea scale Yes       Intervention Provide education and explanation on how to use Dyspnea scale       Expected Outcomes Short Term: Able to use Dyspnea scale daily in rehab to express subjective sense of shortness of breath during exertion;Long Term: Able to use Dyspnea scale to guide intensity level when exercising independently       Knowledge and understanding of Target Heart Rate Range (THRR) Yes       Intervention Provide education and explanation of THRR including how the numbers were predicted and where they are located for reference       Expected Outcomes Short Term: Able to state/look up THRR;Long Term: Able to use THRR to govern intensity when exercising independently;Short Term: Able to use daily as guideline for intensity in rehab       Able to check pulse independently Yes       Intervention Provide education and demonstration on how to check pulse in carotid and radial arteries.;Review the importance of being able to check your own pulse for safety during independent exercise       Expected Outcomes Long Term: Able to check pulse independently and accurately;Short Term: Able to explain why pulse checking is important during independent exercise        Understanding of Exercise Prescription Yes       Intervention Provide education, explanation, and written materials on patient's individual exercise prescription       Expected Outcomes Short Term: Able to explain program exercise prescription;Long Term: Able to explain home exercise prescription to exercise independently                Exercise Goals Re-Evaluation :  Exercise  Goals Re-Evaluation     Row Name 02/14/22 [phone] 02/23/22 0856 03/15/22 0948         Exercise Goal Re-Evaluation   Exercise Goals Review Able to understand and use rate of perceived exertion (RPE) scale;Knowledge and understanding of Target Heart Rate Range (THRR);Understanding of Exercise Prescription;Able to understand and use Dyspnea scale Able to understand and use rate of perceived exertion (RPE) scale;Knowledge and understanding of Target Heart Rate Range (THRR);Understanding of Exercise Prescription;Able to understand and use Dyspnea scale;Increase Physical Activity;Increase Strength and Stamina;Able to check pulse independently Increase Physical Activity;Increase Strength and Stamina;Understanding of Exercise Prescription     Comments Reviewed RPE and dyspnea scales, THR and program prescription with pt today.  Pt voiced understanding and was given a copy of goals to take home. Rajvir is off to a good start in rehab.  We talked about needing more than just three days a week.  Reviewed home exercise with pt today.  Pt plans to walk and go back to [facility] for exercise.  Reviewed THR, pulse, RPE, sign and symptoms, pulse oximetery and when to call 911 or [name].  Also discussed weather considerations and indoor options.  Pt voiced understanding. Naquan is doing well since he started rehab. He has already increased his workloads on his machines quite a bit. He is walking at a 3.3 mph/ 7.5% incline on the treadmill, almost at 7 METS! He has alrso increased to level 5 on the T4 Nustep. It appears he tried the recumbent bike  at level 1, and shuld up his workload on it for next time. He is hitting his THR each session. We will continue to monitor.     Expected Outcomes Short: Use RPE daily to regulate intensity. Long: Follow program prescription in THR. Short: Start to add in more exercise at home Long: Conitnue to improve stamina Short: Increase workload on recumbent bike Long: Continue to increase overall MET level              Discharge Exercise Prescription (Final Exercise Prescription Changes):  Exercise Prescription Changes - 03/15/22 0900       Response to Exercise   Blood Pressure (Admit) 112/60    Blood Pressure (Exit) 124/62    Heart Rate (Admit) 80 bpm    Heart Rate (Exercise) 131 bpm    Heart Rate (Exit) 87 bpm    Rating of Perceived Exertion (Exercise) 13    Symptoms none    Duration Continue with 30 min of aerobic exercise without signs/symptoms of physical distress.    Intensity THRR unchanged      Progression   Progression Continue to progress workloads to maintain intensity without signs/symptoms of physical distress.    Average METs 4.12      Resistance Training   Training Prescription Yes    Weight 5 lb    Reps 10-15      Interval Training   Interval Training No      Treadmill   MPH 3.3    Grade 7.5    Minutes 15    METs 6.9      Recumbant Bike   Level 1    Minutes 15    METs 2.91      NuStep   Level 5    Minutes 15    METs 4.2      T5 Nustep   Level 2    Minutes 15    METs 2.2      Home Exercise Plan   Plans to  continue exercise at Home (comment)   walking, [employer]   Frequency Add 3 additional days to program exercise sessions.    Initial Home Exercises Provided 02/23/22      Oxygen   Maintain Oxygen Saturation 88% or higher             Nutrition:  Target Goals: Understanding of nutrition guidelines, daily intake of sodium <1556m, cholesterol <2061m calories 30% from fat and 7% or less from saturated fats, daily to have 5 or more servings of  fruits and vegetables.  Education: All About Nutrition: -Group instruction provided by verbal, written material, interactive activities, discussions, models, and posters to present general guidelines for heart healthy nutrition including fat, fiber, MyPlate, the role of sodium in heart healthy nutrition, utilization of the nutrition label, and utilization of this knowledge for meal planning. Follow up email sent as well. Written material given at graduation.   Biometrics:  Pre Biometrics - 02/02/22 1106       Pre Biometrics   Height 5' 11.5" (1.816 m)    Weight 184 lb 3.2 oz (83.6 kg)   Was ~ 168 lb 1 month ago, confirmed with patient.   BMI (Calculated) 25.34    Single Leg Stand 22.4 seconds              Nutrition Therapy Plan and Nutrition Goals:  Nutrition Therapy & Goals - 02/28/22 0845       Nutrition Therapy   Diet Heart healthy, low Na    Protein (specify units) 65g    Fiber 30 grams    Whole Grain Foods 3 servings    Saturated Fats 16 max. grams    Fruits and Vegetables 8 servings/day    Sodium 2 grams      Personal Nutrition Goals   Nutrition Goal ST: practice MyPlate structure  LT: follow MyPlate guidelines, eat at least 5 fruit/vegetable servings per day, limit saturated fat <16g/day    Comments [age].o. M admitted to cardiac rehab s/p aortic valve replacement. PMHx inlcudes bicuspid aortic valve, HTN, CKD, HLD. Relevant medications includes MVI with minerals. PYP Score: 51.  Vegetables & Fruits 5/12. Breads, Grains & Cereals 9/12. Red & Processed Meat 10/12. Poultry 0/2. Fish & Shellfish 1/4. Beans, Nuts & Seeds 2/4. Milk & Dairy Foods 3/6. Toppings, Oils, Seasonings & Salt 12/20. Sweets, Snacks & Restaurant Food 5/14. Beverages 4/10.  They report going out to eat about 3x/week. B: bacon, eggs, toast (whole wheat), grits (butter, splenda) or sometimes oatmeal (butter). S: chips, cookie, cake, fruit, etc. L: vaires: baked chicken with vegetables, fried chicken,  sandwiches from subway, burgers, fish. S: popcorn, fruit, etc. D: same as lunch ( they will only fry fish, baked chicken in oven or airfyer, fajitas, tomato sandwich, banana sandwich, variety of vegetables (green)). They use vegetable, canola, and olive oil. He reports he will use seasonings which can include salt as well as salt. Discussed general heart healthy eating, [place] nervous that he will not be able to structure his meals like the MyPlate guideline; discussed trying to add in more nutrient dense foods to meals and be mindful of MyPlate strucutre.      Intervention Plan   Intervention Prescribe, educate and counsel regarding individualized specific dietary modifications aiming towards targeted core components such as weight, hypertension, lipid management, diabetes, heart failure and other comorbidities.    Expected Outcomes Short Term Goal: Understand basic principles of dietary content, such as calories, fat, sodium, cholesterol and nutrients.;Short Term Goal: A  plan has been developed with personal nutrition goals set during dietitian appointment.;Long Term Goal: Adherence to prescribed nutrition plan.             Nutrition Assessments:  MEDIFICTS Score Key: ?70 Need to make dietary changes  40-70 Heart Healthy Diet ? 40 Therapeutic Level Cholesterol Diet  Flowsheet Row Cardiac Rehab from 02/02/2022 in [facility] Cardiac and Pulmonary Rehab  Picture Your Plate Total Score on Admission 51      Picture Your Plate Scores: <71 Unhealthy dietary pattern with much room for improvement. 41-50 Dietary pattern unlikely to meet recommendations for good health and room for improvement. 51-60 More healthful dietary pattern, with some room for improvement.  >60 Healthy dietary pattern, although there may be some specific behaviors that could be improved.    Nutrition Goals Re-Evaluation:  Nutrition Goals Re-Evaluation     [place] Name 02/23/22 0825             Goals   Nutrition Goal  Meet with [facility] Monday       Comment Meet with [facility] Monday       Expected Outcome Meet with [facility] Monday                Nutrition Goals Discharge (Final Nutrition Goals Re-Evaluation):  Nutrition Goals Re-Evaluation - 02/23/22 0825       Goals   Nutrition Goal Meet with [facility] Monday    Comment Meet with [facility] Monday    Expected Outcome Meet with [facility] Monday             Psychosocial: Target Goals: Acknowledge presence or absence of significant depression and/or stress, maximize coping skills, provide positive support system. Participant is able to verbalize types and ability to use techniques and skills needed for reducing stress and depression.   Education: Stress, Anxiety, and Depression - Group verbal and visual presentation to define topics covered.  Reviews how body is impacted by stress, anxiety, and depression.  Also discusses healthy ways to reduce stress and to treat/manage anxiety and depression.  Written material given at graduation. Flowsheet Row Cardiac Rehab from 03/09/2022 in [facility] Cardiac and Pulmonary Rehab  Date 03/02/22  Educator [facility]  Instruction Review Code 1- [employer] Understanding       Education: Sleep Hygiene -Provides group verbal and written instruction about how sleep can affect your health.  Define sleep hygiene, discuss sleep cycles and impact of sleep habits. Review good sleep hygiene tips.    Initial Review & Psychosocial Screening:  Initial Psych Review & Screening - 01/03/22 0958       Initial Review   Current issues with None Identified      Family Dynamics   Good Support System? Yes    Comments He can look to his wife for support. He has not done a cardiac rehab program before. He can also look to his daughter and four grandkids for support.      Barriers   Psychosocial barriers to participate in program The patient should benefit from training in stress management and relaxation.;There are no identifiable barriers  or psychosocial needs.      Screening Interventions   Interventions Encouraged to exercise;To provide support and resources with identified psychosocial needs;Provide feedback about the scores to participant    Expected Outcomes Short Term goal: Utilizing psychosocial counselor, staff and physician to assist with identification of specific Stressors or current issues interfering with healing process. Setting desired goal for each stressor or current issue identified.;Long Term Goal: Stressors or  current issues are controlled or eliminated.;Short Term goal: Identification and review with participant of any Quality of Life or Depression concerns found by scoring the questionnaire.;Long Term goal: The participant improves quality of Life and PHQ9 Scores as seen by post scores and/or verbalization of changes             Quality of Life Scores:   Quality of Life - 02/02/22 0945       Quality of Life   Select Quality of Life      Quality of Life Scores   Health/Function Pre 24.4 %    Socioeconomic Pre 28.13 %    Psych/Spiritual Pre 30 %    Family Pre 30 %    GLOBAL Pre 27.17 %            Scores of 19 and below usually indicate a poorer quality of life in these areas.  A difference of  2-3 points is a clinically meaningful difference.  A difference of 2-3 points in the total score of the Quality of Life Index has been associated with significant improvement in overall quality of life, self-image, physical symptoms, and general health in studies assessing change in quality of life.  PHQ-9: Review Flowsheet       02/02/2022  Depression screen PHQ 2/9  Decreased Interest 0  Down, Depressed, Hopeless 0  PHQ - 2 Score 0  Altered sleeping 0  Tired, decreased energy 0  Change in appetite 0  Feeling bad or failure about yourself  0  Trouble concentrating 0  Moving slowly or fidgety/restless 0  Suicidal thoughts 0  PHQ-9 Score 0  Difficult doing work/chores Not difficult at all    Interpretation of Total Score  Total Score Depression Severity:  1-4 = Minimal depression, 5-9 = Mild depression, 10-14 = Moderate depression, 15-19 = Moderately severe depression, 20-27 = Severe depression   Psychosocial Evaluation and Intervention:  Psychosocial Evaluation - 01/03/22 0959       Psychosocial Evaluation & Interventions   Interventions Encouraged to exercise with the program and follow exercise prescription;Relaxation education;Stress management education    Comments He can look to his wife for support. He has not done a cardiac rehab program before. He can also look to his daughter and four grandkids for support.    Expected Outcomes Short: Start [facility] to help with mood. Long: Maintain a healthy mental state    Continue Psychosocial Services  Follow up required by staff             Psychosocial Re-Evaluation:  Psychosocial Re-Evaluation     [place] Name 02/23/22 0824             Psychosocial Re-Evaluation   Comments Traci is off to a good start in rehab.  His wife is his biggest supporter and usually goes to gym while he is in class so they can exercise together.  He denies any major stressors currently.  He sleeps well for most part, but does need to get up to go to bathroom during the night on occassion.       Expected Outcomes Short: Conitnue to exercise for mental boost Long: continue to stay positive       Interventions Encouraged to attend Cardiac Rehabilitation for the exercise       Continue Psychosocial Services  Follow up required by staff                Psychosocial Discharge (Final Psychosocial Re-Evaluation):  Psychosocial Re-Evaluation - 02/23/22 3976  Psychosocial Re-Evaluation   Comments Duard is off to a good start in rehab.  His wife is his biggest supporter and usually goes to gym while he is in class so they can exercise together.  He denies any major stressors currently.  He sleeps well for most part, but does need to  get up to go to bathroom during the night on occassion.    Expected Outcomes Short: Conitnue to exercise for mental boost Long: continue to stay positive    Interventions Encouraged to attend Cardiac Rehabilitation for the exercise    Continue Psychosocial Services  Follow up required by staff             Vocational Rehabilitation: Provide vocational rehab assistance to qualifying candidates.   Vocational Rehab Evaluation & Intervention:   Education: Education Goals: Education classes will be provided on a variety of topics geared toward better understanding of heart health and risk factor modification. Participant will state understanding/return demonstration of topics presented as noted by education test scores.  Learning Barriers/Preferences:  Learning Barriers/Preferences - 01/03/22 0956       Learning Barriers/Preferences   Learning Barriers None    Learning Preferences None             General Cardiac Education Topics:  AED/CPR: - Group verbal and written instruction with the use of models to demonstrate the basic use of the AED with the basic ABC's of resuscitation.   Anatomy and Cardiac Procedures: - Group verbal and visual presentation and models provide information about basic cardiac anatomy and function. Reviews the testing methods done to diagnose heart disease and the outcomes of the test results. Describes the treatment choices: Medical Management, Angioplasty, or Coronary Bypass Surgery for treating various heart conditions including Myocardial Infarction, Angina, Valve Disease, and Cardiac Arrhythmias.  Written material given at graduation.   Medication Safety: - Group verbal and visual instruction to review commonly prescribed medications for heart and lung disease. Reviews the medication, class of the drug, and side effects. Includes the steps to properly store meds and maintain the prescription regimen.  Written material given at graduation. Flowsheet  Row Cardiac Rehab from 03/09/2022 in [facility] Cardiac and Pulmonary Rehab  Education need identified 02/02/22       Intimacy: - Group verbal instruction through game format to discuss how heart and lung disease can affect sexual intimacy. Written material given at graduation..   Know Your Numbers and Heart Failure: - Group verbal and visual instruction to discuss disease risk factors for cardiac and pulmonary disease and treatment options.  Reviews associated critical values for Overweight/Obesity, Hypertension, Cholesterol, and Diabetes.  Discusses basics of heart failure: signs/symptoms and treatments.  Introduces Heart Failure Zone chart for action plan for heart failure.  Written material given at graduation. Flowsheet Row Cardiac Rehab from 03/09/2022 in [facility] Cardiac and Pulmonary Rehab  Date 02/16/22  Educator SB  Instruction Review Code 1- Verbalizes Understanding       Infection Prevention: - Provides verbal and written material to individual with discussion of infection control including proper hand washing and proper equipment cleaning during exercise session. Flowsheet Row Cardiac Rehab from 03/09/2022 in [facility] Cardiac and Pulmonary Rehab  Date 01/03/22  Educator [facility]  Instruction Review Code 1- Verbalizes Understanding       Falls Prevention: - Provides verbal and written material to individual with discussion of falls prevention and safety. Flowsheet Row Cardiac Rehab from 03/09/2022 in [facility] Cardiac and Pulmonary Rehab  Date 01/03/22  Educator [facility]  Instruction Review Code 1- Verbalizes Understanding       Other: -Provides group and verbal instruction on various topics (see comments)   Knowledge Questionnaire Score:  Knowledge Questionnaire Score - 02/02/22 0945       Knowledge Questionnaire Score   Pre Score 24/26             Core Components/Risk Factors/Patient Goals at Admission:  Personal Goals and Risk Factors at Admission - 02/02/22 1115       Core  Components/Risk Factors/Patient Goals on Admission    Weight Management Yes;Weight Maintenance   Used to be 200lb, lost weight to ~168, gained up to 184 lb the last months   Intervention Weight Management: Develop a combined nutrition and exercise program designed to reach desired caloric intake, while maintaining appropriate intake of nutrient and fiber, sodium and fats, and appropriate energy expenditure required for the weight goal.;Weight Management: Provide education and appropriate resources to help participant work on and attain dietary goals.;Weight Management/Obesity: Establish reasonable short term and long term weight goals.    Admit Weight 184 lb (83.5 kg)    Goal Weight: Short Term 184 lb (83.5 kg)    Goal Weight: Long Term 184 lb (83.5 kg)    Expected Outcomes Short Term: Continue to assess and modify interventions until short term weight is achieved;Long Term: Adherence to nutrition and physical activity/exercise program aimed toward attainment of established weight goal;Weight Maintenance: Understanding of the daily nutrition guidelines, which includes 25-35% calories from fat, 7% or less cal from saturated fats, less than 267m cholesterol, less than 1.5gm of sodium, & 5 or more servings of fruits and vegetables daily;Understanding recommendations for meals to include 15-35% energy as protein, 25-35% energy from fat, 35-60% energy from carbohydrates, less than 2067mof dietary cholesterol, 20-35 gm of total fiber daily;Understanding of distribution of calorie intake throughout the day with the consumption of 4-5 meals/snacks    Tobacco Cessation Yes    Number of packs per day 0. Quit on 10/10/21    Intervention Assist the participant in steps to quit. Provide individualized education and counseling about committing to Tobacco Cessation, relapse prevention, and pharmacological support that can be provided by physician.;OfAdvice workerassist with locating and accessing  local/national Quit Smoking programs, and support quit date choice.    Expected Outcomes Short Term: Will demonstrate readiness to quit, by selecting a quit date.;Short Term: Will quit all tobacco product use, adhering to prevention of relapse plan.;Long Term: Complete abstinence from all tobacco products for at least 12 months from quit date.    Hypertension Yes    Intervention Provide education on lifestyle modifcations including regular physical activity/exercise, weight management, moderate sodium restriction and increased consumption of fresh fruit, vegetables, and low fat dairy, alcohol moderation, and smoking cessation.;Monitor prescription use compliance.    Expected Outcomes Short Term: Continued assessment and intervention until BP is < 140/9041mG in hypertensive participants. < 130/61m64m in hypertensive participants with diabetes, heart failure or chronic kidney disease.;Long Term: Maintenance of blood pressure at goal levels.    Lipids Yes    Intervention Provide education and support for participant on nutrition & aerobic/resistive exercise along with prescribed medications to achieve LDL <70mg3mL >40mg.56mExpected Outcomes Short Term: Participant states understanding of desired cholesterol values and is compliant with medications prescribed. Participant is following exercise prescription and nutrition guidelines.;Long Term: Cholesterol controlled with medications as prescribed, with individualized exercise RX and with personalized nutrition plan. Value goals: LDL <  74m, HDL > 40 mg.             Education:Diabetes - Individual verbal and written instruction to review signs/symptoms of diabetes, desired ranges of glucose level fasting, after meals and with exercise. Acknowledge that pre and post exercise glucose checks will be done for 3 sessions at entry of program.   Core Components/Risk Factors/Patient Goals Review:   Goals and Risk Factor Review     Row Name 02/23/22 0904              Core Components/Risk Factors/Patient Goals Review   Personal Goals Review Weight Management/Obesity;Hypertension;Lipids;Tobacco Cessation       Review WBaneis off to a good start in rehab. He is trying to gain weight by building muscle back up.  He is up some but was encouraged to make sure he is eating protein.  He has an appointment on Monday with dietitian.  He is still not smoking.  Blood pressures are doing well and he continues to check them at home.       Expected Outcomes Short: Continue to work on muscle gain Long: Continue to monitor risk factors                Core Components/Risk Factors/Patient Goals at Discharge (Final Review):   Goals and Risk Factor Review - 02/23/22 0904       Core Components/Risk Factors/Patient Goals Review   Personal Goals Review Weight Management/Obesity;Hypertension;Lipids;Tobacco Cessation    Review WAkeemis off to a good start in rehab. He is trying to gain weight by building muscle back up.  He is up some but was encouraged to make sure he is eating protein.  He has an appointment on Monday with dietitian.  He is still not smoking.  Blood pressures are doing well and he continues to check them at home.    Expected Outcomes Short: Continue to work on muscle gain Long: Continue to monitor risk factors             ITP Comments:  ITP Comments     Row Name 01/03/22 0955 02/02/22 0943 02/14/22 0836 02/16/22 0636 02/28/22 0954   ITP Comments Virtual Visit completed. Patient informed on EP and RD appointment and 6 Minute walk test. Patient also informed of patient health questionnaires on My Chart. Patient Verbalizes understanding. [facility] Completed 6MWT and gym orientation. Initial ITP created and sent for review to Dr. MAvelina Laine Medical Director. First full day of exercise!  Patient was oriented to gym and equipment including functions, settings, policies, and procedures.  Patient's  individual exercise prescription and treatment plan were reviewed.  All starting workloads were established based on the results of the 6 minute walk test done at initial orientation visit.  The plan for exercise progression was also introduced and progression will be customized based on patient's performance and goals. 30 Day review completed. Medical Director ITP review done, changes made as directed, and signed approval by Medical Director.    NEW Completed initial RD consultation    RGlen [place] ParkName 03/16/22 0725           ITP Comments 30 Day review completed. Medical Director ITP review done, changes made as directed, and signed approval by Medical Director.                Comments:

## 2022-03-16 NOTE — Progress Notes (Signed)
Daily Session Note  Patient Details  Name: [name] MRN: [id number] Date of Birth: 1955-07-23 Referring Provider:   Flowsheet Row Cardiac Rehab from 02/02/2022 in [facility] Cardiac and Pulmonary Rehab  Referring Provider Isaias Cowman MD       Encounter Date: 03/16/2022  Check In:  Session Check In - 03/16/22 0816       Check-In   Supervising physician immediately available to respond to emergencies See telemetry face sheet for immediately available ER MD    Location [facility]-Cardiac & Pulmonary Rehab    Staff Present Heath Lark, RN, BSN, CCRP;Joseph [place], RCP,RRT,BSRT;Jessica [place], [place], RCEP, CCRP, CCET;Noah Tickle, [place], Exercise Physiologist    Virtual Visit No    Medication changes reported     No    Warm-up and Cool-down Performed on first and last piece of equipment    Resistance Training Performed Yes    VAD Patient? No    PAD/SET Patient? No      Pain Assessment   Currently in Pain? No/denies                Social History   Tobacco Use  Smoking Status Former   Packs/day: 1.00   Years: 46.00   Total pack years: 46.00   Types: Cigarettes   Quit date: 10/09/2021   Years since quitting: 0.4  Smokeless Tobacco Never    Goals Met:  Independence with exercise equipment Exercise tolerated well No report of concerns or symptoms today  Goals Unmet:  Not Applicable  Comments: Pt able to follow exercise prescription today without complaint.  Will continue to monitor for progression.    Dr. Emily Filbert is Medical Director for [place].  Dr. Ottie Glazier is Medical Director for [facility] Pulmonary Rehabilitation.

## 2022-03-18 ENCOUNTER — Encounter: Payer: PPO | Admitting: *Deleted

## 2022-03-18 DIAGNOSIS — Z48812 Encounter for surgical aftercare following surgery on the circulatory system: Secondary | ICD-10-CM | POA: Diagnosis not present

## 2022-03-18 DIAGNOSIS — Z952 Presence of prosthetic heart valve: Secondary | ICD-10-CM

## 2022-03-18 NOTE — Progress Notes (Signed)
Daily Session Note  Patient Details  Name: [name] MRN: [id number] Date of Birth: 04-12-1956 Referring Provider:   Flowsheet Row Cardiac Rehab from 02/02/2022 in [facility] Cardiac and Pulmonary Rehab  Referring Provider Isaias Cowman MD       Encounter Date: 03/18/2022  Check In:  Session Check In - 03/18/22 0916       Check-In   Supervising physician immediately available to respond to emergencies See telemetry face sheet for immediately available ER MD    Location [facility]-Cardiac & Pulmonary Rehab    Staff Present Heath Lark, RN, BSN, CCRP;Joseph [place], [place], [place], [place], CCRP, CCET    Virtual Visit No    Medication changes reported     No    Fall or balance concerns reported    No    Current number of cigarettes/nicotine per day     0    Warm-up and Cool-down Performed on first and last piece of equipment    Resistance Training Performed Yes    VAD Patient? No    PAD/SET Patient? No      Pain Assessment   Currently in Pain? No/denies                Social History   Tobacco Use  Smoking Status Former   Packs/day: 1.00   Years: 46.00   Total pack years: 46.00   Types: Cigarettes   Quit date: 10/09/2021   Years since quitting: 0.4  Smokeless Tobacco Never    Goals Met:  Independence with exercise equipment Exercise tolerated well No report of concerns or symptoms today  Goals Unmet:  Not Applicable  Comments: Pt able to follow exercise prescription today without complaint.  Will continue to monitor for progression.    Dr. Emily Filbert is Medical Director for [place].  Dr. Ottie Glazier is Medical Director for [facility] Pulmonary Rehabilitation.

## 2022-03-21 ENCOUNTER — Encounter: Payer: PPO | Admitting: *Deleted

## 2022-03-21 DIAGNOSIS — Z48812 Encounter for surgical aftercare following surgery on the circulatory system: Secondary | ICD-10-CM | POA: Diagnosis not present

## 2022-03-21 DIAGNOSIS — Z952 Presence of prosthetic heart valve: Secondary | ICD-10-CM

## 2022-03-21 NOTE — Progress Notes (Signed)
Daily Session Note  Patient Details  Name: [name] MRN: [id number] Date of Birth: 01/28/1956 Referring Provider:   Flowsheet Row Cardiac Rehab from 02/02/2022 in [facility] Cardiac and Pulmonary Rehab  Referring Provider Isaias Cowman MD       Encounter Date: 03/21/2022  Check In:  Session Check In - 03/21/22 1023       Check-In   Supervising physician immediately available to respond to emergencies See telemetry face sheet for immediately available ER MD    Location [facility]-Cardiac & Pulmonary Rehab    Staff Present Heath Lark, RN, BSN, CCRP;Joseph [place], RCP,RRT,BSRT;Kelly [place]ora, BS, ACSM CEP, Exercise Physiologist    Virtual Visit No    Medication changes reported     No    Fall or balance concerns reported    No    Warm-up and Cool-down Performed on first and last piece of equipment    Resistance Training Performed Yes    VAD Patient? No    PAD/SET Patient? No      Pain Assessment   Currently in Pain? No/denies                Social History   Tobacco Use  Smoking Status Former   Packs/day: 1.00   Years: 46.00   Total pack years: 46.00   Types: Cigarettes   Quit date: 10/09/2021   Years since quitting: 0.4  Smokeless Tobacco Never    Goals Met:  Independence with exercise equipment Exercise tolerated well No report of concerns or symptoms today  Goals Unmet:  Not Applicable  Comments: Pt able to follow exercise prescription today without complaint.  Will continue to monitor for progression.    Dr. Emily Filbert is Medical Director for [place].  Dr. Ottie Glazier is Medical Director for [facility] Pulmonary Rehabilitation.

## 2022-03-23 ENCOUNTER — Encounter: Payer: PPO | Admitting: *Deleted

## 2022-03-23 DIAGNOSIS — Z48812 Encounter for surgical aftercare following surgery on the circulatory system: Secondary | ICD-10-CM | POA: Diagnosis not present

## 2022-03-23 DIAGNOSIS — Z952 Presence of prosthetic heart valve: Secondary | ICD-10-CM

## 2022-03-23 NOTE — Progress Notes (Signed)
Daily Session Note  Patient Details  Name: [name] MRN: [id number] Date of Birth: [birth year] Referring Provider:   Flowsheet Row Cardiac Rehab from 02/02/2022 in [facility] Cardiac and Pulmonary Rehab  Referring Provider Isaias Cowman MD       Encounter Date: 03/23/2022  Check In:  Session Check In - 03/23/22 0830       Check-In   Supervising physician immediately available to respond to emergencies See telemetry face sheet for immediately available ER MD    Location [facility]-Cardiac & Pulmonary Rehab    Staff Present Heath Lark, RN, BSN, CCRP;Other   Kathee Delton, BS, Exercise Physiology   Virtual Visit Yes    Medication changes reported     No    Fall or balance concerns reported    No    Warm-up and Cool-down Performed on first and last piece of equipment    Resistance Training Performed Yes    VAD Patient? No    PAD/SET Patient? No      Pain Assessment   Currently in Pain? No/denies                Social History   Tobacco Use  Smoking Status Former   Packs/day: 1.00   Years: 46.00   Total pack years: 46.00   Types: Cigarettes   Quit date: 10/09/2021   Years since quitting: 0.4  Smokeless Tobacco Never    Goals Met:  Independence with exercise equipment Exercise tolerated well No report of concerns or symptoms today  Goals Unmet:  Not Applicable  Comments: Pt able to follow exercise prescription today without complaint.  Will continue to monitor for progression.    Dr. Emily Filbert is Medical Director for [place].  Dr. Ottie Glazier is Medical Director for [facility] Pulmonary Rehabilitation.

## 2022-03-25 ENCOUNTER — Encounter: Payer: PPO | Admitting: *Deleted

## 2022-03-25 DIAGNOSIS — Z48812 Encounter for surgical aftercare following surgery on the circulatory system: Secondary | ICD-10-CM | POA: Diagnosis not present

## 2022-03-25 DIAGNOSIS — Z952 Presence of prosthetic heart valve: Secondary | ICD-10-CM

## 2022-03-25 NOTE — Progress Notes (Signed)
Daily Session Note  Patient Details  Name: Savas Sainato MRN: [id number] Date of Birth: 10/13/1955 Referring Provider:   Flowsheet Row Cardiac Rehab from 02/02/2022 in [facility] Cardiac and Pulmonary Rehab  Referring Provider Paraschos, Alexander MD       Encounter Date: 03/25/2022  Check In:  Session Check In - 03/25/22 0822       Check-In   Supervising physician immediately available to respond to emergencies See telemetry face sheet for immediately available ER MD    Location [facility]-Cardiac & Pulmonary Rehab    Staff Present Susanne Bice, RN, BSN, CCRP;Joseph [place], RCP,RRT,BSRT    Virtual Visit No    Medication changes reported     No    Fall or balance concerns reported    No    Warm-up and Cool-down Performed on first and last piece of equipment    Resistance Training Performed Yes    VAD Patient? No    PAD/SET Patient? No      Pain Assessment   Currently in Pain? No/denies                Social History   Tobacco Use  Smoking Status Former   Packs/day: 1.00   Years: 46.00   Total pack years: 46.00   Types: Cigarettes   Quit date: 10/09/2021   Years since quitting: 0.4  Smokeless Tobacco Never    Goals Met:  Independence with exercise equipment Exercise tolerated well No report of concerns or symptoms today  Goals Unmet:  Not Applicable  Comments: Pt able to follow exercise prescription today without complaint.  Will continue to monitor for progression.    Dr. Mark Miller is Medical Director for [facility] Cardiac [facility].  Dr. Fuad Aleskerov is Medical Director for [facility] Pulmonary Rehabilitation. 

## 2022-03-29 ENCOUNTER — Ambulatory Visit: Payer: PPO | Attending: Infectious Diseases

## 2022-03-29 DIAGNOSIS — R42 Dizziness and giddiness: Secondary | ICD-10-CM | POA: Insufficient documentation

## 2022-03-30 ENCOUNTER — Encounter: Payer: PPO | Admitting: *Deleted

## 2022-03-30 DIAGNOSIS — Z95828 Presence of other vascular implants and grafts: Secondary | ICD-10-CM | POA: Diagnosis not present

## 2022-03-30 DIAGNOSIS — Z720 Tobacco use: Secondary | ICD-10-CM | POA: Diagnosis not present

## 2022-03-30 DIAGNOSIS — I10 Essential (primary) hypertension: Secondary | ICD-10-CM | POA: Diagnosis not present

## 2022-03-30 DIAGNOSIS — E785 Hyperlipidemia, unspecified: Secondary | ICD-10-CM | POA: Diagnosis not present

## 2022-03-30 DIAGNOSIS — Z952 Presence of prosthetic heart valve: Secondary | ICD-10-CM

## 2022-03-30 DIAGNOSIS — I6381 Other cerebral infarction due to occlusion or stenosis of small artery: Secondary | ICD-10-CM | POA: Diagnosis not present

## 2022-03-30 DIAGNOSIS — Z23 Encounter for immunization: Secondary | ICD-10-CM | POA: Diagnosis not present

## 2022-03-30 DIAGNOSIS — Z48812 Encounter for surgical aftercare following surgery on the circulatory system: Secondary | ICD-10-CM | POA: Diagnosis not present

## 2022-03-30 DIAGNOSIS — I351 Nonrheumatic aortic (valve) insufficiency: Secondary | ICD-10-CM | POA: Diagnosis not present

## 2022-03-30 DIAGNOSIS — F1490 Cocaine use, unspecified, uncomplicated: Secondary | ICD-10-CM | POA: Diagnosis not present

## 2022-03-30 DIAGNOSIS — I358 Other nonrheumatic aortic valve disorders: Secondary | ICD-10-CM | POA: Diagnosis not present

## 2022-03-30 NOTE — Progress Notes (Signed)
Daily Session Note  Patient Details  Name: Armarion Greek MRN: [id number] Date of Birth: 04-Jun-1956 Referring Provider:   Flowsheet Row Cardiac Rehab from 02/02/2022 in [facility] Cardiac and Pulmonary Rehab  Referring Provider Isaias Cowman MD       Encounter Date: 03/30/2022  Check In:  Session Check In - 03/30/22 0815       Check-In   Supervising physician immediately available to respond to emergencies See telemetry face sheet for immediately available ER MD    Location [facility]-Cardiac & Pulmonary Rehab    Staff Present Heath Lark, RN, BSN, CCRP;Joseph [place], RCP,RRT,BSRT;Noah Tickle, [place], Exercise Physiologist;Other   Darlyne Russian RN   Virtual Visit No    Medication changes reported     No    Fall or balance concerns reported    No    Warm-up and Cool-down Performed on first and last piece of equipment    Resistance Training Performed Yes    VAD Patient? No    PAD/SET Patient? No      Pain Assessment   Currently in Pain? No/denies                Social History   Tobacco Use  Smoking Status Former   Packs/day: 1.00   Years: 46.00   Total pack years: 46.00   Types: Cigarettes   Quit date: 10/09/2021   Years since quitting: 0.4  Smokeless Tobacco Never    Goals Met:  Proper associated with RPD/PD & O2 Sat Independence with exercise equipment Exercise tolerated well No report of concerns or symptoms today Strength training completed today  Goals Unmet:  Not Applicable  Comments: Pt able to follow exercise prescription today without complaint.  Will continue to monitor for progression.    Dr. Emily Filbert is Medical Director for [place].  Dr. Ottie Glazier is Medical Director for [facility] Pulmonary Rehabilitation.

## 2022-03-31 DIAGNOSIS — H4389 Other disorders of vitreous body: Secondary | ICD-10-CM | POA: Diagnosis not present

## 2022-03-31 DIAGNOSIS — B49 Unspecified mycosis: Secondary | ICD-10-CM | POA: Diagnosis not present

## 2022-03-31 DIAGNOSIS — H44431 Hypotony of eye due to other ocular disorders, right eye: Secondary | ICD-10-CM | POA: Diagnosis not present

## 2022-03-31 DIAGNOSIS — H5461 Unqualified visual loss, right eye, normal vision left eye: Secondary | ICD-10-CM | POA: Diagnosis not present

## 2022-03-31 DIAGNOSIS — H4419 Other endophthalmitis: Secondary | ICD-10-CM | POA: Diagnosis not present

## 2022-04-04 ENCOUNTER — Encounter: Payer: PPO | Admitting: *Deleted

## 2022-04-04 DIAGNOSIS — Z48812 Encounter for surgical aftercare following surgery on the circulatory system: Secondary | ICD-10-CM | POA: Diagnosis not present

## 2022-04-04 DIAGNOSIS — Z952 Presence of prosthetic heart valve: Secondary | ICD-10-CM

## 2022-04-04 NOTE — Progress Notes (Signed)
Daily Session Note  Patient Details  Name: Kalai Baca MRN: [id number] Date of Birth: 09-02-1955 Referring Provider:   Flowsheet Row Cardiac Rehab from 02/02/2022 in [facility] Cardiac and Pulmonary Rehab  Referring Provider Isaias Cowman MD       Encounter Date: 04/04/2022  Check In:  Session Check In - 04/04/22 0818       Check-In   Supervising physician immediately available to respond to emergencies See telemetry face sheet for immediately available ER MD    Location [facility]-Cardiac & Pulmonary Rehab    Staff Present Earlean Shawl, BS, ACSM CEP, Exercise Physiologist;Joseph [place], RCP,RRT,BSRT;Other   Darlyne Russian, RN   Virtual Visit No    Medication changes reported     No    Fall or balance concerns reported    No    Warm-up and Cool-down Performed on first and last piece of equipment    Resistance Training Performed Yes    VAD Patient? No    PAD/SET Patient? No      Pain Assessment   Currently in Pain? No/denies                Social History   Tobacco Use  Smoking Status Former   Packs/day: 1.00   Years: 46.00   Total pack years: 46.00   Types: Cigarettes   Quit date: 10/09/2021   Years since quitting: 0.4  Smokeless Tobacco Never    Goals Met:  Independence with exercise equipment Exercise tolerated well No report of concerns or symptoms today Strength training completed today  Goals Unmet:  Not Applicable  Comments: Pt able to follow exercise prescription today without complaint.  Will continue to monitor for progression.    Dr. Emily Filbert is Medical Director for [place].  Dr. Ottie Glazier is Medical Director for [facility] Pulmonary Rehabilitation.

## 2022-04-06 ENCOUNTER — Encounter: Payer: PPO | Admitting: *Deleted

## 2022-04-06 DIAGNOSIS — Z48812 Encounter for surgical aftercare following surgery on the circulatory system: Secondary | ICD-10-CM | POA: Diagnosis not present

## 2022-04-06 DIAGNOSIS — Z952 Presence of prosthetic heart valve: Secondary | ICD-10-CM

## 2022-04-06 NOTE — Progress Notes (Signed)
Daily Session Note  Patient Details  Name: [name] MRN: [id number] Date of Birth: 16-Jan-1956 Referring Provider:   Flowsheet Row Cardiac Rehab from 02/02/2022 in [facility] Cardiac and Pul[facility] Rehab  Referring Provider Isaias Cowman MD       Encounter Date: 04/06/2022  Check In:  Session Check In - 04/06/22 0823       Check-In   Supervising physician immediately available to respond to emergencies See telemetry face sheet for immediately available ER MD    Location [facility]-Cardiac & Pulmonary Rehab    Staff Present Justin Mend, RCP,RRT,BSRT;Noah Tickle, BS, Exercise Physiologist;Other   Darlyne Russian, RN   Virtual Visit No    Medication changes reported     No    Fall or balance concerns reported    No    Warm-up and Cool-down Performed on first and last piece of equipment    Resistance Training Performed Yes    VAD Patient? No    PAD/SET Patient? No      Pain Assessment   Currently in Pain? No/denies                Social History   Tobacco Use  Smoking Status Former   Packs/day: 1.00   Years: 46.00   Total pack years: 46.00   Types: Cigarettes   Quit date: 10/09/2021   Years since quitting: 0.4  Smokeless Tobacco Never    Goals Met:  Independence with exercise equipment Exercise tolerated well No report of concerns or symptoms today Strength training completed today  Goals Unmet:  Not Applicable  Comments: Pt able to follow exercise prescription today without complaint.  Will continue to monitor for progression.    Dr. Emily Filbert is Medical Director for [facility].  Dr. Ottie Glazier is Medical Director for [facility].

## 2022-04-08 ENCOUNTER — Encounter: Payer: PPO | Admitting: *Deleted

## 2022-04-08 DIAGNOSIS — Z48812 Encounter for surgical aftercare following surgery on the circulatory system: Secondary | ICD-10-CM | POA: Diagnosis not present

## 2022-04-08 DIAGNOSIS — Z952 Presence of prosthetic heart valve: Secondary | ICD-10-CM

## 2022-04-08 NOTE — Progress Notes (Signed)
Daily Session Note  Patient Details  Name: Lleyton Chea MRN: [id number] Date of Birth: 04/17/1956 Referring Provider:   Flowsheet Row Cardiac Rehab from 02/02/2022 in [facility] Cardiac and Pulmonary Rehab  Referring Provider Paraschos, Alexander MD       Encounter Date: 04/08/2022  Check In:  Session Check In - 04/08/22 0831       Check-In   Supervising physician immediately available to respond to emergencies See telemetry face sheet for immediately available ER MD    Location [facility]    Staff Present Susanne Bice, RN, BSN, CCRP;Joseph Hood, RCP,RRT,BSRT    Virtual Visit No    Medication changes reported     No    Fall or balance concerns reported    No    Warm-up and Cool-down Performed on first and last piece of equipment    Resistance Training Performed No    VAD Patient? No    PAD/SET Patient? No      Pain Assessment   Currently in Pain? No/denies                Social History   Tobacco Use  Smoking Status Former   Packs/day: 1.00   Years: 46.00   Total pack years: 46.00   Types: Cigarettes   Quit date: 10/09/2021   Years since quitting: 0.4  Smokeless Tobacco Never    Goals Met:  Independence with exercise equipment Exercise tolerated well No report of concerns or symptoms today  Goals Unmet:  Not Applicable  Comments: Pt able to follow exercise prescription today without complaint.  Will continue to monitor for progression.    Dr. Mark Miller is Medical Director for [facility].  Dr. Fuad Aleskerov is Medical Director for [facility]. 

## 2022-04-11 ENCOUNTER — Encounter: Payer: PPO | Attending: Cardiology | Admitting: *Deleted

## 2022-04-11 DIAGNOSIS — Z952 Presence of prosthetic heart valve: Secondary | ICD-10-CM | POA: Insufficient documentation

## 2022-04-11 NOTE — Progress Notes (Signed)
Daily Session Note  Patient Details  Name: [name] MRN: [id number] Date of Birth: 11-22-1955 Referring Provider:   Flowsheet Row Cardiac Rehab from 02/02/2022 in [facility] Cardiac and Pulmonary Rehab  Referring Provider Isaias Cowman MD       Encounter Date: 04/11/2022  Check In:  Session Check In - 04/11/22 0838       Check-In   Supervising physician immediately available to respond to emergencies See telemetry face sheet for immediately available ER MD    Location [facility] & Pul[facility]    Staff Present Earlean Shawl, BS, ACSM CEP, Exercise Physiologist;Joseph Rosebud Poles, RN, [place]    Virtual Visit No    Medication changes reported     No    Fall or balance concerns reported    No    Warm-up and Cool-down Performed on first and last piece of equipment    Resistance Training Performed Yes    VAD Patient? No    PAD/SET Patient? No      Pain Assessment   Currently in Pain? No/denies                Social History   Tobacco Use  Smoking Status Former   Packs/day: 1.00   Years: 46.00   Total pack years: 46.00   Types: Cigarettes   Quit date: 10/09/2021   Years since quitting: 0.5  Smokeless Tobacco Never    Goals Met:  Independence with exercise equipment Exercise tolerated well No report of concerns or symptoms today Strength training completed today  Goals Unmet:  Not Applicable  Comments: Pt able to follow exercise prescription today without complaint.  Will continue to monitor for progression.    Dr. Emily Filbert is Medical Director for [facility].  Dr. Ottie Glazier is Medical Director for [facility].

## 2022-04-13 ENCOUNTER — Encounter: Payer: Self-pay | Admitting: *Deleted

## 2022-04-13 ENCOUNTER — Encounter: Payer: PPO | Admitting: *Deleted

## 2022-04-13 VITALS — Ht 71.5 in | Wt 201.9 lb

## 2022-04-13 DIAGNOSIS — Z952 Presence of prosthetic heart valve: Secondary | ICD-10-CM

## 2022-04-13 NOTE — Patient Instructions (Signed)
Discharge Patient Instructions  Patient Details  Name: [name] MRN: [id number] Date of Birth: 30-Nov-1955 Referring Provider:  [facility]   Number of Visits: 36  Reason for Discharge:  Patient reached a stable level of exercise. Patient independent in their exercise. Patient has met program and personal goals.  Smoking History:  Social History   Tobacco Use  Smoking Status Former   Packs/day: 1.00   Years: 46.00   Total pack years: 46.00   Types: Cigarettes   Quit date: 10/09/2021   Years since quitting: 0.5  Smokeless Tobacco Never    Diagnosis:  S/P aortic valve replacement  Initial Exercise Prescription:  Initial Exercise Prescription - 02/02/22 1100       Date of Initial Exercise RX and Referring Provider   Date 02/02/22    Referring Provider Paraschos, Alexander MD      Oxygen   Maintain Oxygen Saturation 88% or higher      Treadmill   MPH 2.7    Grade 0.5    Minutes 15    METs 3.25      NuStep   Level 3    SPM 80    Minutes 15    METs 3.5      REL-XR   Level 2    Speed 50    Minutes 15    METs 3.5      T5 Nustep   Level 2    SPM 80    Minutes 15    METs 3.5      Prescription Details   Frequency (times per week) 99 - 136    Duration Progress to 30 minutes of continuous aerobic without signs/symptoms of physical distress      Intensity   THRR 40-80% of Max Heartrate 99 - 136    Ratings of Perceived Exertion 11-13    Perceived Dyspnea 0-4      Progression   Progression Continue to progress workloads to maintain intensity without signs/symptoms of physical distress.      Resistance Training   Training Prescription Yes    Weight 5 lb    Reps 10-15             Discharge Exercise Prescription (Final Exercise Prescription Changes):  Exercise Prescription Changes - 04/11/22 1500       Response to Exercise   Blood Pressure (Admit) 122/60    Blood Pressure (Exit) 128/64    Heart Rate (Admit) 69 bpm    Heart Rate  (Exercise) 113 bpm    Heart Rate (Exit) 110 bpm    Oxygen Saturation (Admit) 96 %    Oxygen Saturation (Exercise) 95 %    Oxygen Saturation (Exit) 95 %    Rating of Perceived Exertion (Exercise) 13    Symptoms none    Duration Continue with 30 min of aerobic exercise without signs/symptoms of physical distress.    Intensity THRR unchanged      Progression   Progression Continue to progress workloads to maintain intensity without signs/symptoms of physical distress.    Average METs 4.69      Resistance Training   Training Prescription Yes    Weight 4 lb    Reps 10-15      Interval Training   Interval Training No      Treadmill   MPH 2.8    Grade 5    Minutes 15    METs 5.07      NuStep   Level 5    Minutes  15    METs 5.6      T5 Nustep   Level 5    Minutes 15    METs 3      Home Exercise Plan   Plans to continue exercise at Home (comment)   walking, [employer]   Frequency Add 3 additional days to program exercise sessions.    Initial Home Exercises Provided 02/23/22      Oxygen   Maintain Oxygen Saturation 88% or higher             Functional Capacity:  6 Minute Walk     Row Name 02/02/22 1111 04/13/22 0827       6 Minute Walk   Phase Initial Discharge    Distance 1410 feet 1810 feet    Distance % Change -- 28.3 %    Distance Feet Change -- 400 ft    Walk Time 6 minutes 6 minutes    # of Rest Breaks 0 0    MPH 2.67 3.43    METS 3.57 4.64    RPE 9 14    Perceived Dyspnea  0 0    VO2 Peak 12.51 16.23    Symptoms No No    Resting HR 62 bpm 79 bpm    Resting BP 126/70 138/70    Resting Oxygen Saturation  98 % 96 %    Exercise Oxygen Saturation  during 6 min walk 99 % 98 %    Max Ex. HR 92 bpm 120 bpm    Max Ex. BP 142/68 168/74    2 Minute Post BP 132/70 146/68             Quality of Life:  Quality of Life - 02/02/22 0945       Quality of Life   Select Quality of Life      Quality of Life Scores   Health/Function Pre 24.4 %     Socioeconomic Pre 28.13 %    Psych/Spiritual Pre 30 %    Family Pre 30 %    GLOBAL Pre 27.17 %            Nutrition & Weight - Outcomes:  Pre Biometrics - 02/02/22 1106       Pre Biometrics   Height 5' 11.5" (1.816 m)    Weight 184 lb 3.2 oz (83.6 kg)   Was ~ 168 lb 1 month ago, confirmed with patient.   BMI (Calculated) 25.34    Single Leg Stand 22.4 seconds             Post Biometrics - 04/13/22 0828        Post  Biometrics   Height 5' 11.5" (1.816 m)    Weight 201 lb 14.4 oz (91.6 kg)    BMI (Calculated) 27.77    Single Leg Stand 30 seconds             Nutrition:  Nutrition Therapy & Goals - 02/28/22 0845       Nutrition Therapy   Diet Heart healthy, low Na    Protein (specify units) 65g    Fiber 30 grams    Whole Grain Foods 3 servings    Saturated Fats 16 max. grams    Fruits and Vegetables 8 servings/day    Sodium 2 grams      Personal Nutrition Goals   Nutrition Goal ST: practice MyPlate structure  LT: follow MyPlate guidelines, eat at least 5 fruit/vegetable servings per day, limit saturated fat <16g/day  Comments 66 y.o. M admitted to cardiac rehab s/p aortic valve replacement. PMHx inlcudes bicuspid aortic valve, HTN, CKD, HLD. Relevant medications includes MVI with minerals. PYP Score: 51.  Vegetables & Fruits 5/12. Breads, Grains & Cereals 9/12. Red & Processed Meat 10/12. Poultry 0/2. Fish & Shellfish 1/4. Beans, Nuts & Seeds 2/4. Milk & Dairy Foods 3/6. Toppings, Oils, Seasonings & Salt 12/20. Sweets, Snacks & Restaurant Food 5/14. Beverages 4/10.  They report going out to eat about 3x/week. B: bacon, eggs, toast (whole wheat), grits (butter, splenda) or sometimes oatmeal (butter). S: chips, cookie, cake, fruit, etc. L: vaires: baked chicken with vegetables, fried chicken, sandwiches from subway, burgers, fish. S: popcorn, fruit, etc. D: same as lunch ( they will only fry fish, baked chicken in oven or airfyer, fajitas, tomato sandwich, banana  sandwich, variety of vegetables (green)). They use vegetable, canola, and olive oil. He reports he will use seasonings which can include salt as well as salt. Discussed general heart healthy eating, [name] is nervous that he will not be able to structure his meals like the MyPlate guideline; discussed trying to add in more nutrient dense foods to meals and be mindful of MyPlate strucutre.      Intervention Plan   Intervention Prescribe, educate and counsel regarding individualized specific dietary modifications aiming towards targeted core components such as weight, hypertension, lipid management, diabetes, heart failure and other comorbidities.    Expected Outcomes Short Term Goal: Understand basic principles of dietary content, such as calories, fat, sodium, cholesterol and nutrients.;Short Term Goal: A plan has been developed with personal nutrition goals set during dietitian appointment.;Long Term Goal: Adherence to prescribed nutrition plan.

## 2022-04-13 NOTE — Progress Notes (Signed)
Cardiac Individual Treatment Plan  Patient Details  Name: [name] MRN: [id number] Date of Birth: 14-Aug-1955 Referring Provider:   Flowsheet Row Cardiac Rehab from 02/02/2022 in [facility] Cardiac and Pulmonary Rehab  Referring Provider Isaias Cowman MD       Initial Encounter Date:  Flowsheet Row Cardiac Rehab from 02/02/2022 in [facility] Cardiac and Pulmonary Rehab  Date 02/02/22       Visit Diagnosis: S/P aortic valve replacement  S/P TAVR (transcatheter aortic valve replacement)  Patient's Home Medications on Admission:  Current Outpatient Medications:    amLODipine (NORVASC) 10 MG tablet, Take 10 mg by mouth daily., Disp: , Rfl:    aspirin EC 81 MG tablet, Take 81 mg by mouth daily. Swallow whole., Disp: , Rfl:    atorvastatin (LIPITOR) 80 MG tablet, Take 1 tablet (80 mg total) by mouth daily. (Patient not taking: Reported on 01/03/2022), Disp: , Rfl:    Multiple Vitamins-Minerals (MULTIVITAMIN WITH MINERALS) tablet, Take 1 tablet by mouth daily., Disp: , Rfl:    prednisoLONE acetate (PRED FORTE) 1 % ophthalmic suspension, Place 1 drop into the right eye 6 (six) times daily., Disp: , Rfl:   Past Medical History: Past Medical History:  Diagnosis Date   Hypertension     Tobacco Use: Social History   Tobacco Use  Smoking Status Former   Packs/day: 1.00   Years: 46.00   Total pack years: 46.00   Types: Cigarettes   Quit date: 10/09/2021   Years since quitting: 0.5  Smokeless Tobacco Never    Labs: Review Flowsheet       Latest Ref Rng & Units 10/12/2021  Labs for ITP Cardiac and Pulmonary Rehab  Cholestrol 0 - 200 mg/dL 132   LDL (calc) 0 - 99 mg/dL 90   HDL-C >40 mg/dL 17   Trlycerides <150 mg/dL 124   Hemoglobin A1c 4.8 - 5.6 % 5.7      Exercise Target Goals: Exercise Program Goal: Individual exercise prescription set using results from initial 6 min walk test and THRR while considering  patient's activity barriers and safety.   Exercise Prescription  Goal: Initial exercise prescription builds to 30-45 minutes a day of aerobic activity, 2-3 days per week.  Home exercise guidelines will be given to patient during program as part of exercise prescription that the participant will acknowledge.   Education: Aerobic Exercise: - Group verbal and visual presentation on the components of exercise prescription. Introduces F.I.T.T principle from ACSM for exercise prescriptions.  Reviews F.I.T.T. principles of aerobic exercise including progression. Written material given at graduation. Flowsheet Row Cardiac Rehab from 04/06/2022 in [facility] Cardiac and Pulmonary Rehab  Date 03/16/22  Educator [facility]  Instruction Review Code 1- Verbalizes Understanding       Education: Resistance Exercise: - Group verbal and visual presentation on the components of exercise prescription. Introduces F.I.T.T principle from ACSM for exercise prescriptions  Reviews F.I.T.T. principles of resistance exercise including progression. Written material given at graduation.    Education: Exercise & Equipment Safety: - Individual verbal instruction and demonstration of equipment use and safety with use of the equipment. Flowsheet Row Cardiac Rehab from 04/06/2022 in [facility] Cardiac and Pulmonary Rehab  Date 01/03/22  Educator [place]  Instruction Review Code 1- Verbalizes Understanding       Education: Exercise Physiology & General Exercise Guidelines: - Group verbal and written instruction with models to review the exercise physiology of the cardiovascular system and associated critical values. Provides general exercise guidelines with specific guidelines to those with  heart or lung disease.  Flowsheet Row Cardiac Rehab from 04/06/2022 in [facility] Cardiac and Pulmonary Rehab  Education need identified 02/02/22  Date 03/09/22  Educator [facility]  Instruction Review Code 1- [employer] Understanding       Education: Flexibility, Balance, Mind/Body Relaxation: - Group verbal and visual  presentation with interactive activity on the components of exercise prescription. Introduces F.I.T.T principle from ACSM for exercise prescriptions. Reviews F.I.T.T. principles of flexibility and balance exercise training including progression. Also discusses the mind body connection.  Reviews various relaxation techniques to help reduce and manage stress (i.e. Deep breathing, progressive muscle relaxation, and visualization). Balance handout provided to take home. Written material given at graduation. Flowsheet Row Cardiac Rehab from 04/06/2022 in [facility] Cardiac and Pulmonary Rehab  Date 03/23/22  Educator NT  Instruction Review Code 1- Verbalizes Understanding       Activity Barriers & Risk Stratification:  Activity Barriers & Cardiac Risk Stratification - 02/02/22 1104       Activity Barriers & Cardiac Risk Stratification   Activity Barriers Muscular Weakness    Cardiac Risk Stratification Moderate             6 Minute Walk:  6 Minute Walk     Row Name 02/02/22 1111         6 Minute Walk   Phase Initial     Distance 1410 feet     Walk Time 6 minutes     # of Rest Breaks 0     MPH 2.67     METS 3.57     RPE 9     Perceived Dyspnea  0     VO2 Peak 12.51     Symptoms No     Resting HR 62 bpm     Resting BP 126/70     Resting Oxygen Saturation  98 %     Exercise Oxygen Saturation  during 6 min walk 99 %     Max Ex. HR 92 bpm     Max Ex. BP 142/68     2 Minute Post BP 132/70              Oxygen Initial Assessment:   Oxygen Re-Evaluation:   Oxygen Discharge (Final Oxygen Re-Evaluation):   Initial Exercise Prescription:  Initial Exercise Prescription - 02/02/22 1100       Date of Initial Exercise RX and Referring Provider   Date 02/02/22    Referring Provider Paraschos, Alexander MD      Oxygen   Maintain Oxygen Saturation 88% or higher      Treadmill   MPH 2.7    Grade 0.5    Minutes 15    METs 3.25      NuStep   Level 3    SPM 80     Minutes 15    METs 3.5      REL-XR   Level 2    Speed 50    Minutes 15    METs 3.5      T5 Nustep   Level 2    SPM 80    Minutes 15    METs 3.5      Prescription Details   Frequency (times per week) 99 - 136    Duration Progress to 30 minutes of continuous aerobic without signs/symptoms of physical distress      Intensity   THRR 40-80% of Max Heartrate 99 - 136    Ratings of Perceived Exertion 11-13    Perceived Dyspnea  0-4      Progression   Progression Continue to progress workloads to maintain intensity without signs/symptoms of physical distress.      Resistance Training   Training Prescription Yes    Weight 5 lb    Reps 10-15             Perform Capillary Blood Glucose checks as needed.  Exercise Prescription Changes:   Exercise Prescription Changes     Row Name 02/02/22 1100 02/23/22 0800 03/15/22 0900 03/29/22 1400 04/11/22 1500     Response to Exercise   Blood Pressure (Admit) 126/70 -- 112/60 122/76 122/60   Blood Pressure (Exercise) 142/68 -- -- -- --   Blood Pressure (Exit) 132/70 -- 124/62 112/62 128/64   Heart Rate (Admit) 62 bpm -- 80 bpm 73 bpm 69 bpm   Heart Rate (Exercise) 92 bpm -- 131 bpm 126 bpm 113 bpm   Heart Rate (Exit) 69 bpm -- 87 bpm 86 bpm 110 bpm   Oxygen Saturation (Admit) 98 % -- -- -- 96 %   Oxygen Saturation (Exercise) 99 % -- -- -- 95 %   Oxygen Saturation (Exit) 99 % -- -- -- 95 %   Rating of Perceived Exertion (Exercise) 9 -- '13 13 13   ' Perceived Dyspnea (Exercise) 0 -- -- -- --   Symptoms none -- none none none   Comments walk test results -- -- -- --   Duration -- -- Continue with 30 min of aerobic exercise without signs/symptoms of physical distress. Continue with 30 min of aerobic exercise without signs/symptoms of physical distress. Continue with 30 min of aerobic exercise without signs/symptoms of physical distress.   Intensity -- -- THRR unchanged THRR unchanged THRR unchanged     Progression   Progression -- --  Continue to progress workloads to maintain intensity without signs/symptoms of physical distress. Continue to progress workloads to maintain intensity without signs/symptoms of physical distress. Continue to progress workloads to maintain intensity without signs/symptoms of physical distress.   Average METs -- -- 4.12 5.14 4.69     Resistance Training   Training Prescription -- -- Yes Yes Yes   Weight -- -- 5 lb 5 lb 4 lb   Reps -- -- 10-15 10-15 10-15     Interval Training   Interval Training -- -- No No No     Treadmill   MPH -- -- 3.3 3 2.8   Grade -- -- 7.'5 8 5   ' Minutes -- -- '15 15 15   ' METs -- -- 6.9 6.61 5.07     Recumbant Bike   Level -- -- 1 -- --   Minutes -- -- 15 -- --   METs -- -- 2.91 -- --     NuStep   Level -- -- '5 5 5   ' Minutes -- -- '15 15 15   ' METs -- -- 4.2 4.6 5.6     T5 Nustep   Level -- -- '2 4 5   ' Minutes -- -- '15 15 15   ' METs -- -- 2.2 3.2 3     Home Exercise Plan   Plans to continue exercise at -- Home (comment)  walking, [facility] (comment)  walking, [facility] (comment)  walking, [facility] (comment)  walking, [employer]   Frequency -- Add 3 additional days to program exercise sessions. Add 3 additional days to program exercise sessions. Add 3 additional days to program exercise sessions. Add 3 additional days to program exercise sessions.  Initial Home Exercises Provided -- 02/23/22 02/23/22 02/23/22 02/23/22     Oxygen   Maintain Oxygen Saturation -- -- 88% or higher 88% or higher 88% or higher            Exercise Comments:   Exercise Comments     Row Name 02/14/22 0836           Exercise Comments First full day of exercise!  Patient was oriented to gym and equipment including functions, settings, policies, and procedures.  Patient's individual exercise prescription and treatment plan were reviewed.  All starting workloads were established based on the results of the 6 minute walk test done at initial orientation visit.  The  plan for exercise progression was also introduced and progression will be customized based on patient's performance and goals.                Exercise Goals and Review:   Exercise Goals     Row Name 02/02/22 1114             Exercise Goals   Increase Physical Activity Yes       Intervention Develop an individualized exercise prescription for aerobic and resistive training based on initial evaluation findings, risk stratification, comorbidities and participant's personal goals.;Provide advice, education, support and counseling about physical activity/exercise needs.       Expected Outcomes Short Term: Attend rehab on a regular basis to increase amount of physical activity.;Long Term: Exercising regularly at least 3-5 days a week.;Long Term: Add in home exercise to make exercise part of routine and to increase amount of physical activity.       Increase Strength and Stamina Yes       Intervention Provide advice, education, support and counseling about physical activity/exercise needs.;Develop an individualized exercise prescription for aerobic and resistive training based on initial evaluation findings, risk stratification, comorbidities and participant's personal goals.       Expected Outcomes Short Term: Increase workloads from initial exercise prescription for resistance, speed, and METs.;Short Term: Perform resistance training exercises routinely during rehab and add in resistance training at home;Long Term: Improve cardiorespiratory fitness, muscular endurance and strength as measured by increased METs and functional capacity (6MWT)       Able to understand and use rate of perceived exertion (RPE) scale Yes       Intervention Provide education and explanation on how to use RPE scale       Expected Outcomes Short Term: Able to use RPE daily in rehab to express subjective intensity level;Long Term:  Able to use RPE to guide intensity level when exercising independently       Able to  understand and use Dyspnea scale Yes       Intervention Provide education and explanation on how to use Dyspnea scale       Expected Outcomes Short Term: Able to use Dyspnea scale daily in rehab to express subjective sense of shortness of breath during exertion;Long Term: Able to use Dyspnea scale to guide intensity level when exercising independently       Knowledge and understanding of Target Heart Rate Range (THRR) Yes       Intervention Provide education and explanation of THRR including how the numbers were predicted and where they are located for reference       Expected Outcomes Short Term: Able to state/look up THRR;Long Term: Able to use THRR to govern intensity when exercising independently;Short Term: Able to use daily as guideline for intensity in rehab  Able to check pulse independently Yes       Intervention Provide education and demonstration on how to check pulse in carotid and radial arteries.;Review the importance of being able to check your own pulse for safety during independent exercise       Expected Outcomes Long Term: Able to check pulse independently and accurately;Short Term: Able to explain why pulse checking is important during independent exercise       Understanding of Exercise Prescription Yes       Intervention Provide education, explanation, and written materials on patient's individual exercise prescription       Expected Outcomes Short Term: Able to explain program exercise prescription;Long Term: Able to explain home exercise prescription to exercise independently                Exercise Goals Re-Evaluation :  Exercise Goals Re-Evaluation     Row Name 02/14/22 0836 02/23/22 0856 03/15/22 0948 03/29/22 1427 04/11/22 1531     Exercise Goal Re-Evaluation   Exercise Goals Review Able to understand and use rate of perceived exertion (RPE) scale;Knowledge and understanding of Target Heart Rate Range (THRR);Understanding of Exercise Prescription;Able to  understand and use Dyspnea scale Able to understand and use rate of perceived exertion (RPE) scale;Knowledge and understanding of Target Heart Rate Range (THRR);Understanding of Exercise Prescription;Able to understand and use Dyspnea scale;Increase Physical Activity;Increase Strength and Stamina;Able to check pulse independently Increase Physical Activity;Increase Strength and Stamina;Understanding of Exercise Prescription Increase Physical Activity;Increase Strength and Stamina;Understanding of Exercise Prescription Increase Physical Activity;Increase Strength and Stamina;Understanding of Exercise Prescription   Comments Reviewed RPE and dyspnea scales, THR and program prescription with pt today.  Pt voiced understanding and was given a copy of goals to take home. Ivann is off to a good start in rehab.  We talked about needing more than just three days a week.  Reviewed home exercise with pt today.  Pt plans to walk and go back to [facility] for exercise.  Reviewed THR, pulse, RPE, sign and symptoms, pulse oximetery and when to call 911 or MD.  Also discussed weather considerations and indoor options.  Pt voiced understanding. Hurman is doing well since he started rehab. He has already increased his workloads on his machines quite a bit. He is walking at a 3.3 mph/ 7.5% incline on the treadmill, almost at 7 METS! He has alrso increased to level 5 on the T4 Nustep. It appears he tried the recumbent bike at level 1, and shuld up his workload on it for next time. He is hitting his THR each session. We will continue to monitor. Yesenia continues to do well in rehab. He recently improved his overall average MET level to 5.14 METs. He also increased his incline on the treadmill to 8% while maintaining a speed of 3 mph. Daysen improved to level 4 on the T5 as well. We will continue to monitor his progress in the program. Huey is doing well in rehab.  He dropped on his weights and treadmill speed.  We will encourage  him to return to consistent workloads.  We will continue to monitor his progress.   Expected Outcomes Short: Use RPE daily to regulate intensity. Long: Follow program prescription in THR. Short: Start to add in more exercise at home Long: Conitnue to improve stamina Short: Increase workload on recumbent bike Long: Continue to increase overall MET level Short: Continue to increase workloads Long: Continue to increase strength and stamina Short: Aim for consistent workloads Long: Conitnue to  improve stamina            Discharge Exercise Prescription (Final Exercise Prescription Changes):  Exercise Prescription Changes - 04/11/22 1500       Response to Exercise   Blood Pressure (Admit) 122/60    Blood Pressure (Exit) 128/64    Heart Rate (Admit) 69 bpm    Heart Rate (Exercise) 113 bpm    Heart Rate (Exit) 110 bpm    Oxygen Saturation (Admit) 96 %    Oxygen Saturation (Exercise) 95 %    Oxygen Saturation (Exit) 95 %    Rating of Perceived Exertion (Exercise) 13    Symptoms none    Duration Continue with 30 min of aerobic exercise without signs/symptoms of physical distress.    Intensity THRR unchanged      Progression   Progression Continue to progress workloads to maintain intensity without signs/symptoms of physical distress.    Average METs 4.69      Resistance Training   Training Prescription Yes    Weight 4 lb    Reps 10-15      Interval Training   Interval Training No      Treadmill   MPH 2.8    Grade 5    Minutes 15    METs 5.07      NuStep   Level 5    Minutes 15    METs 5.6      T5 Nustep   Level 5    Minutes 15    METs 3      Home Exercise Plan   Plans to continue exercise at Home (comment)   walking, [employer]   Frequency Add 3 additional days to program exercise sessions.    Initial Home Exercises Provided 02/23/22      Oxygen   Maintain Oxygen Saturation 88% or higher             Nutrition:  Target Goals: Understanding of nutrition  guidelines, daily intake of sodium <1531m, cholesterol <2028m calories 30% from fat and 7% or less from saturated fats, daily to have 5 or more servings of fruits and vegetables.  Education: All About Nutrition: -Group instruction provided by verbal, written material, interactive activities, discussions, models, and posters to present general guidelines for heart healthy nutrition including fat, fiber, MyPlate, the role of sodium in heart healthy nutrition, utilization of the nutrition label, and utilization of this knowledge for meal planning. Follow up email sent as well. Written material given at graduation. Flow[facility]  Date 04/06/22  [facility] Review Code 1- Verbalizes Understanding       Biometrics:  Pre Biometrics - 02/02/22 1106       Pre Biometrics   Height 5' 11.5" (1.816 m)    Weight 184 lb 3.2 oz (83.6 kg)   Was ~ 168 lb 1 month ago, confirmed with patient.   BMI (Calculated) 25.34    Single Leg Stand 22.4 seconds              Nutrition Therapy Plan and Nutrition Goals:  Nutrition Therapy & Goals - 02/28/22 0845       Nutrition Therapy   Diet Heart healthy, low Na    Protein (specify units) 65g    Fiber 30 grams    Whole Grain Foods 3 servings    Saturated Fats 16 max. grams    Fruits and Vegetables 8 servings/day    Sodium 2 grams  Personal Nutrition Goals   Nutrition Goal ST: practice MyPlate structure  LT: follow MyPlate guidelines, eat at least 5 fruit/vegetable servings per day, limit saturated fat <16g/day    Comments 66 y.o. M admitted to cardiac rehab s/p aortic valve replacement. PMHx inlcudes bicuspid aortic valve, HTN, CKD, HLD. Relevant medications includes MVI with minerals. PYP Score: 51.  Vegetables & Fruits 5/12. Breads, Grains & Cereals 9/12. Red & Processed Meat 10/12. Poultry 0/2. Fish & Shellfish 1/4. Beans, Nuts & Seeds 2/4. Milk & Dairy Foods 3/6.  Toppings, Oils, Seasonings & Salt 12/20. Sweets, Snacks & Restaurant Food 5/14. Beverages 4/10.  They report going out to eat about 3x/week. B: bacon, eggs, toast (whole wheat), grits (butter, splenda) or sometimes oatmeal (butter). S: chips, cookie, cake, fruit, etc. L: vaires: baked chicken with vegetables, fried chicken, sandwiches from subway, burgers, fish. S: popcorn, fruit, etc. D: same as lunch ( they will only fry fish, baked chicken in oven or airfyer, fajitas, tomato sandwich, banana sandwich, variety of vegetables (green)). They use vegetable, canola, and olive oil. He reports he will use seasonings which can include salt as well as salt. Discussed general heart healthy eating, Nagee is nervous that he will not be able to structure his meals like the MyPlate guideline; discussed trying to add in more nutrient dense foods to meals and be mindful of MyPlate strucutre.      Intervention Plan   Intervention Prescribe, educate and counsel regarding individualized specific dietary modifications aiming towards targeted core components such as weight, hypertension, lipid management, diabetes, heart failure and other comorbidities.    Expected Outcomes Short Term Goal: Understand basic principles of dietary content, such as calories, fat, sodium, cholesterol and nutrients.;Short Term Goal: A plan has been developed with personal nutrition goals set during dietitian appointment.;Long Term Goal: Adherence to prescribed nutrition plan.             Nutrition Assessments:  MEDIFICTS Score Key: ?70 Need to make dietary changes  40-70 Heart Healthy Diet ? 40 Therapeutic Level Cholesterol Diet  Flowsheet Row Cardiac Rehab from 02/02/2022 in [facility] Cardiac and Pulmonary Rehab  Picture Your Plate Total Score on Admission 51      Picture Your Plate Scores: <86 Unhealthy dietary pattern with much room for improvement. 41-50 Dietary pattern unlikely to meet recommendations for good health and room for  improvement. 51-60 More healthful dietary pattern, with some room for improvement.  >60 Healthy dietary pattern, although there may be some specific behaviors that could be improved.    Nutrition Goals Re-Evaluation:  Nutrition Goals Re-Evaluation     [place] Name 02/23/22 0825 04/04/22 0813           Goals   Current Weight -- 200 lb (90.7 kg)      Nutrition Goal Meet with [facility] Monday Reduce Sweets      Comment Meet with [facility] Monday Kenith is doing ok with his diet. He has been eating more sweets than he wants to. Sometimes he will wake up and eat sweets then go back to bed. He knows that they are alot of calories and not the best for him. He is going to work on reducing his sweet intake.      Expected Outcome Meet with [facility] Monday Short: reduce sweets. Long: keeps sweets at a minumum.               Nutrition Goals Discharge (Final Nutrition Goals Re-Evaluation):  Nutrition Goals Re-Evaluation - 04/04/22 1683  Goals   Current Weight 200 lb (90.7 kg)    Nutrition Goal Reduce Sweets    Comment Lavar is doing ok with his diet. He has been eating more sweets than he wants to. Sometimes he will wake up and eat sweets then go back to bed. He knows that they are alot of calories and not the best for him. He is going to work on reducing his sweet intake.    Expected Outcome Short: reduce sweets. Long: keeps sweets at a minumum.             Psychosocial: Target Goals: Acknowledge presence or absence of significant depression and/or stress, maximize coping skills, provide positive support system. Participant is able to verbalize types and ability to use techniques and skills needed for reducing stress and depression.   Education: Stress, Anxiety, and Depression - Group verbal and visual presentation to define topics covered.  Reviews how body is impacted by stress, anxiety, and depression.  Also discusses healthy ways to reduce stress and to treat/manage anxiety and  depression.  Written material given at graduation. Flowsheet Row Cardiac Rehab from 04/06/2022 in [facility] Cardiac and Pulmonary Rehab  Date 03/02/22  Educator [facility]  Instruction Review Code 1- [employer] Understanding       Education: Sleep Hygiene -Provides group verbal and written instruction about how sleep can affect your health.  Define sleep hygiene, discuss sleep cycles and impact of sleep habits. Review good sleep hygiene tips.    Initial Review & Psychosocial Screening:  Initial Psych Review & Screening - 01/03/22 0958       Initial Review   Current issues with None Identified      Family Dynamics   Good Support System? Yes    Comments He can look to his wife for support. He has not done a cardiac rehab program before. He can also look to his daughter and four grandkids for support.      Barriers   Psychosocial barriers to participate in program The patient should benefit from training in stress management and relaxation.;There are no identifiable barriers or psychosocial needs.      Screening Interventions   Interventions Encouraged to exercise;To provide support and resources with identified psychosocial needs;Provide feedback about the scores to participant    Expected Outcomes Short Term goal: Utilizing psychosocial counselor, staff and physician to assist with identification of specific Stressors or current issues interfering with healing process. Setting desired goal for each stressor or current issue identified.;Long Term Goal: Stressors or current issues are controlled or eliminated.;Short Term goal: Identification and review with participant of any Quality of Life or Depression concerns found by scoring the questionnaire.;Long Term goal: The participant improves quality of Life and PHQ9 Scores as seen by post scores and/or verbalization of changes             Quality of Life Scores:   Quality of Life - 02/02/22 0945       Quality of Life   Select Quality of Life       Quality of Life Scores   Health/Function Pre 24.4 %    Socioeconomic Pre 28.13 %    Psych/Spiritual Pre 30 %    Family Pre 30 %    GLOBAL Pre 27.17 %            Scores of 19 and below usually indicate a poorer quality of life in these areas.  A difference of  2-3 points is a clinically meaningful difference.  A difference  of 2-3 points in the total score of the Quality of Life Index has been associated with significant improvement in overall quality of life, self-image, physical symptoms, and general health in studies assessing change in quality of life.  PHQ-9: Review Flowsheet       02/02/2022  Depression screen PHQ 2/9  Decreased Interest 0  Down, Depressed, Hopeless 0  PHQ - 2 Score 0  Altered sleeping 0  Tired, decreased energy 0  Change in appetite 0  Feeling bad or failure about yourself  0  Trouble concentrating 0  Moving slowly or fidgety/restless 0  Suicidal thoughts 0  PHQ-9 Score 0  Difficult doing work/chores Not difficult at all   Interpretation of Total Score  Total Score Depression Severity:  1-4 = Minimal depression, 5-9 = Mild depression, 10-14 = Moderate depression, 15-19 = Moderately severe depression, 20-27 = Severe depression   Psychosocial Evaluation and Intervention:  Psychosocial Evaluation - 01/03/22 0959       Psychosocial Evaluation & Interventions   Interventions Encouraged to exercise with the program and follow exercise prescription;Relaxation education;Stress management education    Comments He can look to his wife for support. He has not done a cardiac rehab program before. He can also look to his daughter and four grandkids for support.    Expected Outcomes Short: Start [facility] to help with mood. Long: Maintain a healthy mental state    Continue Psychosocial Services  Follow up required by staff             Psychosocial Re-Evaluation:  Psychosocial Re-Evaluation     Row Name 02/23/22 0824 04/04/22 0811            Psychosocial Re-Evaluation   Current issues with -- None Identified      Comments Cecilia is off to a good start in rehab.  His wife is his biggest supporter and usually goes to gym while he is in class so they can exercise together.  He denies any major stressors currently.  He sleeps well for most part, but does need to get up to go to bathroom during the night on occassion. Patient reports no issues with their current mental states, sleep, stress, depression or anxiety. Will follow up with patient in a few weeks for any changes.      Expected Outcomes Short: Conitnue to exercise for mental boost Long: continue to stay positive Short: Continue to exercise regularly to support mental health and notify staff of any changes. Long: maintain mental health and well being through teaching of rehab or prescribed medications independently.      Interventions Encouraged to attend Cardiac Rehabilitation for the exercise Encouraged to attend Cardiac Rehabilitation for the exercise      Continue Psychosocial Services  Follow up required by staff Follow up required by staff               Psychosocial Discharge (Final Psychosocial Re-Evaluation):  Psychosocial Re-Evaluation - 04/04/22 0811       Psychosocial Re-Evaluation   Current issues with None Identified    Comments Patient reports no issues with their current mental states, sleep, stress, depression or anxiety. Will follow up with patient in a few weeks for any changes.    Expected Outcomes Short: Continue to exercise regularly to support mental health and notify staff of any changes. Long: maintain mental health and well being through teaching of rehab or prescribed medications independently.    Interventions Encouraged to attend Cardiac Rehabilitation for the exercise  Continue Psychosocial Services  Follow up required by staff             Vocational Rehabilitation: Provide vocational rehab assistance to qualifying candidates.    Vocational Rehab Evaluation & Intervention:   Education: Education Goals: Education classes will be provided on a variety of topics geared toward better understanding of heart health and risk factor modification. Participant will state understanding/return demonstration of topics presented as noted by education test scores.  Learning Barriers/Preferences:  Learning Barriers/Preferences - 01/03/22 0956       Learning Barriers/Preferences   Learning Barriers None    Learning Preferences None             General Cardiac Education Topics:  AED/CPR: - Group verbal and written instruction with the use of models to demonstrate the basic use of the AED with the basic ABC's of resuscitation.   Anatomy and Cardiac Procedures: - Group verbal and visual presentation and models provide information about basic cardiac anatomy and function. Reviews the testing methods done to diagnose heart disease and the outcomes of the test results. Describes the treatment choices: Medical Management, Angioplasty, or Coronary Bypass Surgery for treating various heart conditions including Myocardial Infarction, Angina, Valve Disease, and Cardiac Arrhythmias.  Written material given at graduation. Flowsheet Row Cardiac Rehab from 04/06/2022 in [facility] Cardiac and Pulmonary Rehab  Date 03/30/22  Educator SB  Instruction Review Code 1- Verbalizes Understanding       Medication Safety: - Group verbal and visual instruction to review commonly prescribed medications for heart and lung disease. Reviews the medication, class of the drug, and side effects. Includes the steps to properly store meds and maintain the prescription regimen.  Written material given at graduation. Flowsheet Row Cardiac Rehab from 04/06/2022 in [facility] Cardiac and Pulmonary Rehab  Education need identified 02/02/22       Intimacy: - Group verbal instruction through game format to discuss how heart and lung disease can affect sexual intimacy.  Written material given at graduation.. Flowsheet Row Cardiac Rehab from 04/06/2022 in [facility] Cardiac and Pulmonary Rehab  Date 03/16/22  Educator [facility]  Instruction Review Code 1- Verbalizes Understanding       Know Your Numbers and Heart Failure: - Group verbal and visual instruction to discuss disease risk factors for cardiac and pulmonary disease and treatment options.  Reviews associated critical values for Overweight/Obesity, Hypertension, Cholesterol, and Diabetes.  Discusses basics of heart failure: signs/symptoms and treatments.  Introduces Heart Failure Zone chart for action plan for heart failure.  Written material given at graduation. Flowsheet Row Cardiac Rehab from 04/06/2022 in [facility] Cardiac and Pulmonary Rehab  Date 02/16/22  Educator SB  Instruction Review Code 1- Verbalizes Understanding       Infection Prevention: - Provides verbal and written material to individual with discussion of infection control including proper hand washing and proper equipment cleaning during exercise session. Flowsheet Row Cardiac Rehab from 04/06/2022 in [facility] Cardiac and Pulmonary Rehab  Date 01/03/22  Educator [facility]  Instruction Review Code 1- Verbalizes Understanding       Falls Prevention: - Provides verbal and written material to individual with discussion of falls prevention and safety. Flowsheet Row Cardiac Rehab from 04/06/2022 in [facility] Cardiac and Pulmonary Rehab  Date 01/03/22  Educator [facility]  Instruction Review Code 1- Verbalizes Understanding       Other: -Provides group and verbal instruction on various topics (see comments)   Knowledge Questionnaire Score:  Knowledge Questionnaire Score - 02/02/22 0945  Knowledge Questionnaire Score   Pre Score 24/26             Core Components/Risk Factors/Patient Goals at Admission:  Personal Goals and Risk Factors at Admission - 02/02/22 1115       Core Components/Risk Factors/Patient Goals on Admission    Weight Management  Yes;Weight Maintenance   Used to be 200lb, lost weight to ~168, gained up to 184 lb the last months   Intervention Weight Management: Develop a combined nutrition and exercise program designed to reach desired caloric intake, while maintaining appropriate intake of nutrient and fiber, sodium and fats, and appropriate energy expenditure required for the weight goal.;Weight Management: Provide education and appropriate resources to help participant work on and attain dietary goals.;Weight Management/Obesity: Establish reasonable short term and long term weight goals.    Admit Weight 184 lb (83.5 kg)    Goal Weight: Short Term 184 lb (83.5 kg)    Goal Weight: Long Term 184 lb (83.5 kg)    Expected Outcomes Short Term: Continue to assess and modify interventions until short term weight is achieved;Long Term: Adherence to nutrition and physical activity/exercise program aimed toward attainment of established weight goal;Weight Maintenance: Understanding of the daily nutrition guidelines, which includes 25-35% calories from fat, 7% or less cal from saturated fats, less than 264m cholesterol, less than 1.5gm of sodium, & 5 or more servings of fruits and vegetables daily;Understanding recommendations for meals to include 15-35% energy as protein, 25-35% energy from fat, 35-60% energy from carbohydrates, less than 2065mof dietary cholesterol, 20-35 gm of total fiber daily;Understanding of distribution of calorie intake throughout the day with the consumption of 4-5 meals/snacks    Tobacco Cessation Yes    Number of packs per day 0. Quit on 10/10/21    Intervention Assist the participant in steps to quit. Provide individualized education and counseling about committing to Tobacco Cessation, relapse prevention, and pharmacological support that can be provided by physician.;OfAdvice workerassist with locating and accessing local/national Quit Smoking programs, and support quit date choice.    Expected  Outcomes Short Term: Will demonstrate readiness to quit, by selecting a quit date.;Short Term: Will quit all tobacco product use, adhering to prevention of relapse plan.;Long Term: Complete abstinence from all tobacco products for at least 12 months from quit date.    Hypertension Yes    Intervention Provide education on lifestyle modifcations including regular physical activity/exercise, weight management, moderate sodium restriction and increased consumption of fresh fruit, vegetables, and low fat dairy, alcohol moderation, and smoking cessation.;Monitor prescription use compliance.    Expected Outcomes Short Term: Continued assessment and intervention until BP is < 140/9026mG in hypertensive participants. < 130/64m46m in hypertensive participants with diabetes, heart failure or chronic kidney disease.;Long Term: Maintenance of blood pressure at goal levels.    Lipids Yes    Intervention Provide education and support for participant on nutrition & aerobic/resistive exercise along with prescribed medications to achieve LDL <70mg72mL >40mg.59mExpected Outcomes Short Term: Participant states understanding of desired cholesterol values and is compliant with medications prescribed. Participant is following exercise prescription and nutrition guidelines.;Long Term: Cholesterol controlled with medications as prescribed, with individualized exercise RX and with personalized nutrition plan. Value goals: LDL < 70mg, 65m> 40 mg.             Education:Diabetes - Individual verbal and written instruction to review signs/symptoms of diabetes, desired ranges of glucose level fasting, after meals and with exercise. Acknowledge  that pre and post exercise glucose checks will be done for 3 sessions at entry of program.   Core Components/Risk Factors/Patient Goals Review:   Goals and Risk Factor Review     Row Name 02/23/22 0904 04/04/22 0807           Core Components/Risk Factors/Patient Goals Review    Personal Goals Review Weight Management/Obesity;Hypertension;Lipids;Tobacco Cessation Tobacco Cessation      Review Crockett is off to a good start in rehab. He is trying to gain weight by building muscle back up.  He is up some but was encouraged to make sure he is eating protein.  He has an appointment on Monday with dietitian.  He is still not smoking.  Blood pressures are doing well and he continues to check them at home. Riyan has not smoked since April. He does not want to smoke because he knows it will affect his health. Just the knowing that it is bad for his health was enough for him to stop smoking. He quit without medication or alternate smoking routes.      Expected Outcomes Short: Continue to work on muscle gain Long: Continue to monitor risk factors Short: be tobacco free for one more month. Long: be tobacco free independently.               Core Components/Risk Factors/Patient Goals at Discharge (Final Review):   Goals and Risk Factor Review - 04/04/22 0807       Core Components/Risk Factors/Patient Goals Review   Personal Goals Review Tobacco Cessation    Review Redell has not smoked since April. He does not want to smoke because he knows it will affect his health. Just the knowing that it is bad for his health was enough for him to stop smoking. He quit without medication or alternate smoking routes.    Expected Outcomes Short: be tobacco free for one more month. Long: be tobacco free independently.             ITP Comments:  ITP Comments     Row Name 01/03/22 0955 02/02/22 0943 02/14/22 0836 02/16/22 0636 02/28/22 0954   ITP Comments Virtual Visit completed. Patient informed on EP and RD appointment and 6 Minute walk test. Patient also informed of patient health questionnaires on My Chart. Patient Verbalizes understanding. Visit diagnosis can be found in [facility] 10/12/2021. Completed 6MWT and gym orientation. Initial ITP created and sent for review to Dr. Avelina Laine,  Medical Director. First full day of exercise!  Patient was oriented to gym and equipment including functions, settings, policies, and procedures.  Patient's individual exercise prescription and treatment plan were reviewed.  All starting workloads were established based on the results of the 6 minute walk test done at initial orientation visit.  The plan for exercise progression was also introduced and progression will be customized based on patient's performance and goals. 30 Day review completed. Medical Director ITP review done, changes made as directed, and signed approval by Medical Director.    NEW Completed initial RD consultation    [place] Name 03/16/22 0725 04/13/22 0645         ITP Comments 30 Day review completed. Medical Director ITP review done, changes made as directed, and signed approval by Medical Director. 30 Day review completed. Medical Director ITP review done, changes made as directed, and signed approval by Medical Director.               Comments:

## 2022-04-13 NOTE — Progress Notes (Signed)
Daily Session Note  Patient Details  Name: [name] MRN: [id number] Date of Birth: 08/10/1955 Referring Provider:   Flowsheet Row Cardiac Rehab from 02/02/2022 in [facility] Cardiac and Pulmonary Rehab  Referring Provider Isaias Cowman MD       Encounter Date: 04/13/2022  Check In:  Session Check In - 04/13/22 0820       Check-In   Supervising physician immediately available to respond to emergencies See telemetry face sheet for immediately available ER MD    Location [facility]-Cardiac & Pulmonary Rehab    Staff Present Antionette Fairy, BS, Exercise Physiologist;Joseph Rosebud Poles, RN, [place]    Virtual Visit No    Medication changes reported     No    Fall or balance concerns reported    No    Warm-up and Cool-down Performed on first and last piece of equipment    Resistance Training Performed Yes    VAD Patient? No    PAD/SET Patient? No      Pain Assessment   Currently in Pain? No/denies                Social History   Tobacco Use  Smoking Status Former   Packs/day: 1.00   Years: 46.00   Total pack years: 46.00   Types: Cigarettes   Quit date: 10/09/2021   Years since quitting: 0.5  Smokeless Tobacco Never    Goals Met:  Independence with exercise equipment Exercise tolerated well No report of concerns or symptoms today Strength training completed today  Goals Unmet:  Not Applicable  Comments: Pt able to follow exercise prescription today without complaint.  Will continue to monitor for progression.   Yoe Name 02/02/22 1111         6 Minute Walk   Phase Initial     Distance 1410 feet     Walk Time 6 minutes     # of Rest Breaks 0     MPH 2.67     METS 3.57     RPE 9     Perceived Dyspnea  0     VO2 Peak 12.51     Symptoms No     Resting HR 62 bpm     Resting BP 126/70     Resting Oxygen Saturation  98 %     Exercise Oxygen Saturation  during 6 min walk 99 %     Max Ex. HR 92 bpm     Max Ex. BP 142/68      2 Minute Post BP 132/70                Dr. Emily Filbert is Medical Director for [place].  Dr. Ottie Glazier is Medical Director for [facility] Pulmonary Rehabilitation.

## 2022-04-15 ENCOUNTER — Encounter: Payer: PPO | Admitting: *Deleted

## 2022-04-15 DIAGNOSIS — Z952 Presence of prosthetic heart valve: Secondary | ICD-10-CM

## 2022-04-15 NOTE — Progress Notes (Signed)
Daily Session Note  Patient Details  Name: [name] MRN: [id number] Date of Birth: 02-12-1956 Referring Provider:   Flowsheet Row Cardiac Rehab from 02/02/2022 in [facility] Cardiac and Pulmonary Rehab  Referring Provider Isaias Cowman MD       Encounter Date: 04/15/2022  Check In:  Session Check In - 04/15/22 0840       Check-In   Supervising physician immediately available to respond to emergencies See telemetry face sheet for immediately available ER MD    Location [facility]-Cardiac & Pulmonary Rehab    Staff Present Heath Lark, RN, BSN, CCRP;Jessica [place], MA, RCEP, CCRP, CCET;Joseph [place], [place]    Virtual Visit No    Medication changes reported     No    Fall or balance concerns reported    No    Tobacco Cessation No Change    Current number of cigarettes/nicotine per day     0    Warm-up and Cool-down Performed on first and last piece of equipment    Resistance Training Performed Yes    VAD Patient? No    PAD/SET Patient? No      Pain Assessment   Currently in Pain? No/denies                Social History   Tobacco Use  Smoking Status Former   Packs/day: 1.00   Years: 46.00   Total pack years: 46.00   Types: Cigarettes   Quit date: 10/09/2021   Years since quitting: 0.5  Smokeless Tobacco Never    Goals Met:  Independence with exercise equipment Exercise tolerated well No report of concerns or symptoms today  Goals Unmet:  Not Applicable  Comments: Pt able to follow exercise prescription today without complaint.  Will continue to monitor for progression.    Dr. Emily Filbert is Medical Director for [place].  Dr. Ottie Glazier is Medical Director for [facility] Pulmonary Rehabilitation.

## 2022-04-18 ENCOUNTER — Encounter: Payer: PPO | Admitting: *Deleted

## 2022-04-18 DIAGNOSIS — Z952 Presence of prosthetic heart valve: Secondary | ICD-10-CM | POA: Diagnosis not present

## 2022-04-18 NOTE — Progress Notes (Signed)
Daily Session Note  Patient Details  Name: [name] MRN: [id number] Date of Birth: 01/06/1956 Referring Provider:   Flowsheet Row Cardiac Rehab from 02/02/2022 in [facility] Cardiac and Pulmonary Rehab  Referring Provider Paraschos, Alexander MD       Encounter Date: 04/18/2022  Check In:  Session Check In - 04/18/22 0804       Check-In   Supervising physician immediately available to respond to emergencies See telemetry face sheet for immediately available ER MD    Location [facility]-Cardiac & Pulmonary Rehab    Staff Present Kelly Hayes, BS, ACSM CEP, Exercise Physiologist;Joseph Hood, RCP,RRT,BSRT;Megan Smith, RN, ADN    Virtual Visit No    Medication changes reported     No    Fall or balance concerns reported    No    Warm-up and Cool-down Performed on first and last piece of equipment    Resistance Training Performed Yes    VAD Patient? No    PAD/SET Patient? No      Pain Assessment   Currently in Pain? No/denies                Social History   Tobacco Use  Smoking Status Former   Packs/day: 1.00   Years: 46.00   Total pack years: 46.00   Types: Cigarettes   Quit date: 10/09/2021   Years since quitting: 0.5  Smokeless Tobacco Never    Goals Met:  Independence with exercise equipment Exercise tolerated well No report of concerns or symptoms today Strength training completed today  Goals Unmet:  Not Applicable  Comments: Pt able to follow exercise prescription today without complaint.  Will continue to monitor for progression.    Dr. Mark Miller is Medical Director for [facility].  Dr. Fuad Aleskerov is Medical Director for [facility] Pulmonary Rehabilitation. 

## 2022-04-20 ENCOUNTER — Encounter: Payer: PPO | Admitting: *Deleted

## 2022-04-20 DIAGNOSIS — Z952 Presence of prosthetic heart valve: Secondary | ICD-10-CM

## 2022-04-20 NOTE — Progress Notes (Signed)
Cardiac Individual Treatment Plan  Patient Details  Name: [name] MRN: [id number] Date of Birth: 03-13-1956 Referring Provider:   Flowsheet Row Cardiac Rehab from 02/02/2022 in [facility] Cardiac and Pulmonary Rehab  Referring Provider Isaias Cowman MD       Initial Encounter Date:  Flowsheet Row Cardiac Rehab from 02/02/2022 in [facility] Cardiac and Pulmonary Rehab  Date 02/02/22       Visit Diagnosis: S/P aortic valve replacement  Patient's Home Medications on Admission:  Current Outpatient Medications:    amLODipine (NORVASC) 10 MG tablet, Take 10 mg by mouth daily., Disp: , Rfl:    aspirin EC 81 MG tablet, Take 81 mg by mouth daily. Swallow whole., Disp: , Rfl:    atorvastatin (LIPITOR) 80 MG tablet, Take 1 tablet (80 mg total) by mouth daily. (Patient not taking: Reported on 01/03/2022), Disp: , Rfl:    Multiple Vitamins-Minerals (MULTIVITAMIN WITH MINERALS) tablet, Take 1 tablet by mouth daily., Disp: , Rfl:    prednisoLONE acetate (PRED FORTE) 1 % ophthalmic suspension, Place 1 drop into the right eye 6 (six) times daily., Disp: , Rfl:   Past Medical History: Past Medical History:  Diagnosis Date   Hypertension     Tobacco Use: Social History   Tobacco Use  Smoking Status Former   Packs/day: 1.00   Years: 46.00   Total pack years: 46.00   Types: Cigarettes   Quit date: 10/09/2021   Years since quitting: 0.5  Smokeless Tobacco Never    Labs: Review Flowsheet       Latest Ref Rng & Units 10/12/2021  Labs for ITP Cardiac and Pulmonary Rehab  Cholestrol 0 - 200 mg/dL 132   LDL (calc) 0 - 99 mg/dL 90   HDL-C >40 mg/dL 17   Trlycerides <150 mg/dL 124   Hemoglobin A1c 4.8 - 5.6 % 5.7      Exercise Target Goals: Exercise Program Goal: Individual exercise prescription set using results from initial 6 min walk test and THRR while considering  patient's activity barriers and safety.   Exercise Prescription Goal: Initial exercise prescription builds to 30-45  minutes a day of aerobic activity, 2-3 days per week.  Home exercise guidelines will be given to patient during program as part of exercise prescription that the participant will acknowledge.   Education: Aerobic Exercise: - Group verbal and visual presentation on the components of exercise prescription. Introduces F.I.T.T principle from ACSM for exercise prescriptions.  Reviews F.I.T.T. principles of aerobic exercise including progression. Written material given at graduation. Flowsheet Row Cardiac Rehab from 04/13/2022 in [facility] Cardiac and Pulmonary Rehab  Date 03/16/22  Educator [facility]  Instruction Review Code 1- Verbalizes Understanding       Education: Resistance Exercise: - Group verbal and visual presentation on the components of exercise prescription. Introduces F.I.T.T principle from ACSM for exercise prescriptions  Reviews F.I.T.T. principles of resistance exercise including progression. Written material given at graduation.    Education: Exercise & Equipment Safety: - Individual verbal instruction and demonstration of equipment use and safety with use of the equipment. Flowsheet Row Cardiac Rehab from 04/13/2022 in [facility] Cardiac and Pulmonary Rehab  Date 01/03/22  Educator [place]  Instruction Review Code 1- Verbalizes Understanding       Education: Exercise Physiology & General Exercise Guidelines: - Group verbal and written instruction with models to review the exercise physiology of the cardiovascular system and associated critical values. Provides general exercise guidelines with specific guidelines to those with heart or lung disease.  Flowsheet Row  Cardiac Rehab from 04/13/2022 in [facility] Cardiac and Pulmonary Rehab  Education need identified 02/02/22  Date 03/09/22  Educator [facility]  Instruction Review Code 1- [employer] Understanding       Education: Flexibility, Balance, Mind/Body Relaxation: - Group verbal and visual presentation with interactive activity on the components of  exercise prescription. Introduces F.I.T.T principle from ACSM for exercise prescriptions. Reviews F.I.T.T. principles of flexibility and balance exercise training including progression. Also discusses the mind body connection.  Reviews various relaxation techniques to help reduce and manage stress (i.e. Deep breathing, progressive muscle relaxation, and visualization). Balance handout provided to take home. Written material given at graduation. Flowsheet Row Cardiac Rehab from 04/13/2022 in [facility] Cardiac and Pulmonary Rehab  Date 03/23/22  Educator NT  Instruction Review Code 1- Verbalizes Understanding       Activity Barriers & Risk Stratification:  Activity Barriers & Cardiac Risk Stratification - 02/02/22 1104       Activity Barriers & Cardiac Risk Stratification   Activity Barriers Muscular Weakness    Cardiac Risk Stratification Moderate             6 Minute Walk:  6 Minute Walk     Row Name 02/02/22 1111 04/13/22 0827       6 Minute Walk   Phase Initial Discharge    Distance 1410 feet 1810 feet    Distance % Change -- 28.3 %    Distance Feet Change -- 400 ft    Walk Time 6 minutes 6 minutes    # of Rest Breaks 0 0    MPH 2.67 3.43    METS 3.57 4.64    RPE 9 14    Perceived Dyspnea  0 0    VO2 Peak 12.51 16.23    Symptoms No No    Resting HR 62 bpm 79 bpm    Resting BP 126/70 138/70    Resting Oxygen Saturation  98 % 96 %    Exercise Oxygen Saturation  during 6 min walk 99 % 98 %    Max Ex. HR 92 bpm 120 bpm    Max Ex. BP 142/68 168/74    2 Minute Post BP 132/70 146/68             Oxygen Initial Assessment:   Oxygen Re-Evaluation:   Oxygen Discharge (Final Oxygen Re-Evaluation):   Initial Exercise Prescription:  Initial Exercise Prescription - 02/02/22 1100       Date of Initial Exercise RX and Referring Provider   Date 02/02/22    Referring Provider Paraschos, Alexander MD      Oxygen   Maintain Oxygen Saturation 88% or higher       Treadmill   MPH 2.7    Grade 0.5    Minutes 15    METs 3.25      NuStep   Level 3    SPM 80    Minutes 15    METs 3.5      REL-XR   Level 2    Speed 50    Minutes 15    METs 3.5      T5 Nustep   Level 2    SPM 80    Minutes 15    METs 3.5      Prescription Details   Frequency (times per week) 99 - 136    Duration Progress to 30 minutes of continuous aerobic without signs/symptoms of physical distress      Intensity   THRR 40-80% of Max  Heartrate 99 - 136    Ratings of Perceived Exertion 11-13    Perceived Dyspnea 0-4      Progression   Progression Continue to progress workloads to maintain intensity without signs/symptoms of physical distress.      Resistance Training   Training Prescription Yes    Weight 5 lb    Reps 10-15             Perform Capillary Blood Glucose checks as needed.  Exercise Prescription Changes:   Exercise Prescription Changes     Row Name 02/02/22 1100 02/23/22 0800 03/15/22 0900 03/29/22 1400 04/11/22 1500     Response to Exercise   Blood Pressure (Admit) 126/70 -- 112/60 122/76 122/60   Blood Pressure (Exercise) 142/68 -- -- -- --   Blood Pressure (Exit) 132/70 -- 124/62 112/62 128/64   Heart Rate (Admit) 62 bpm -- 80 bpm 73 bpm 69 bpm   Heart Rate (Exercise) 92 bpm -- 131 bpm 126 bpm 113 bpm   Heart Rate (Exit) 69 bpm -- 87 bpm 86 bpm 110 bpm   Oxygen Saturation (Admit) 98 % -- -- -- 96 %   Oxygen Saturation (Exercise) 99 % -- -- -- 95 %   Oxygen Saturation (Exit) 99 % -- -- -- 95 %   Rating of Perceived Exertion (Exercise) 9 -- _0 Perceived Dyspnea (Exercise) 0 -- -- -- --   Symptoms none -- none none none   Comments walk test results -- -- -- --   Duration -- -- Continue with 30 min of aerobic exercise without signs/symptoms of physical distress. Continue with 30 min of aerobic exercise without signs/symptoms of physical distress. Continue with 30 min of aerobic exercise without signs/symptoms of physical  distress.   Intensity -- -- THRR unchanged THRR unchanged THRR unchanged     Progression   Progression -- -- Continue to progress workloads to maintain intensity without signs/symptoms of physical distress. Continue to progress workloads to maintain intensity without signs/symptoms of physical distress. Continue to progress workloads to maintain intensity without signs/symptoms of physical distress.   Average METs -- -- 4.12 5.14 4.69     Resistance Training   Training Prescription -- -- Yes Yes Yes   Weight -- -- 5 lb 5 lb 4 lb   Reps -- -- 10-15 10-15 10-15     Interval Training   Interval Training -- -- No No No     Treadmill   MPH -- -- 3.3 3 2.8   Grade -- -- 7._1 Minutes -- -- _2 METs -- -- 6.9 6.61 5.07     Recumbant Bike   Level -- -- 1 -- --   Minutes -- -- 15 -- --   METs -- -- 2.91 -- --     NuStep   Level -- -- _3 Minutes -- -- _4 METs -- -- 4.2 4.6 5.6     T5 Nustep   Level -- -- _5 Minutes -- -- _6 METs -- -- 2.2 3.2 3     Home Exercise Plan   Plans to continue exercise at -- Home (comment)  walking, [facility] (comment)  walking, [facility] (comment)  walking, [facility] (comment)  walking, [employer]   Frequency -- Add 3 additional days to program exercise sessions. Add 3 additional days to program exercise sessions. Add  3 additional days to program exercise sessions. Add 3 additional days to program exercise sessions.   Initial Home Exercises Provided -- 02/23/22 02/23/22 02/23/22 02/23/22     Oxygen   Maintain Oxygen Saturation -- -- 88% or higher 88% or higher 88% or higher            Exercise Comments:   Exercise Comments     Row Name 02/14/22 0836           Exercise Comments First full day of exercise!  Patient was oriented to gym and equipment including functions, settings, policies, and procedures.  Patient's individual exercise prescription and treatment plan were reviewed.  All starting  workloads were established based on the results of the 6 minute walk test done at initial orientation visit.  The plan for exercise progression was also introduced and progression will be customized based on patient's performance and goals.                Exercise Goals and Review:   Exercise Goals     Row Name 02/02/22 1114             Exercise Goals   Increase Physical Activity Yes       Intervention Develop an individualized exercise prescription for aerobic and resistive training based on initial evaluation findings, risk stratification, comorbidities and participant's personal goals.;Provide advice, education, support and counseling about physical activity/exercise needs.       Expected Outcomes Short Term: Attend rehab on a regular basis to increase amount of physical activity.;Long Term: Exercising regularly at least 3-5 days a week.;Long Term: Add in home exercise to make exercise part of routine and to increase amount of physical activity.       Increase Strength and Stamina Yes       Intervention Provide advice, education, support and counseling about physical activity/exercise needs.;Develop an individualized exercise prescription for aerobic and resistive training based on initial evaluation findings, risk stratification, comorbidities and participant's personal goals.       Expected Outcomes Short Term: Increase workloads from initial exercise prescription for resistance, speed, and METs.;Short Term: Perform resistance training exercises routinely during rehab and add in resistance training at home;Long Term: Improve cardiorespiratory fitness, muscular endurance and strength as measured by increased METs and functional capacity (6MWT)       Able to understand and use rate of perceived exertion (RPE) scale Yes       Intervention Provide education and explanation on how to use RPE scale       Expected Outcomes Short Term: Able to use RPE daily in rehab to express subjective  intensity level;Long Term:  Able to use RPE to guide intensity level when exercising independently       Able to understand and use Dyspnea scale Yes       Intervention Provide education and explanation on how to use Dyspnea scale       Expected Outcomes Short Term: Able to use Dyspnea scale daily in rehab to express subjective sense of shortness of breath during exertion;Long Term: Able to use Dyspnea scale to guide intensity level when exercising independently       Knowledge and understanding of Target Heart Rate Range (THRR) Yes       Intervention Provide education and explanation of THRR including how the numbers were predicted and where they are located for reference       Expected Outcomes Short Term: Able to state/look up THRR;Long Term: Able to use THRR  to govern intensity when exercising independently;Short Term: Able to use daily as guideline for intensity in rehab       Able to check pulse independently Yes       Intervention Provide education and demonstration on how to check pulse in carotid and radial arteries.;Review the importance of being able to check your own pulse for safety during independent exercise       Expected Outcomes Long Term: Able to check pulse independently and accurately;Short Term: Able to explain why pulse checking is important during independent exercise       Understanding of Exercise Prescription Yes       Intervention Provide education, explanation, and written materials on patient's individual exercise prescription       Expected Outcomes Short Term: Able to explain program exercise prescription;Long Term: Able to explain home exercise prescription to exercise independently                Exercise Goals Re-Evaluation :  Exercise Goals Re-Evaluation     Row Name 02/14/22 0836 02/23/22 0856 03/15/22 0948 03/29/22 1427 04/11/22 1531     Exercise Goal Re-Evaluation   Exercise Goals Review Able to understand and use rate of perceived exertion (RPE)  scale;Knowledge and understanding of Target Heart Rate Range (THRR);Understanding of Exercise Prescription;Able to understand and use Dyspnea scale Able to understand and use rate of perceived exertion (RPE) scale;Knowledge and understanding of Target Heart Rate Range (THRR);Understanding of Exercise Prescription;Able to understand and use Dyspnea scale;Increase Physical Activity;Increase Strength and Stamina;Able to check pulse independently Increase Physical Activity;Increase Strength and Stamina;Understanding of Exercise Prescription Increase Physical Activity;Increase Strength and Stamina;Understanding of Exercise Prescription Increase Physical Activity;Increase Strength and Stamina;Understanding of Exercise Prescription   Comments Reviewed RPE and dyspnea scales, THR and program prescription with pt today.  Pt voiced understanding and was given a copy of goals to take home. Dequavius is off to a good start in rehab.  We talked about needing more than just three days a week.  Reviewed home exercise with pt today.  Pt plans to walk and go back to [facility] for exercise.  Reviewed THR, pulse, RPE, sign and symptoms, pulse oximetery and when to call 911 or MD.  Also discussed weather considerations and indoor options.  Pt voiced understanding. Jurgen is doing well since he started rehab. He has already increased his workloads on his machines quite a bit. He is walking at a 3.3 mph/ 7.5% incline on the treadmill, almost at 7 METS! He has alrso increased to level 5 on the T4 Nustep. It appears he tried the recumbent bike at level 1, and shuld up his workload on it for next time. He is hitting his THR each session. We will continue to monitor. Genaro continues to do well in rehab. He recently improved his overall average MET level to 5.14 METs. He also increased his incline on the treadmill to 8% while maintaining a speed of 3 mph. Harrell improved to level 4 on the T5 as well. We will continue to monitor his progress  in the program. Aydenn is doing well in rehab.  He dropped on his weights and treadmill speed.  We will encourage him to return to consistent workloads.  We will continue to monitor his progress.   Expected Outcomes Short: Use RPE daily to regulate intensity. Long: Follow program prescription in THR. Short: Start to add in more exercise at home Long: Conitnue to improve stamina Short: Increase workload on recumbent bike Long: Continue to increase  overall MET level Short: Continue to increase workloads Long: Continue to increase strength and stamina Short: Aim for consistent workloads Long: Conitnue to improve stamina            Discharge Exercise Prescription (Final Exercise Prescription Changes):  Exercise Prescription Changes - 04/11/22 1500       Response to Exercise   Blood Pressure (Admit) 122/60    Blood Pressure (Exit) 128/64    Heart Rate (Admit) 69 bpm    Heart Rate (Exercise) 113 bpm    Heart Rate (Exit) 110 bpm    Oxygen Saturation (Admit) 96 %    Oxygen Saturation (Exercise) 95 %    Oxygen Saturation (Exit) 95 %    Rating of Perceived Exertion (Exercise) 13    Symptoms none    Duration Continue with 30 min of aerobic exercise without signs/symptoms of physical distress.    Intensity THRR unchanged      Progression   Progression Continue to progress workloads to maintain intensity without signs/symptoms of physical distress.    Average METs 4.69      Resistance Training   Training Prescription Yes    Weight 4 lb    Reps 10-15      Interval Training   Interval Training No      Treadmill   MPH 2.8    Grade 5    Minutes 15    METs 5.07      NuStep   Level 5    Minutes 15    METs 5.6      T5 Nustep   Level 5    Minutes 15    METs 3      Home Exercise Plan   Plans to continue exercise at Home (comment)   walking, [employer]   Frequency Add 3 additional days to program exercise sessions.    Initial Home Exercises Provided 02/23/22      Oxygen    Maintain Oxygen Saturation 88% or higher             Nutrition:  Target Goals: Understanding of nutrition guidelines, daily intake of sodium <1512m, cholesterol <2065m calories 30% from fat and 7% or less from saturated fats, daily to have 5 or more servings of fruits and vegetables.  Education: All About Nutrition: -Group instruction provided by verbal, written material, interactive activities, discussions, models, and posters to present general guidelines for heart healthy nutrition including fat, fiber, MyPlate, the role of sodium in heart healthy nutrition, utilization of the nutrition label, and utilization of this knowledge for meal planning. Follow up email sent as well. Written material given at graduation. Flowsheet [facility]  Date 04/06/22  [facility]- Verbalizes Understanding       Biometrics:  Pre Biometrics - 02/02/22 1106       Pre Biometrics   Height 5' 11.5" (1.816 m)    Weight 184 lb 3.2 oz (83.6 kg)   Was ~ 168 lb 1 month ago, confirmed with patient.   BMI (Calculated) 25.34    Single Leg Stand 22.4 seconds             Post Biometrics - 04/13/22 0828        Post  Biometrics   Height 5' 11.5" (1.816 m)    Weight 201 lb 14.4 oz (91.6 kg)    BMI (Calculated) 27.77    Single Leg Stand 30 seconds  Nutrition Therapy Plan and Nutrition Goals:  Nutrition Therapy & Goals - 02/28/22 0845       Nutrition Therapy   Diet Heart healthy, low Na    Protein (specify units) 65g    Fiber 30 grams    Whole Grain Foods 3 servings    Saturated Fats 16 max. grams    Fruits and Vegetables 8 servings/day    Sodium 2 grams      Personal Nutrition Goals   Nutrition Goal ST: practice MyPlate structure  LT: follow MyPlate guidelines, eat at least 5 fruit/vegetable servings per day, limit saturated fat <16g/day    Comments 66 y.o. M admitted to cardiac rehab s/p  aortic valve replacement. PMHx inlcudes bicuspid aortic valve, HTN, CKD, HLD. Relevant medications includes MVI with minerals. PYP Score: 51.  Vegetables & Fruits 5/12. Breads, Grains & Cereals 9/12. Red & Processed Meat 10/12. Poultry 0/2. Fish & Shellfish 1/4. Beans, Nuts & Seeds 2/4. Milk & Dairy Foods 3/6. Toppings, Oils, Seasonings & Salt 12/20. Sweets, Snacks & Restaurant Food 5/14. Beverages 4/10.  They report going out to eat about 3x/week. B: bacon, eggs, toast (whole wheat), grits (butter, splenda) or sometimes oatmeal (butter). S: chips, cookie, cake, fruit, etc. L: vaires: baked chicken with vegetables, fried chicken, sandwiches from subway, burgers, fish. S: popcorn, fruit, etc. D: same as lunch ( they will only fry fish, baked chicken in oven or airfyer, fajitas, tomato sandwich, banana sandwich, variety of vegetables (green)). They use vegetable, canola, and olive oil. He reports he will use seasonings which can include salt as well as salt. Discussed general heart healthy eating, Manuel is nervous that he will not be able to structure his meals like the MyPlate guideline; discussed trying to add in more nutrient dense foods to meals and be mindful of MyPlate strucutre.      Intervention Plan   Intervention Prescribe, educate and counsel regarding individualized specific dietary modifications aiming towards targeted core components such as weight, hypertension, lipid management, diabetes, heart failure and other comorbidities.    Expected Outcomes Short Term Goal: Understand basic principles of dietary content, such as calories, fat, sodium, cholesterol and nutrients.;Short Term Goal: A plan has been developed with personal nutrition goals set during dietitian appointment.;Long Term Goal: Adherence to prescribed nutrition plan.             Nutrition Assessments:  MEDIFICTS Score Key: ?70 Need to make dietary changes  40-70 Heart Healthy Diet ? 40 Therapeutic Level Cholesterol  Diet  Flowsheet Row Cardiac Rehab from 04/15/2022 in [facility] Cardiac and Pulmonary Rehab  Picture Your Plate Total Score on Admission 51  Picture Your Plate Total Score on Discharge 51      Picture Your Plate Scores: <20 Unhealthy dietary pattern with much room for improvement. 41-50 Dietary pattern unlikely to meet recommendations for good health and room for improvement. 51-60 More healthful dietary pattern, with some room for improvement.  >60 Healthy dietary pattern, although there may be some specific behaviors that could be improved.    Nutrition Goals Re-Evaluation:  Nutrition Goals Re-Evaluation     [place] Name 02/23/22 0825 04/04/22 0813           Goals   Current Weight -- 200 lb (90.7 kg)      Nutrition Goal Meet with [facility] Monday Reduce Sweets      Comment Meet with [facility] Monday Aubry is doing ok with his diet. He has been eating more sweets than he wants to. Sometimes he  will wake up and eat sweets then go back to bed. He knows that they are alot of calories and not the best for him. He is going to work on reducing his sweet intake.      Expected Outcome Meet with [facility] Monday Short: reduce sweets. Long: keeps sweets at a minumum.               Nutrition Goals Discharge (Final Nutrition Goals Re-Evaluation):  Nutrition Goals Re-Evaluation - 04/04/22 0813       Goals   Current Weight 200 lb (90.7 kg)    Nutrition Goal Reduce Sweets    Comment Ezana is doing ok with his diet. He has been eating more sweets than he wants to. Sometimes he will wake up and eat sweets then go back to bed. He knows that they are alot of calories and not the best for him. He is going to work on reducing his sweet intake.    Expected Outcome Short: reduce sweets. Long: keeps sweets at a minumum.             Psychosocial: Target Goals: Acknowledge presence or absence of significant depression and/or stress, maximize coping skills, provide positive support system. Participant  is able to verbalize types and ability to use techniques and skills needed for reducing stress and depression.   Education: Stress, Anxiety, and Depression - Group verbal and visual presentation to define topics covered.  Reviews how body is impacted by stress, anxiety, and depression.  Also discusses healthy ways to reduce stress and to treat/manage anxiety and depression.  Written material given at graduation. Flowsheet Row Cardiac Rehab from 04/13/2022 in [facility] Cardiac and Pulmonary Rehab  Date 03/02/22  Educator [facility]  Instruction Review Code 1- [employer] Understanding       Education: Sleep Hygiene -Provides group verbal and written instruction about how sleep can affect your health.  Define sleep hygiene, discuss sleep cycles and impact of sleep habits. Review good sleep hygiene tips.    Initial Review & Psychosocial Screening:  Initial Psych Review & Screening - 01/03/22 0958       Initial Review   Current issues with None Identified      Family Dynamics   Good Support System? Yes    Comments He can look to his wife for support. He has not done a cardiac rehab program before. He can also look to his daughter and four grandkids for support.      Barriers   Psychosocial barriers to participate in program The patient should benefit from training in stress management and relaxation.;There are no identifiable barriers or psychosocial needs.      Screening Interventions   Interventions Encouraged to exercise;To provide support and resources with identified psychosocial needs;Provide feedback about the scores to participant    Expected Outcomes Short Term goal: Utilizing psychosocial counselor, staff and physician to assist with identification of specific Stressors or current issues interfering with healing process. Setting desired goal for each stressor or current issue identified.;Long Term Goal: Stressors or current issues are controlled or eliminated.;Short Term goal: Identification  and review with participant of any Quality of Life or Depression concerns found by scoring the questionnaire.;Long Term goal: The participant improves quality of Life and PHQ9 Scores as seen by post scores and/or verbalization of changes             Quality of Life Scores:   Quality of Life - 04/15/22 0829       Quality of Life Scores  Health/Function Pre 24.4 %    Health/Function Post 27.6 %    Health/Function % Change 13.11 %    Socioeconomic Pre 28.13 %    Socioeconomic Post 28.5 %    Socioeconomic % Change  1.32 %    Psych/Spiritual Pre 30 %    Psych/Spiritual Post 29.14 %    Psych/Spiritual % Change -2.87 %    Family Pre 30 %    Family Post 30 %    Family % Change 0 %    GLOBAL Pre 27.17 %    GLOBAL Post 28.46 %    GLOBAL % Change 4.75 %            Scores of 19 and below usually indicate a poorer quality of life in these areas.  A difference of  2-3 points is a clinically meaningful difference.  A difference of 2-3 points in the total score of the Quality of Life Index has been associated with significant improvement in overall quality of life, self-image, physical symptoms, and general health in studies assessing change in quality of life.  PHQ-9: Review Flowsheet       04/15/2022 02/02/2022  Depression screen PHQ 2/9  Decreased Interest 0 0  Down, Depressed, Hopeless 0 0  PHQ - 2 Score 0 0  Altered sleeping 0 0  Tired, decreased energy 0 0  Change in appetite 0 0  Feeling bad or failure about yourself  0 0  Trouble concentrating 0 0  Moving slowly or fidgety/restless 0 0  Suicidal thoughts 0 0  PHQ-9 Score 0 0  Difficult doing work/chores Not difficult at all Not difficult at all   Interpretation of Total Score  Total Score Depression Severity:  1-4 = Minimal depression, 5-9 = Mild depression, 10-14 = Moderate depression, 15-19 = Moderately severe depression, 20-27 = Severe depression   Psychosocial Evaluation and Intervention:  Psychosocial  Evaluation - 01/03/22 0959       Psychosocial Evaluation & Interventions   Interventions Encouraged to exercise with the program and follow exercise prescription;Relaxation education;Stress management education    Comments He can look to his wife for support. He has not done a cardiac rehab program before. He can also look to his daughter and four grandkids for support.    Expected Outcomes Short: Start [facility] to help with mood. Long: Maintain a healthy mental state    Continue Psychosocial Services  Follow up required by staff             Psychosocial Re-Evaluation:  Psychosocial Re-Evaluation     Row Name 02/23/22 0824 04/04/22 0811           Psychosocial Re-Evaluation   Current issues with -- None Identified      Comments Peyten is off to a good start in rehab.  His wife is his biggest supporter and usually goes to gym while he is in class so they can exercise together.  He denies any major stressors currently.  He sleeps well for most part, but does need to get up to go to bathroom during the night on occassion. Patient reports no issues with their current mental states, sleep, stress, depression or anxiety. Will follow up with patient in a few weeks for any changes.      Expected Outcomes Short: Conitnue to exercise for mental boost Long: continue to stay positive Short: Continue to exercise regularly to support mental health and notify staff of any changes. Long: maintain mental health and well being through teaching  of rehab or prescribed medications independently.      Interventions Encouraged to attend Cardiac Rehabilitation for the exercise Encouraged to attend Cardiac Rehabilitation for the exercise      Continue Psychosocial Services  Follow up required by staff Follow up required by staff               Psychosocial Discharge (Final Psychosocial Re-Evaluation):  Psychosocial Re-Evaluation - 04/04/22 0811       Psychosocial Re-Evaluation   Current issues with  None Identified    Comments Patient reports no issues with their current mental states, sleep, stress, depression or anxiety. Will follow up with patient in a few weeks for any changes.    Expected Outcomes Short: Continue to exercise regularly to support mental health and notify staff of any changes. Long: maintain mental health and well being through teaching of rehab or prescribed medications independently.    Interventions Encouraged to attend Cardiac Rehabilitation for the exercise    Continue Psychosocial Services  Follow up required by staff             Vocational Rehabilitation: Provide vocational rehab assistance to qualifying candidates.   Vocational Rehab Evaluation & Intervention:  Vocational Rehab - 04/15/22 0829       Discharge Vocational Rehab   Discharge Vocational Rehabilitation no VR required             Education: Education Goals: Education classes will be provided on a variety of topics geared toward better understanding of heart health and risk factor modification. Participant will state understanding/return demonstration of topics presented as noted by education test scores.  Learning Barriers/Preferences:  Learning Barriers/Preferences - 01/03/22 0956       Learning Barriers/Preferences   Learning Barriers None    Learning Preferences None             General Cardiac Education Topics:  AED/CPR: - Group verbal and written instruction with the use of models to demonstrate the basic use of the AED with the basic ABC's of resuscitation.   Anatomy and Cardiac Procedures: - Group verbal and visual presentation and models provide information about basic cardiac anatomy and function. Reviews the testing methods done to diagnose heart disease and the outcomes of the test results. Describes the treatment choices: Medical Management, Angioplasty, or Coronary Bypass Surgery for treating various heart conditions including Myocardial Infarction, Angina,  Valve Disease, and Cardiac Arrhythmias.  Written material given at graduation. Flowsheet Row Cardiac Rehab from 04/13/2022 in [facility] Cardiac and Pulmonary Rehab  Date 03/30/22  Educator SB  Instruction Review Code 1- Verbalizes Understanding       Medication Safety: - Group verbal and visual instruction to review commonly prescribed medications for heart and lung disease. Reviews the medication, class of the drug, and side effects. Includes the steps to properly store meds and maintain the prescription regimen.  Written material given at graduation. Flowsheet Row Cardiac Rehab from 04/13/2022 in [facility] Cardiac and Pulmonary Rehab  Education need identified 02/02/22  Date 04/13/22  Educator SB  Instruction Review Code 1- Verbalizes Understanding       Intimacy: - Group verbal instruction through game format to discuss how heart and lung disease can affect sexual intimacy. Written material given at graduation.. Flowsheet Row Cardiac Rehab from 04/13/2022 in [facility] Cardiac and Pulmonary Rehab  Date 03/16/22  Educator [facility]  Instruction Review Code 1- Verbalizes Understanding       Know Your Numbers and Heart Failure: - Group verbal and visual instruction to  discuss disease risk factors for cardiac and pulmonary disease and treatment options.  Reviews associated critical values for Overweight/Obesity, Hypertension, Cholesterol, and Diabetes.  Discusses basics of heart failure: signs/symptoms and treatments.  Introduces Heart Failure Zone chart for action plan for heart failure.  Written material given at graduation. Flowsheet Row Cardiac Rehab from 04/13/2022 in [facility] Cardiac and Pulmonary Rehab  Date 02/16/22  Educator SB  Instruction Review Code 1- Verbalizes Understanding       Infection Prevention: - Provides verbal and written material to individual with discussion of infection control including proper hand washing and proper equipment cleaning during exercise session. Flowsheet Row  Cardiac Rehab from 04/13/2022 in [facility] Cardiac and Pulmonary Rehab  Date 01/03/22  Educator [facility]  Instruction Review Code 1- Verbalizes Understanding       Falls Prevention: - Provides verbal and written material to individual with discussion of falls prevention and safety. Flowsheet Row Cardiac Rehab from 04/13/2022 in [facility] Cardiac and Pulmonary Rehab  Date 01/03/22  Educator [facility]  Instruction Review Code 1- Verbalizes Understanding       Other: -Provides group and verbal instruction on various topics (see comments)   Knowledge Questionnaire Score:  Knowledge Questionnaire Score - 04/15/22 0828       Knowledge Questionnaire Score   Pre Score 24/26    Post Score 22/26             Core Components/Risk Factors/Patient Goals at Admission:  Personal Goals and Risk Factors at Admission - 02/02/22 1115       Core Components/Risk Factors/Patient Goals on Admission    Weight Management Yes;Weight Maintenance   Used to be 200lb, lost weight to ~168, gained up to 184 lb the last months   Intervention Weight Management: Develop a combined nutrition and exercise program designed to reach desired caloric intake, while maintaining appropriate intake of nutrient and fiber, sodium and fats, and appropriate energy expenditure required for the weight goal.;Weight Management: Provide education and appropriate resources to help participant work on and attain dietary goals.;Weight Management/Obesity: Establish reasonable short term and long term weight goals.    Admit Weight 184 lb (83.5 kg)    Goal Weight: Short Term 184 lb (83.5 kg)    Goal Weight: Long Term 184 lb (83.5 kg)    Expected Outcomes Short Term: Continue to assess and modify interventions until short term weight is achieved;Long Term: Adherence to nutrition and physical activity/exercise program aimed toward attainment of established weight goal;Weight Maintenance: Understanding of the daily nutrition guidelines, which includes 25-35%  calories from fat, 7% or less cal from saturated fats, less than 261m cholesterol, less than 1.5gm of sodium, & 5 or more servings of fruits and vegetables daily;Understanding recommendations for meals to include 15-35% energy as protein, 25-35% energy from fat, 35-60% energy from carbohydrates, less than 2069mof dietary cholesterol, 20-35 gm of total fiber daily;Understanding of distribution of calorie intake throughout the day with the consumption of 4-5 meals/snacks    Tobacco Cessation Yes    Number of packs per day 0. Quit on 10/10/21    Intervention Assist the participant in steps to quit. Provide individualized education and counseling about committing to Tobacco Cessation, relapse prevention, and pharmacological support that can be provided by physician.;OfAdvice workerassist with locating and accessing local/national Quit Smoking programs, and support quit date choice.    Expected Outcomes Short Term: Will demonstrate readiness to quit, by selecting a quit date.;Short Term: Will quit all tobacco product use, adhering to prevention  of relapse plan.;Long Term: Complete abstinence from all tobacco products for at least 12 months from quit date.    Hypertension Yes    Intervention Provide education on lifestyle modifcations including regular physical activity/exercise, weight management, moderate sodium restriction and increased consumption of fresh fruit, vegetables, and low fat dairy, alcohol moderation, and smoking cessation.;Monitor prescription use compliance.    Expected Outcomes Short Term: Continued assessment and intervention until BP is < 140/66m HG in hypertensive participants. < 130/855mHG in hypertensive participants with diabetes, heart failure or chronic kidney disease.;Long Term: Maintenance of blood pressure at goal levels.    Lipids Yes    Intervention Provide education and support for participant on nutrition & aerobic/resistive exercise along with prescribed  medications to achieve LDL <7046mHDL >56m50m  Expected Outcomes Short Term: Participant states understanding of desired cholesterol values and is compliant with medications prescribed. Participant is following exercise prescription and nutrition guidelines.;Long Term: Cholesterol controlled with medications as prescribed, with individualized exercise RX and with personalized nutrition plan. Value goals: LDL < 70mg52mL > 40 mg.             Education:Diabetes - Individual verbal and written instruction to review signs/symptoms of diabetes, desired ranges of glucose level fasting, after meals and with exercise. Acknowledge that pre and post exercise glucose checks will be done for 3 sessions at entry of program.   Core Components/Risk Factors/Patient Goals Review:   Goals and Risk Factor Review     Row Name 02/23/22 0904 04/04/22 0807           Core Components/Risk Factors/Patient Goals Review   Personal Goals Review Weight Management/Obesity;Hypertension;Lipids;Tobacco Cessation Tobacco Cessation      Review [place] to a good start in rehab. He is trying to gain weight by building muscle back up.  He is up some but was encouraged to make sure he is eating protein.  He has an appointment on Monday with dietitian.  He is still not smoking.  Blood pressures are doing well and he continues to check them at home. [place] smoked since April. He does not want to smoke because he knows it will affect his health. Just the knowing that it is bad for his health was enough for him to stop smoking. He quit without medication or alternate smoking routes.      Expected Outcomes Short: Continue to work on muscle gain Long: Continue to monitor risk factors Short: be tobacco free for one more month. Long: be tobacco free independently.               Core Components/Risk Factors/Patient Goals at Discharge (Final Review):   Goals and Risk Factor Review - 04/04/22 0807       Core  Components/Risk Factors/Patient Goals Review   Personal Goals Review Tobacco Cessation    Review [place] smoked since April. He does not want to smoke because he knows it will affect his health. Just the knowing that it is bad for his health was enough for him to stop smoking. He quit without medication or alternate smoking routes.    Expected Outcomes Short: be tobacco free for one more month. Long: be tobacco free independently.             ITP Comments:  ITP Comments     Row Name 01/03/22 0955 02/02/22 0943 02/14/22 0836 02/16/22 0636 02/28/22 0954   ITP Comments Virtual Visit completed. Patient informed on EP and RD  appointment and 6 Minute walk test. Patient also informed of patient health questionnaires on My Chart. Patient Verbalizes understanding. Visit diagnosis can be found in [facility] 10/12/2021. Completed 6MWT and gym orientation. Initial ITP created and sent for review to Dr. Avelina Laine, Medical Director. First full day of exercise!  Patient was oriented to gym and equipment including functions, settings, policies, and procedures.  Patient's individual exercise prescription and treatment plan were reviewed.  All starting workloads were established based on the results of the 6 minute walk test done at initial orientation visit.  The plan for exercise progression was also introduced and progression will be customized based on patient's performance and goals. 30 Day review completed. Medical Director ITP review done, changes made as directed, and signed approval by Medical Director.    NEW Completed initial RD consultation    [place] Name 03/16/22 0725 04/13/22 0645 04/20/22 0825       ITP Comments 30 Day review completed. Medical Director ITP review done, changes made as directed, and signed approval by Medical Director. 30 Day review completed. Medical Director ITP review done, changes made as directed, and signed approval by Medical Director. Thang graduated today from  rehab with  36 sessions completed.  Details of the patient's exercise prescription and what He needs to do in order to continue the prescription and progress were discussed with patient.  Patient was given a copy of prescription and goals.  Patient verbalized understanding.  Donne plans to continue to exercise by walking and [employer].              Comments: Discharge ITP

## 2022-04-20 NOTE — Progress Notes (Signed)
Daily Session Note  Patient Details  Name: [name] MRN: [id number] Date of Birth: Apr 07, 1956 Referring Provider:   Flowsheet Row Cardiac Rehab from 02/02/2022 in [facility] Cardiac and Pulmonary Rehab  Referring Provider Isaias Cowman MD       Encounter Date: 04/20/2022  Check In:  Session Check In - 04/20/22 0823       Check-In   Supervising physician immediately available to respond to emergencies See telemetry face sheet for immediately available ER MD    Location [facility]-Cardiac & Pulmonary Rehab    Staff Present Antionette Fairy, BS, Exercise Physiologist;Joseph Rosebud Poles, RN, [place]    Virtual Visit No    Medication changes reported     No    Fall or balance concerns reported    No    Warm-up and Cool-down Performed on first and last piece of equipment    Resistance Training Performed Yes    VAD Patient? No    PAD/SET Patient? No      Pain Assessment   Currently in Pain? No/denies                Social History   Tobacco Use  Smoking Status Former   Packs/day: 1.00   Years: 46.00   Total pack years: 46.00   Types: Cigarettes   Quit date: 10/09/2021   Years since quitting: 0.5  Smokeless Tobacco Never    Goals Met:  Independence with exercise equipment Exercise tolerated well No report of concerns or symptoms today Strength training completed today  Goals Unmet:  Not Applicable  Comments:  Yuvraj graduated today from  rehab with 36 sessions completed.  Details of the patient's exercise prescription and what He needs to do in order to continue the prescription and progress were discussed with patient.  Patient was given a copy of prescription and goals.  Patient verbalized understanding.  Merrell plans to continue to exercise by walking and [employer].    Dr. Emily Filbert is Medical Director for [place].  Dr. Ottie Glazier is Medical Director for [facility] Pulmonary Rehabilitation.

## 2022-04-20 NOTE — Progress Notes (Signed)
Discharge Summary: [name] (DOB 2055/10/04)  [name] graduated today from  rehab with 36 sessions completed.  Details of the patient's exercise prescription and what He needs to do in order to continue the prescription and progress were discussed with patient.  Patient was given a copy of prescription and goals.  Patient verbalized understanding.  [name] plans to continue to exercise by walking and [employer].   [place] Name 02/02/22 1111 04/13/22 0827       6 Minute Walk   Phase Initial Discharge    Distance 1410 feet 1810 feet    Distance % Change -- 28.3 %    Distance Feet Change -- 400 ft    Walk Time 6 minutes 6 minutes    # of Rest Breaks 0 0    MPH 2.67 3.43    METS 3.57 4.64    RPE 9 14    Perceived Dyspnea  0 0    VO2 Peak 12.51 16.23    Symptoms No No    Resting HR 62 bpm 79 bpm    Resting BP 126/70 138/70    Resting Oxygen Saturation  98 % 96 %    Exercise Oxygen Saturation  during 6 min walk 99 % 98 %    Max Ex. HR 92 bpm 120 bpm    Max Ex. BP 142/68 168/74    2 Minute Post BP 132/70 146/68

## 2022-05-20 DIAGNOSIS — I350 Nonrheumatic aortic (valve) stenosis: Secondary | ICD-10-CM | POA: Diagnosis not present

## 2022-05-20 DIAGNOSIS — K219 Gastro-esophageal reflux disease without esophagitis: Secondary | ICD-10-CM | POA: Diagnosis not present

## 2022-05-20 DIAGNOSIS — I10 Essential (primary) hypertension: Secondary | ICD-10-CM | POA: Diagnosis not present

## 2022-05-20 DIAGNOSIS — Z0000 Encounter for general adult medical examination without abnormal findings: Secondary | ICD-10-CM | POA: Diagnosis not present

## 2022-05-20 DIAGNOSIS — Z720 Tobacco use: Secondary | ICD-10-CM | POA: Diagnosis not present

## 2022-05-20 DIAGNOSIS — R7303 Prediabetes: Secondary | ICD-10-CM | POA: Diagnosis not present

## 2022-05-20 DIAGNOSIS — Z122 Encounter for screening for malignant neoplasm of respiratory organs: Secondary | ICD-10-CM | POA: Diagnosis not present

## 2022-05-20 DIAGNOSIS — Z23 Encounter for immunization: Secondary | ICD-10-CM | POA: Diagnosis not present

## 2022-05-20 DIAGNOSIS — E785 Hyperlipidemia, unspecified: Secondary | ICD-10-CM | POA: Diagnosis not present

## 2022-05-24 NOTE — Telephone Encounter (Signed)
Letter mailed to patient to call for scheduling annual LDCT

## 2022-05-31 DIAGNOSIS — H6123 Impacted cerumen, bilateral: Secondary | ICD-10-CM | POA: Diagnosis not present

## 2022-05-31 DIAGNOSIS — H6063 Unspecified chronic otitis externa, bilateral: Secondary | ICD-10-CM | POA: Diagnosis not present

## 2022-06-09 DIAGNOSIS — B49 Unspecified mycosis: Secondary | ICD-10-CM | POA: Diagnosis not present

## 2022-06-09 DIAGNOSIS — H3581 Retinal edema: Secondary | ICD-10-CM | POA: Diagnosis not present

## 2022-06-09 DIAGNOSIS — H4419 Other endophthalmitis: Secondary | ICD-10-CM | POA: Diagnosis not present

## 2022-06-09 DIAGNOSIS — H35351 Cystoid macular degeneration, right eye: Secondary | ICD-10-CM | POA: Diagnosis not present

## 2022-06-20 DIAGNOSIS — H547 Unspecified visual loss: Secondary | ICD-10-CM | POA: Diagnosis not present

## 2022-06-20 DIAGNOSIS — I358 Other nonrheumatic aortic valve disorders: Secondary | ICD-10-CM | POA: Diagnosis not present

## 2022-06-20 DIAGNOSIS — R011 Cardiac murmur, unspecified: Secondary | ICD-10-CM | POA: Diagnosis not present

## 2022-06-20 DIAGNOSIS — I10 Essential (primary) hypertension: Secondary | ICD-10-CM | POA: Diagnosis not present

## 2022-06-20 DIAGNOSIS — E663 Overweight: Secondary | ICD-10-CM | POA: Diagnosis not present

## 2022-06-20 DIAGNOSIS — Z7982 Long term (current) use of aspirin: Secondary | ICD-10-CM | POA: Diagnosis not present

## 2022-06-20 DIAGNOSIS — Z87891 Personal history of nicotine dependence: Secondary | ICD-10-CM | POA: Diagnosis not present

## 2022-07-05 ENCOUNTER — Encounter: Payer: Self-pay | Admitting: *Deleted

## 2022-07-05 ENCOUNTER — Telehealth: Payer: Self-pay | Admitting: *Deleted

## 2022-07-05 NOTE — Patient Outreach (Signed)
  Care Coordination   Initial Visit Note   07/05/2022 Name: [name] MRN: [id number] DOB: 02-Jul-1956  [name] is a 66 y.o. year old male who sees Leonel Ramsay, MD for primary care. I spoke with  [name] by phone today.  What matters to the patients health and wellness today?  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  Finished cardiac rehab in October, now active at the [facility] with [name]. Does not feel he is in need of follow up at this time.     Goals Addressed             This Visit's Progress    COMPLETED: Care Coordination activities - no follow up needed       Care Coordination Interventions: Evaluation of current treatment plan related to TAVR in April and patient's adherence to plan as established by provider Advised patient to [facility] provider if having chest pain or discomfort or shortness of breath Reviewed medications with patient and discussed adherence and affordability Reviewed scheduled/upcoming provider appointments including Eye on 1/4, Cardiology on 1/23, cardio surgeon on 5/7, and PCP on 5/15 Discussed plans with patient for ongoing care management follow up and provided patient with direct contact information for care management team Screening for signs and symptoms of depression related to chronic disease state  Assessed social determinant of health barriers         SDOH assessments and interventions completed:  Yes  SDOH Interventions Today    Flowsheet Row Most Recent Value  SDOH Interventions   Food Insecurity Interventions Intervention Not Indicated  Housing Interventions Intervention Not Indicated  Transportation Interventions Intervention Not Indicated        Care Coordination Interventions:  Yes, provided   Follow up plan: No further intervention required.   Encounter Outcome:  Pt. Visit Completed   Terry David, RN, MSN [place] Management Care Management Coordinator [phone]

## 2022-07-05 NOTE — Patient Instructions (Signed)
Visit Information  Thank you for taking time to visit with me today. Please don't hesitate to contact me if I can be of assistance to you.  Following are the goals we discussed today:  Monitor heart rate and blood pressure daily. Continue being active at the gym  Please call the Suicide and Crisis Lifeline: 988 call the [place] National Suicide Prevention Lifeline: 713-300-4141 or TTY: (406)693-5813 TTY (873) 026-8488) to talk to a trained counselor call 1-800-273-TALK (toll free, 24 hour hotline) call 911 if you are experiencing a Mental Health or [place] or need someone to talk to.  Patient verbalizes understanding of instructions and care plan provided today and agrees to view in [place]. Active MyChart status and patient understanding of how to access instructions and care plan via MyChart confirmed with patient.     The patient has been provided with contact information for the care management team and has been advised to call with any health related questions or concerns.   [name], RN, MSN, [place] Care Management Care Management Coordinator [phone]

## 2022-07-14 DIAGNOSIS — H4389 Other disorders of vitreous body: Secondary | ICD-10-CM | POA: Diagnosis not present

## 2022-07-14 DIAGNOSIS — H44431 Hypotony of eye due to other ocular disorders, right eye: Secondary | ICD-10-CM | POA: Diagnosis not present

## 2022-07-14 DIAGNOSIS — H35351 Cystoid macular degeneration, right eye: Secondary | ICD-10-CM | POA: Diagnosis not present

## 2022-07-14 DIAGNOSIS — H4419 Other endophthalmitis: Secondary | ICD-10-CM | POA: Diagnosis not present

## 2022-07-14 DIAGNOSIS — H3581 Retinal edema: Secondary | ICD-10-CM | POA: Diagnosis not present

## 2022-07-14 DIAGNOSIS — B49 Unspecified mycosis: Secondary | ICD-10-CM | POA: Diagnosis not present

## 2022-08-02 DIAGNOSIS — I6381 Other cerebral infarction due to occlusion or stenosis of small artery: Secondary | ICD-10-CM | POA: Diagnosis not present

## 2022-08-02 DIAGNOSIS — I350 Nonrheumatic aortic (valve) stenosis: Secondary | ICD-10-CM | POA: Diagnosis not present

## 2022-08-02 DIAGNOSIS — K559 Vascular disorder of intestine, unspecified: Secondary | ICD-10-CM | POA: Diagnosis not present

## 2022-08-02 DIAGNOSIS — I9789 Other postprocedural complications and disorders of the circulatory system, not elsewhere classified: Secondary | ICD-10-CM | POA: Diagnosis not present

## 2022-08-02 DIAGNOSIS — I4891 Unspecified atrial fibrillation: Secondary | ICD-10-CM | POA: Diagnosis not present

## 2022-08-02 DIAGNOSIS — Z720 Tobacco use: Secondary | ICD-10-CM | POA: Diagnosis not present

## 2022-08-02 DIAGNOSIS — J449 Chronic obstructive pulmonary disease, unspecified: Secondary | ICD-10-CM | POA: Diagnosis not present

## 2022-08-02 DIAGNOSIS — Z952 Presence of prosthetic heart valve: Secondary | ICD-10-CM | POA: Diagnosis not present

## 2022-08-02 DIAGNOSIS — Z95828 Presence of other vascular implants and grafts: Secondary | ICD-10-CM | POA: Diagnosis not present

## 2022-08-02 DIAGNOSIS — E785 Hyperlipidemia, unspecified: Secondary | ICD-10-CM | POA: Diagnosis not present

## 2022-08-02 DIAGNOSIS — I10 Essential (primary) hypertension: Secondary | ICD-10-CM | POA: Diagnosis not present

## 2022-08-02 DIAGNOSIS — J95821 Acute postprocedural respiratory failure: Secondary | ICD-10-CM | POA: Diagnosis not present

## 2022-08-10 DIAGNOSIS — I350 Nonrheumatic aortic (valve) stenosis: Secondary | ICD-10-CM | POA: Diagnosis not present

## 2022-08-10 DIAGNOSIS — R42 Dizziness and giddiness: Secondary | ICD-10-CM | POA: Diagnosis not present

## 2022-08-10 DIAGNOSIS — I10 Essential (primary) hypertension: Secondary | ICD-10-CM | POA: Diagnosis not present

## 2022-08-10 DIAGNOSIS — M25561 Pain in right knee: Secondary | ICD-10-CM | POA: Diagnosis not present

## 2022-08-25 DIAGNOSIS — H3581 Retinal edema: Secondary | ICD-10-CM | POA: Diagnosis not present

## 2022-08-25 DIAGNOSIS — H4419 Other endophthalmitis: Secondary | ICD-10-CM | POA: Diagnosis not present

## 2022-08-25 DIAGNOSIS — B49 Unspecified mycosis: Secondary | ICD-10-CM | POA: Diagnosis not present

## 2022-08-25 DIAGNOSIS — H35351 Cystoid macular degeneration, right eye: Secondary | ICD-10-CM | POA: Diagnosis not present

## 2022-08-25 DIAGNOSIS — H4389 Other disorders of vitreous body: Secondary | ICD-10-CM | POA: Diagnosis not present

## 2022-08-31 DIAGNOSIS — H4419 Other endophthalmitis: Secondary | ICD-10-CM | POA: Diagnosis not present

## 2022-08-31 DIAGNOSIS — B49 Unspecified mycosis: Secondary | ICD-10-CM | POA: Diagnosis not present

## 2022-08-31 DIAGNOSIS — H21541 Posterior synechiae (iris), right eye: Secondary | ICD-10-CM | POA: Diagnosis not present

## 2022-08-31 DIAGNOSIS — H4389 Other disorders of vitreous body: Secondary | ICD-10-CM | POA: Diagnosis not present

## 2022-08-31 DIAGNOSIS — H25811 Combined forms of age-related cataract, right eye: Secondary | ICD-10-CM | POA: Diagnosis not present

## 2022-09-20 DIAGNOSIS — R7303 Prediabetes: Secondary | ICD-10-CM | POA: Diagnosis not present

## 2022-09-20 DIAGNOSIS — Z79899 Other long term (current) drug therapy: Secondary | ICD-10-CM | POA: Diagnosis not present

## 2022-09-20 DIAGNOSIS — H5703 Miosis: Secondary | ICD-10-CM | POA: Diagnosis not present

## 2022-09-20 DIAGNOSIS — I4891 Unspecified atrial fibrillation: Secondary | ICD-10-CM | POA: Diagnosis not present

## 2022-09-20 DIAGNOSIS — Z952 Presence of prosthetic heart valve: Secondary | ICD-10-CM | POA: Diagnosis not present

## 2022-09-20 DIAGNOSIS — J449 Chronic obstructive pulmonary disease, unspecified: Secondary | ICD-10-CM | POA: Diagnosis not present

## 2022-09-20 DIAGNOSIS — H2589 Other age-related cataract: Secondary | ICD-10-CM | POA: Diagnosis not present

## 2022-09-20 DIAGNOSIS — H21541 Posterior synechiae (iris), right eye: Secondary | ICD-10-CM | POA: Diagnosis not present

## 2022-09-20 DIAGNOSIS — Z83518 Family history of other specified eye disorder: Secondary | ICD-10-CM | POA: Diagnosis not present

## 2022-09-20 DIAGNOSIS — Z7982 Long term (current) use of aspirin: Secondary | ICD-10-CM | POA: Diagnosis not present

## 2022-09-20 DIAGNOSIS — H25811 Combined forms of age-related cataract, right eye: Secondary | ICD-10-CM | POA: Diagnosis not present

## 2022-09-20 DIAGNOSIS — I10 Essential (primary) hypertension: Secondary | ICD-10-CM | POA: Diagnosis not present

## 2022-09-20 DIAGNOSIS — Z87891 Personal history of nicotine dependence: Secondary | ICD-10-CM | POA: Diagnosis not present

## 2022-09-20 DIAGNOSIS — K219 Gastro-esophageal reflux disease without esophagitis: Secondary | ICD-10-CM | POA: Diagnosis not present

## 2022-09-20 DIAGNOSIS — I9789 Other postprocedural complications and disorders of the circulatory system, not elsewhere classified: Secondary | ICD-10-CM | POA: Diagnosis not present

## 2022-10-13 DIAGNOSIS — K1379 Other lesions of oral mucosa: Secondary | ICD-10-CM | POA: Diagnosis not present

## 2022-10-13 DIAGNOSIS — R220 Localized swelling, mass and lump, head: Secondary | ICD-10-CM | POA: Diagnosis not present

## 2022-10-13 DIAGNOSIS — T7840XA Allergy, unspecified, initial encounter: Secondary | ICD-10-CM | POA: Diagnosis not present

## 2022-10-20 DIAGNOSIS — Z961 Presence of intraocular lens: Secondary | ICD-10-CM | POA: Diagnosis not present

## 2022-10-20 DIAGNOSIS — H4389 Other disorders of vitreous body: Secondary | ICD-10-CM | POA: Diagnosis not present

## 2022-10-20 DIAGNOSIS — H44431 Hypotony of eye due to other ocular disorders, right eye: Secondary | ICD-10-CM | POA: Diagnosis not present

## 2022-10-20 DIAGNOSIS — H4419 Other endophthalmitis: Secondary | ICD-10-CM | POA: Diagnosis not present

## 2022-10-20 DIAGNOSIS — H35351 Cystoid macular degeneration, right eye: Secondary | ICD-10-CM | POA: Diagnosis not present

## 2022-10-20 DIAGNOSIS — B49 Unspecified mycosis: Secondary | ICD-10-CM | POA: Diagnosis not present

## 2022-10-20 DIAGNOSIS — H3581 Retinal edema: Secondary | ICD-10-CM | POA: Diagnosis not present

## 2022-11-07 DIAGNOSIS — R002 Palpitations: Secondary | ICD-10-CM | POA: Diagnosis not present

## 2022-11-11 DIAGNOSIS — Z7982 Long term (current) use of aspirin: Secondary | ICD-10-CM | POA: Diagnosis not present

## 2022-11-11 DIAGNOSIS — Z87891 Personal history of nicotine dependence: Secondary | ICD-10-CM | POA: Diagnosis not present

## 2022-11-11 DIAGNOSIS — I10 Essential (primary) hypertension: Secondary | ICD-10-CM | POA: Diagnosis not present

## 2022-11-11 DIAGNOSIS — D6869 Other thrombophilia: Secondary | ICD-10-CM | POA: Diagnosis not present

## 2022-11-11 DIAGNOSIS — R011 Cardiac murmur, unspecified: Secondary | ICD-10-CM | POA: Diagnosis not present

## 2022-11-11 DIAGNOSIS — E663 Overweight: Secondary | ICD-10-CM | POA: Diagnosis not present

## 2022-11-11 DIAGNOSIS — I4891 Unspecified atrial fibrillation: Secondary | ICD-10-CM | POA: Diagnosis not present

## 2022-11-15 DIAGNOSIS — F17200 Nicotine dependence, unspecified, uncomplicated: Secondary | ICD-10-CM | POA: Diagnosis not present

## 2022-11-15 DIAGNOSIS — Z952 Presence of prosthetic heart valve: Secondary | ICD-10-CM | POA: Diagnosis not present

## 2022-11-15 DIAGNOSIS — J9809 Other diseases of bronchus, not elsewhere classified: Secondary | ICD-10-CM | POA: Diagnosis not present

## 2022-11-15 DIAGNOSIS — J432 Centrilobular emphysema: Secondary | ICD-10-CM | POA: Diagnosis not present

## 2022-11-15 DIAGNOSIS — Z95828 Presence of other vascular implants and grafts: Secondary | ICD-10-CM | POA: Diagnosis not present

## 2022-11-15 DIAGNOSIS — Z48812 Encounter for surgical aftercare following surgery on the circulatory system: Secondary | ICD-10-CM | POA: Diagnosis not present

## 2022-11-15 DIAGNOSIS — Z8679 Personal history of other diseases of the circulatory system: Secondary | ICD-10-CM | POA: Diagnosis not present

## 2022-11-23 DIAGNOSIS — Z122 Encounter for screening for malignant neoplasm of respiratory organs: Secondary | ICD-10-CM | POA: Diagnosis not present

## 2022-11-23 DIAGNOSIS — Z0000 Encounter for general adult medical examination without abnormal findings: Secondary | ICD-10-CM | POA: Diagnosis not present

## 2022-11-23 DIAGNOSIS — R7303 Prediabetes: Secondary | ICD-10-CM | POA: Diagnosis not present

## 2022-11-23 DIAGNOSIS — E785 Hyperlipidemia, unspecified: Secondary | ICD-10-CM | POA: Diagnosis not present

## 2022-11-23 DIAGNOSIS — Z720 Tobacco use: Secondary | ICD-10-CM | POA: Diagnosis not present

## 2022-11-23 DIAGNOSIS — I350 Nonrheumatic aortic (valve) stenosis: Secondary | ICD-10-CM | POA: Diagnosis not present

## 2022-11-23 DIAGNOSIS — I10 Essential (primary) hypertension: Secondary | ICD-10-CM | POA: Diagnosis not present

## 2022-11-23 DIAGNOSIS — K219 Gastro-esophageal reflux disease without esophagitis: Secondary | ICD-10-CM | POA: Diagnosis not present

## 2022-11-23 DIAGNOSIS — R42 Dizziness and giddiness: Secondary | ICD-10-CM | POA: Diagnosis not present

## 2023-01-04 DIAGNOSIS — R42 Dizziness and giddiness: Secondary | ICD-10-CM | POA: Diagnosis not present

## 2023-01-04 DIAGNOSIS — I350 Nonrheumatic aortic (valve) stenosis: Secondary | ICD-10-CM | POA: Diagnosis not present

## 2023-01-04 DIAGNOSIS — R7303 Prediabetes: Secondary | ICD-10-CM | POA: Diagnosis not present

## 2023-01-04 DIAGNOSIS — I10 Essential (primary) hypertension: Secondary | ICD-10-CM | POA: Diagnosis not present

## 2023-01-04 DIAGNOSIS — E785 Hyperlipidemia, unspecified: Secondary | ICD-10-CM | POA: Diagnosis not present

## 2023-01-04 DIAGNOSIS — K219 Gastro-esophageal reflux disease without esophagitis: Secondary | ICD-10-CM | POA: Diagnosis not present

## 2023-01-06 DIAGNOSIS — R2689 Other abnormalities of gait and mobility: Secondary | ICD-10-CM | POA: Diagnosis not present

## 2023-01-06 DIAGNOSIS — R42 Dizziness and giddiness: Secondary | ICD-10-CM | POA: Diagnosis not present

## 2023-01-09 ENCOUNTER — Other Ambulatory Visit: Payer: Self-pay | Admitting: Student

## 2023-01-09 DIAGNOSIS — R2689 Other abnormalities of gait and mobility: Secondary | ICD-10-CM

## 2023-01-09 DIAGNOSIS — R42 Dizziness and giddiness: Secondary | ICD-10-CM | POA: Diagnosis not present

## 2023-01-09 DIAGNOSIS — H6123 Impacted cerumen, bilateral: Secondary | ICD-10-CM | POA: Diagnosis not present

## 2023-01-17 DIAGNOSIS — H903 Sensorineural hearing loss, bilateral: Secondary | ICD-10-CM | POA: Diagnosis not present

## 2023-01-17 DIAGNOSIS — R42 Dizziness and giddiness: Secondary | ICD-10-CM | POA: Diagnosis not present

## 2023-01-18 ENCOUNTER — Ambulatory Visit
Admission: RE | Admit: 2023-01-18 | Discharge: 2023-01-18 | Disposition: A | Discharge status: Home / Self Care | Payer: PPO | Source: Ambulatory Visit | Attending: Student | Admitting: Student

## 2023-01-18 DIAGNOSIS — R2689 Other abnormalities of gait and mobility: Secondary | ICD-10-CM | POA: Diagnosis not present

## 2023-01-18 DIAGNOSIS — R42 Dizziness and giddiness: Secondary | ICD-10-CM | POA: Diagnosis not present

## 2023-02-01 DIAGNOSIS — I9789 Other postprocedural complications and disorders of the circulatory system, not elsewhere classified: Secondary | ICD-10-CM | POA: Diagnosis not present

## 2023-02-01 DIAGNOSIS — Z95828 Presence of other vascular implants and grafts: Secondary | ICD-10-CM | POA: Diagnosis not present

## 2023-02-01 DIAGNOSIS — R002 Palpitations: Secondary | ICD-10-CM | POA: Diagnosis not present

## 2023-02-01 DIAGNOSIS — E785 Hyperlipidemia, unspecified: Secondary | ICD-10-CM | POA: Diagnosis not present

## 2023-02-01 DIAGNOSIS — I10 Essential (primary) hypertension: Secondary | ICD-10-CM | POA: Diagnosis not present

## 2023-02-01 DIAGNOSIS — I350 Nonrheumatic aortic (valve) stenosis: Secondary | ICD-10-CM | POA: Diagnosis not present

## 2023-02-01 DIAGNOSIS — I4891 Unspecified atrial fibrillation: Secondary | ICD-10-CM | POA: Diagnosis not present

## 2023-02-01 DIAGNOSIS — I358 Other nonrheumatic aortic valve disorders: Secondary | ICD-10-CM | POA: Diagnosis not present

## 2023-02-01 DIAGNOSIS — I351 Nonrheumatic aortic (valve) insufficiency: Secondary | ICD-10-CM | POA: Diagnosis not present

## 2023-02-01 DIAGNOSIS — Z952 Presence of prosthetic heart valve: Secondary | ICD-10-CM | POA: Diagnosis not present

## 2023-02-02 DIAGNOSIS — H35351 Cystoid macular degeneration, right eye: Secondary | ICD-10-CM | POA: Diagnosis not present

## 2023-02-02 DIAGNOSIS — H3581 Retinal edema: Secondary | ICD-10-CM | POA: Diagnosis not present

## 2023-02-02 DIAGNOSIS — B49 Unspecified mycosis: Secondary | ICD-10-CM | POA: Diagnosis not present

## 2023-02-02 DIAGNOSIS — H4419 Other endophthalmitis: Secondary | ICD-10-CM | POA: Diagnosis not present

## 2023-02-14 DIAGNOSIS — R202 Paresthesia of skin: Secondary | ICD-10-CM | POA: Diagnosis not present

## 2023-02-14 DIAGNOSIS — R200 Anesthesia of skin: Secondary | ICD-10-CM | POA: Diagnosis not present

## 2023-02-15 DIAGNOSIS — M79674 Pain in right toe(s): Secondary | ICD-10-CM | POA: Diagnosis not present

## 2023-02-15 DIAGNOSIS — B351 Tinea unguium: Secondary | ICD-10-CM | POA: Diagnosis not present

## 2023-02-15 DIAGNOSIS — L600 Ingrowing nail: Secondary | ICD-10-CM | POA: Diagnosis not present

## 2023-02-15 DIAGNOSIS — R7303 Prediabetes: Secondary | ICD-10-CM | POA: Diagnosis not present

## 2023-02-15 DIAGNOSIS — M79675 Pain in left toe(s): Secondary | ICD-10-CM | POA: Diagnosis not present

## 2023-03-01 DIAGNOSIS — G629 Polyneuropathy, unspecified: Secondary | ICD-10-CM | POA: Diagnosis not present

## 2023-03-01 DIAGNOSIS — R42 Dizziness and giddiness: Secondary | ICD-10-CM | POA: Diagnosis not present

## 2023-03-01 DIAGNOSIS — Z8673 Personal history of transient ischemic attack (TIA), and cerebral infarction without residual deficits: Secondary | ICD-10-CM | POA: Diagnosis not present

## 2023-03-06 DIAGNOSIS — E785 Hyperlipidemia, unspecified: Secondary | ICD-10-CM | POA: Diagnosis not present

## 2023-03-06 DIAGNOSIS — Z125 Encounter for screening for malignant neoplasm of prostate: Secondary | ICD-10-CM | POA: Diagnosis not present

## 2023-03-06 DIAGNOSIS — R7303 Prediabetes: Secondary | ICD-10-CM | POA: Diagnosis not present

## 2023-03-06 DIAGNOSIS — I350 Nonrheumatic aortic (valve) stenosis: Secondary | ICD-10-CM | POA: Diagnosis not present

## 2023-03-06 DIAGNOSIS — I10 Essential (primary) hypertension: Secondary | ICD-10-CM | POA: Diagnosis not present

## 2023-03-06 DIAGNOSIS — I6381 Other cerebral infarction due to occlusion or stenosis of small artery: Secondary | ICD-10-CM | POA: Diagnosis not present

## 2023-03-06 DIAGNOSIS — R42 Dizziness and giddiness: Secondary | ICD-10-CM | POA: Diagnosis not present

## 2023-03-06 DIAGNOSIS — Z952 Presence of prosthetic heart valve: Secondary | ICD-10-CM | POA: Diagnosis not present

## 2023-03-06 DIAGNOSIS — N1831 Chronic kidney disease, stage 3a: Secondary | ICD-10-CM | POA: Diagnosis not present

## 2023-05-11 DIAGNOSIS — H4419 Other endophthalmitis: Secondary | ICD-10-CM | POA: Diagnosis not present

## 2023-05-11 DIAGNOSIS — H44431 Hypotony of eye due to other ocular disorders, right eye: Secondary | ICD-10-CM | POA: Diagnosis not present

## 2023-05-11 DIAGNOSIS — B49 Unspecified mycosis: Secondary | ICD-10-CM | POA: Diagnosis not present

## 2023-05-11 DIAGNOSIS — H4389 Other disorders of vitreous body: Secondary | ICD-10-CM | POA: Diagnosis not present

## 2023-05-11 DIAGNOSIS — H35351 Cystoid macular degeneration, right eye: Secondary | ICD-10-CM | POA: Diagnosis not present

## 2023-05-11 DIAGNOSIS — H3581 Retinal edema: Secondary | ICD-10-CM | POA: Diagnosis not present

## 2023-05-13 ENCOUNTER — Emergency Department
Admission: EM | Admit: 2023-05-13 | Discharge: 2023-05-13 | Disposition: A | Discharge status: Home / Self Care | Payer: PPO | Attending: Emergency Medicine | Admitting: Emergency Medicine

## 2023-05-13 ENCOUNTER — Emergency Department: Payer: PPO

## 2023-05-13 ENCOUNTER — Other Ambulatory Visit: Payer: Self-pay

## 2023-05-13 DIAGNOSIS — F918 Other conduct disorders: Secondary | ICD-10-CM | POA: Diagnosis not present

## 2023-05-13 DIAGNOSIS — R531 Weakness: Secondary | ICD-10-CM | POA: Diagnosis not present

## 2023-05-13 DIAGNOSIS — I2510 Atherosclerotic heart disease of native coronary artery without angina pectoris: Secondary | ICD-10-CM | POA: Diagnosis not present

## 2023-05-13 DIAGNOSIS — R569 Unspecified convulsions: Secondary | ICD-10-CM

## 2023-05-13 DIAGNOSIS — I10 Essential (primary) hypertension: Secondary | ICD-10-CM | POA: Diagnosis not present

## 2023-05-13 LAB — CBG MONITORING, ED: Glucose-Capillary: 85 mg/dL (ref 70–99)

## 2023-05-13 LAB — CBC WITH DIFFERENTIAL/PLATELET
Abs Immature Granulocytes: 0.02 10*3/uL (ref 0.00–0.07)
Basophils Absolute: 0.1 10*3/uL (ref 0.0–0.1)
Basophils Relative: 1 %
Eosinophils Absolute: 0.2 10*3/uL (ref 0.0–0.5)
Eosinophils Relative: 3 %
HCT: 40.1 % (ref 39.0–52.0)
Hemoglobin: 13.6 g/dL (ref 13.0–17.0)
Immature Granulocytes: 0 %
Lymphocytes Relative: 31 %
Lymphs Abs: 2 10*3/uL (ref 0.7–4.0)
MCH: 30.6 pg (ref 26.0–34.0)
MCHC: 33.9 g/dL (ref 30.0–36.0)
MCV: 90.3 fL (ref 80.0–100.0)
Monocytes Absolute: 0.7 10*3/uL (ref 0.1–1.0)
Monocytes Relative: 11 %
Neutro Abs: 3.5 10*3/uL (ref 1.7–7.7)
Neutrophils Relative %: 54 %
Platelets: 234 10*3/uL (ref 150–400)
RBC: 4.44 MIL/uL (ref 4.22–5.81)
RDW: 13.3 % (ref 11.5–15.5)
WBC: 6.5 10*3/uL (ref 4.0–10.5)
nRBC: 0 % (ref 0.0–0.2)

## 2023-05-13 LAB — COMPREHENSIVE METABOLIC PANEL
ALT: 28 U/L (ref 0–44)
AST: 36 U/L (ref 15–41)
Albumin: 4.4 g/dL (ref 3.5–5.0)
Alkaline Phosphatase: 52 U/L (ref 38–126)
Anion gap: 9 (ref 5–15)
BUN: 19 mg/dL (ref 8–23)
CO2: 24 mmol/L (ref 22–32)
Calcium: 9.2 mg/dL (ref 8.9–10.3)
Chloride: 103 mmol/L (ref 98–111)
Creatinine, Ser: 1.4 mg/dL — ABNORMAL HIGH (ref 0.61–1.24)
GFR, Estimated: 55 mL/min — ABNORMAL LOW (ref >60)
Glucose, Bld: 100 mg/dL — ABNORMAL HIGH (ref 70–99)
Potassium: 4 mmol/L (ref 3.5–5.1)
Sodium: 136 mmol/L (ref 135–145)
Total Bilirubin: 0.7 mg/dL (ref 0.3–1.2)
Total Protein: 8.1 g/dL (ref 6.5–8.1)

## 2023-05-13 LAB — APTT: aPTT: 23 s — ABNORMAL LOW (ref 24–36)

## 2023-05-13 NOTE — ED Triage Notes (Signed)
Patient comes in via [facility] from home after having a witnessed seizure. According to EMS, the patient was at home when he sat down in a chair after cooking dinner and began having a full body seizure according to family. Patient has multiple bite injuries to his tongue. Pt is not complaining of any pain at this time, and he doesn't remember anything that happened. Pt has no history of seizures. He has a an extensive cardiac, and digestive history. Pt is alert and oriented at this time, and with no signs of acute distress at this time.

## 2023-05-13 NOTE — ED Provider Notes (Signed)
[facility] Provider Note    Event Date/Time   First MD Initiated Contact with Patient 05/13/23 1853     (approximate)   History   Chief Complaint: Seizures   HPI  [name] is a 67 y.o. male with a past history of hypertension and CAD who was brought to the ED after a suspected seizure.  He has no history of seizure.  No recent trauma or illness.  Does not drink alcohol.  Has been compliant with his medication regimen and in his usual state of health.  This evening after cooking dinner he went to sit in the chair, reports having a "funny feeling" that felt like lightheadedness, and then subsequently had loss of consciousness with full body vigorous uncontrolled movements lasting 3 to 5 minutes according to family who witnessed it.  Patient also had tongue biting.  After the movements stopped, he was confused.  EMS report patient being confused on their arrival as well.  Patient denies any headache chest pain shortness of breath or other complaints currently.  Denies any preceding thunderclap headache or other acute symptoms.     Physical Exam   Triage Vital Signs: ED Triage Vitals  Encounter Vitals Group     BP 05/13/23 1905 131/77     Systolic BP Percentile --      Diastolic BP Percentile --      Pulse Rate 05/13/23 1905 80     Resp 05/13/23 1905 17     Temp 05/13/23 1905 98.2 F (36.8 C)     Temp Source 05/13/23 1905 Oral     SpO2 05/13/23 1905 94 %     Weight 05/13/23 1850 223 lb 11.2 oz (101.5 kg)     Height 05/13/23 1850 5\' 11"  (1.803 m)     Head Circumference --      Peak Flow --      Pain Score 05/13/23 1849 0     Pain Loc --      Pain Education --      Exclude from Growth Chart --     Most recent vital signs: Vitals:   05/13/23 2030 05/13/23 2100  BP: 123/79 133/77  Pulse: 70 72  Resp: 12 (!) 21  Temp:    SpO2: 96% 96%    General: Awake, no distress.  CV:  Good peripheral perfusion.  Regular rate and rhythm Resp:  Normal  effort.  Clear to auscultation bilaterally Abd:  No distention.  Soft nontender Other:  Abrasion and contusion of the anterior tip of the tongue, right lateral aspect of the tongue, and on the right inferior surface of the tongue.  No active bleeding. Cranial nerves III through XII intact.  Oriented, normal language.  Moving all extremities.  Full range of motion.  No bony tenderness.  NIH stroke scale 0   ED Results / Procedures / Treatments   Labs (all labs ordered are listed, but only abnormal results are displayed) Labs Reviewed  COMPREHENSIVE METABOLIC PANEL - Abnormal; Notable for the following components:      Result Value   Glucose, Bld 100 (*)    Creatinine, Ser 1.40 (*)    GFR, Estimated 55 (*)    All other components within normal limits  APTT - Abnormal; Notable for the following components:   aPTT 23 (*)    All other components within normal limits  CBC WITH DIFFERENTIAL/PLATELET  CBG MONITORING, ED     EKG Interpreted by me Sinus rhythm rate of  80.  Normal axis, right bundle branch block.  No acute ischemic changes.   RADIOLOGY CT head interpreted by me, appears normal.  Radiology report reviewed   PROCEDURES:  Procedures   MEDICATIONS ORDERED IN ED: Medications - No data to display   IMPRESSION / MDM / ASSESSMENT AND PLAN / ED COURSE  I reviewed the triage vital signs and the nursing notes.  DDx: Intracranial mass, intracranial hemorrhage, electrolyte abnormality, anemia, dehydration, arrhythmia  Patient's presentation is most consistent with acute presentation with potential threat to life or bodily function.  Patient presents with loss of consciousness, suspicious for seizure.  No history of seizure, no apparent inciting circumstances.  Will obtain labs and CT head.   ----------------------------------------- 9:43 PM on 05/13/2023 ----------------------------------------- Workup normal.  Patient remains asymptomatic.  Seizure precautions  discussed with patient and family members at bedside.  Suitable for outpatient follow-up with neurology.      FINAL CLINICAL IMPRESSION(S) / ED DIAGNOSES   Final diagnoses:  Seizure (HCC)     Rx / DC Orders   ED Discharge Orders     None        Note:  This document was prepared using Dragon voice recognition software and may include unintentional dictation errors.   Sharman Cheek, MD 05/13/23 2144

## 2023-05-13 NOTE — ED Notes (Signed)
Patient laying down resting.  Alert and oriented. No complaints of dizziness, pain at this time.  Family at the bedside.

## 2023-05-14 ENCOUNTER — Encounter (HOSPITAL_COMMUNITY): Payer: Self-pay | Admitting: Internal Medicine

## 2023-05-14 ENCOUNTER — Other Ambulatory Visit: Payer: Self-pay

## 2023-05-14 ENCOUNTER — Inpatient Hospital Stay (HOSPITAL_COMMUNITY)
Admission: AD | Admit: 2023-05-14 | Discharge: 2023-05-16 | DRG: 100 | Disposition: A | Discharge status: Home / Self Care | Payer: PPO | Source: Other Acute Inpatient Hospital | Attending: Internal Medicine | Admitting: Internal Medicine

## 2023-05-14 ENCOUNTER — Observation Stay: Payer: PPO

## 2023-05-14 ENCOUNTER — Inpatient Hospital Stay (HOSPITAL_COMMUNITY): Payer: PPO

## 2023-05-14 ENCOUNTER — Encounter (HOSPITAL_COMMUNITY): Payer: Self-pay

## 2023-05-14 ENCOUNTER — Observation Stay
Admission: EM | Admit: 2023-05-14 | Discharge: 2023-05-14 | Disposition: A | Discharge status: Other Acute Inpatient Hospital | Payer: PPO | Attending: Emergency Medicine | Admitting: Emergency Medicine

## 2023-05-14 DIAGNOSIS — N179 Acute kidney failure, unspecified: Secondary | ICD-10-CM | POA: Diagnosis not present

## 2023-05-14 DIAGNOSIS — Z952 Presence of prosthetic heart valve: Secondary | ICD-10-CM | POA: Diagnosis not present

## 2023-05-14 DIAGNOSIS — I6389 Other cerebral infarction: Secondary | ICD-10-CM | POA: Diagnosis not present

## 2023-05-14 DIAGNOSIS — Z79899 Other long term (current) drug therapy: Secondary | ICD-10-CM

## 2023-05-14 DIAGNOSIS — G40409 Other generalized epilepsy and epileptic syndromes, not intractable, without status epilepticus: Principal | ICD-10-CM | POA: Diagnosis present

## 2023-05-14 DIAGNOSIS — Z7902 Long term (current) use of antithrombotics/antiplatelets: Secondary | ICD-10-CM | POA: Insufficient documentation

## 2023-05-14 DIAGNOSIS — I4510 Unspecified right bundle-branch block: Secondary | ICD-10-CM | POA: Diagnosis present

## 2023-05-14 DIAGNOSIS — E785 Hyperlipidemia, unspecified: Secondary | ICD-10-CM | POA: Diagnosis present

## 2023-05-14 DIAGNOSIS — Z87891 Personal history of nicotine dependence: Secondary | ICD-10-CM | POA: Insufficient documentation

## 2023-05-14 DIAGNOSIS — Z7982 Long term (current) use of aspirin: Secondary | ICD-10-CM

## 2023-05-14 DIAGNOSIS — M6282 Rhabdomyolysis: Secondary | ICD-10-CM | POA: Diagnosis present

## 2023-05-14 DIAGNOSIS — I639 Cerebral infarction, unspecified: Principal | ICD-10-CM | POA: Diagnosis present

## 2023-05-14 DIAGNOSIS — R569 Unspecified convulsions: Principal | ICD-10-CM

## 2023-05-14 DIAGNOSIS — Z8673 Personal history of transient ischemic attack (TIA), and cerebral infarction without residual deficits: Secondary | ICD-10-CM

## 2023-05-14 DIAGNOSIS — Q2381 Bicuspid aortic valve: Secondary | ICD-10-CM | POA: Diagnosis not present

## 2023-05-14 DIAGNOSIS — R9431 Abnormal electrocardiogram [ECG] [EKG]: Secondary | ICD-10-CM | POA: Diagnosis not present

## 2023-05-14 DIAGNOSIS — I10 Essential (primary) hypertension: Secondary | ICD-10-CM | POA: Insufficient documentation

## 2023-05-14 LAB — BASIC METABOLIC PANEL
Anion gap: 8 (ref 5–15)
BUN: 15 mg/dL (ref 8–23)
CO2: 23 mmol/L (ref 22–32)
Calcium: 9.7 mg/dL (ref 8.9–10.3)
Chloride: 104 mmol/L (ref 98–111)
Creatinine, Ser: 1.21 mg/dL (ref 0.61–1.24)
GFR, Estimated: >60 mL/min (ref >60)
Glucose, Bld: 115 mg/dL — ABNORMAL HIGH (ref 70–99)
Potassium: 4.6 mmol/L (ref 3.5–5.1)
Sodium: 135 mmol/L (ref 135–145)

## 2023-05-14 LAB — CBC
HCT: 40.3 % (ref 39.0–52.0)
Hemoglobin: 13.8 g/dL (ref 13.0–17.0)
MCH: 30.7 pg (ref 26.0–34.0)
MCHC: 34.2 g/dL (ref 30.0–36.0)
MCV: 89.6 fL (ref 80.0–100.0)
Platelets: 218 10*3/uL (ref 150–400)
RBC: 4.5 MIL/uL (ref 4.22–5.81)
RDW: 13.3 % (ref 11.5–15.5)
WBC: 12.9 10*3/uL — ABNORMAL HIGH (ref 4.0–10.5)
nRBC: 0 % (ref 0.0–0.2)

## 2023-05-14 LAB — CK: Total CK: 400 U/L — ABNORMAL HIGH (ref 49–397)

## 2023-05-14 LAB — CBG MONITORING, ED: Glucose-Capillary: 107 mg/dL — ABNORMAL HIGH (ref 70–99)

## 2023-05-14 LAB — HIV ANTIBODY (ROUTINE TESTING W REFLEX): HIV Screen 4th Generation wRfx: NONREACTIVE

## 2023-05-14 MED ORDER — ENOXAPARIN SODIUM 60 MG/0.6ML IJ SOSY: 50.0000 mg | PREFILLED_SYRINGE | INTRAMUSCULAR | Status: DC

## 2023-05-14 MED ORDER — ACETAMINOPHEN 325 MG PO TABS: 650.0000 mg | ORAL_TABLET | Freq: Four times a day (QID) | ORAL | Status: DC | PRN

## 2023-05-14 MED ORDER — MAGNESIUM HYDROXIDE 400 MG/5ML PO SUSP: 30.0000 mL | Freq: Every day | ORAL | Status: DC | PRN

## 2023-05-14 MED ORDER — TRAZODONE HCL 50 MG PO TABS: 25.0000 mg | ORAL_TABLET | Freq: Every evening | ORAL | Status: DC | PRN

## 2023-05-14 MED ORDER — LEVETIRACETAM ER 500 MG PO TB24: 500.0000 mg | ORAL_TABLET | Freq: Every day | ORAL | Status: DC

## 2023-05-14 MED ORDER — ACETAMINOPHEN 650 MG RE SUPP: 650.0000 mg | Freq: Four times a day (QID) | RECTAL | Status: DC | PRN

## 2023-05-14 MED ORDER — CLOPIDOGREL BISULFATE 75 MG PO TABS
75.0000 mg | ORAL_TABLET | Freq: Every day | ORAL | Status: DC
  Administered 2023-05-14: 75 mg via ORAL
  Filled 2023-05-14: qty 1

## 2023-05-14 MED ORDER — ROSUVASTATIN CALCIUM 5 MG PO TABS
5.0000 mg | ORAL_TABLET | Freq: Every day | ORAL | Status: DC
  Administered 2023-05-15 – 2023-05-16 (×2): 5 mg via ORAL
  Filled 2023-05-14 (×2): qty 1

## 2023-05-14 MED ORDER — KETOROLAC TROMETHAMINE 0.5 % OP SOLN
1.0000 [drp] | Freq: Two times a day (BID) | OPHTHALMIC | Status: DC
  Administered 2023-05-14: 1 [drp] via OPHTHALMIC

## 2023-05-14 MED ORDER — ACETAMINOPHEN 325 MG RE SUPP: 650.0000 mg | Freq: Four times a day (QID) | RECTAL | Status: DC | PRN

## 2023-05-14 MED ORDER — AMLODIPINE BESYLATE 5 MG PO TABS
10.0000 mg | ORAL_TABLET | Freq: Every day | ORAL | Status: DC
  Administered 2023-05-14: 10 mg via ORAL
  Filled 2023-05-14: qty 2

## 2023-05-14 MED ORDER — LEVETIRACETAM IN NACL 1000 MG/100ML IV SOLN
1000.0000 mg | Freq: Once | INTRAVENOUS | Status: AC
  Administered 2023-05-14: 1000 mg via INTRAVENOUS
  Filled 2023-05-14: qty 100

## 2023-05-14 MED ORDER — ADULT MULTIVITAMIN W/MINERALS CH
1.0000 | ORAL_TABLET | Freq: Every day | ORAL | Status: DC
  Administered 2023-05-15 – 2023-05-16 (×2): 1 via ORAL
  Filled 2023-05-14 (×2): qty 1

## 2023-05-14 MED ORDER — LOSARTAN POTASSIUM 50 MG PO TABS
25.0000 mg | ORAL_TABLET | Freq: Every day | ORAL | Status: DC
  Administered 2023-05-14: 25 mg via ORAL
  Filled 2023-05-14: qty 1

## 2023-05-14 MED ORDER — LEVETIRACETAM ER 500 MG PO TB24: 500.0000 mg | ORAL_TABLET | Freq: Two times a day (BID) | ORAL | Status: DC

## 2023-05-14 MED ORDER — PREDNISOLONE ACETATE 1 % OP SUSP
1.0000 [drp] | Freq: Two times a day (BID) | OPHTHALMIC | Status: DC
  Administered 2023-05-14: 1 [drp] via OPHTHALMIC
  Filled 2023-05-14: qty 5

## 2023-05-14 MED ORDER — LOSARTAN POTASSIUM 25 MG PO TABS
25.0000 mg | ORAL_TABLET | Freq: Every day | ORAL | Status: DC
  Administered 2023-05-15: 25 mg via ORAL
  Filled 2023-05-14: qty 1

## 2023-05-14 MED ORDER — ONDANSETRON HCL 4 MG/2ML IJ SOLN: 4.0000 mg | Freq: Four times a day (QID) | INTRAMUSCULAR | Status: DC | PRN

## 2023-05-14 MED ORDER — ENOXAPARIN SODIUM 60 MG/0.6ML IJ SOSY
50.0000 mg | PREFILLED_SYRINGE | INTRAMUSCULAR | Status: DC
  Administered 2023-05-14: 50 mg via SUBCUTANEOUS
  Filled 2023-05-14: qty 0.6

## 2023-05-14 MED ORDER — CLOPIDOGREL BISULFATE 75 MG PO TABS
75.0000 mg | ORAL_TABLET | Freq: Every day | ORAL | Status: DC
  Administered 2023-05-15: 75 mg via ORAL
  Filled 2023-05-14: qty 1

## 2023-05-14 MED ORDER — LEVETIRACETAM ER 500 MG PO TB24
500.0000 mg | ORAL_TABLET | Freq: Every day | ORAL | Status: DC
  Administered 2023-05-14: 500 mg via ORAL
  Filled 2023-05-14: qty 1

## 2023-05-14 MED ORDER — SODIUM CHLORIDE 0.9 % IV SOLN: INTRAVENOUS | Status: DC

## 2023-05-14 MED ORDER — LEVETIRACETAM 500 MG PO TABS
500.0000 mg | ORAL_TABLET | Freq: Two times a day (BID) | ORAL | Status: DC
  Administered 2023-05-14 – 2023-05-16 (×4): 500 mg via ORAL
  Filled 2023-05-14 (×4): qty 1

## 2023-05-14 MED ORDER — ROSUVASTATIN CALCIUM 5 MG PO TABS
5.0000 mg | ORAL_TABLET | Freq: Every day | ORAL | Status: DC
  Administered 2023-05-14: 5 mg via ORAL
  Filled 2023-05-14: qty 1

## 2023-05-14 MED ORDER — ASPIRIN 81 MG PO TBEC
81.0000 mg | DELAYED_RELEASE_TABLET | Freq: Every day | ORAL | Status: DC
  Administered 2023-05-14: 81 mg via ORAL
  Filled 2023-05-14: qty 1

## 2023-05-14 MED ORDER — ONDANSETRON HCL 4 MG PO TABS: 4.0000 mg | ORAL_TABLET | Freq: Four times a day (QID) | ORAL | Status: DC | PRN

## 2023-05-14 MED ORDER — ASPIRIN 81 MG PO TBEC
81.0000 mg | DELAYED_RELEASE_TABLET | Freq: Every day | ORAL | Status: DC
Start: 2023-05-15 — End: 2023-05-16
  Administered 2023-05-15 – 2023-05-16 (×2): 81 mg via ORAL
  Filled 2023-05-14 (×2): qty 1

## 2023-05-14 MED ORDER — KETOROLAC TROMETHAMINE 0.5 % OP SOLN
1.0000 [drp] | Freq: Two times a day (BID) | OPHTHALMIC | Status: DC
  Administered 2023-05-14 – 2023-05-15 (×3): 1 [drp] via OPHTHALMIC
  Filled 2023-05-14: qty 5

## 2023-05-14 MED ORDER — PREDNISOLONE ACETATE 1 % OP SUSP
1.0000 [drp] | Freq: Two times a day (BID) | OPHTHALMIC | Status: DC
  Administered 2023-05-14 – 2023-05-15 (×3): 1 [drp] via OPHTHALMIC
  Filled 2023-05-14: qty 5

## 2023-05-14 MED ORDER — ADULT MULTIVITAMIN W/MINERALS CH
1.0000 | ORAL_TABLET | Freq: Every day | ORAL | Status: DC
  Administered 2023-05-14: 1 via ORAL
  Filled 2023-05-14: qty 1

## 2023-05-14 MED ORDER — LORAZEPAM 2 MG/ML IJ SOLN: 1.0000 mg | INTRAMUSCULAR | Status: DC | PRN

## 2023-05-14 MED ORDER — GADOBUTROL 1 MMOL/ML IV SOLN
10.0000 mL | Freq: Once | INTRAVENOUS | Status: AC | PRN
Start: 2023-05-14 — End: 2023-05-14
  Administered 2023-05-14: 10 mL via INTRAVENOUS

## 2023-05-14 MED ORDER — KETOROLAC TROMETHAMINE 0.5 % OP SOLN
1.0000 [drp] | Freq: Two times a day (BID) | OPHTHALMIC | Status: DC
  Filled 2023-05-14: qty 3

## 2023-05-14 MED ORDER — AMLODIPINE BESYLATE 10 MG PO TABS
10.0000 mg | ORAL_TABLET | Freq: Every day | ORAL | Status: DC
Start: 2023-05-15 — End: 2023-05-16
  Administered 2023-05-15 – 2023-05-16 (×2): 10 mg via ORAL
  Filled 2023-05-14 (×2): qty 1

## 2023-05-14 NOTE — Assessment & Plan Note (Signed)
-   We will continue statin therapy. 

## 2023-05-14 NOTE — Assessment & Plan Note (Signed)
Continue statin therapy.

## 2023-05-14 NOTE — Assessment & Plan Note (Signed)
-

## 2023-05-14 NOTE — Assessment & Plan Note (Signed)
-   He had recurrent seizures for 2 episodes within hours. - The patient will be admitted to the medical telemetry observation bed. - He will be placed on seizure precautions. - We will continue him on Keppra and place him on as needed IV Ativan. - EEG will be obtained. - Neurology consult to be obtained. - I notified Dr. Wilford Corner about the patient.

## 2023-05-14 NOTE — ED Notes (Signed)
Pt apnea reported to RN

## 2023-05-14 NOTE — Progress Notes (Signed)
PHARMACIST - PHYSICIAN COMMUNICATION  CONCERNING:  Enoxaparin (Lovenox) for DVT Prophylaxis    RECOMMENDATION: Patient was prescribed enoxaprin 40mg  q24 hours for VTE prophylaxis.   Filed Weights   05/14/23 0027  Weight: 101.5 kg (223 lb 12.3 oz)    Body mass index is 31.21 kg/m.  Estimated Creatinine Clearance: 63 mL/min (A) (by C-G formula based on SCr of 1.4 mg/dL (H)).   Based on [facility] policy patient is candidate for enoxaparin 0.5mg /kg TBW SQ every 24 hours based on BMI being >30.  DESCRIPTION: Pharmacy has adjusted enoxaparin dose per [facility] policy.  Patient is now receiving enoxaparin 0.5 mg/kg every 24 hours   Otelia Sergeant, PharmD, [facility] 05/14/2023 2:04 AM

## 2023-05-14 NOTE — Assessment & Plan Note (Signed)
Continue blood pressure control with amlodipine and losartan. Start tomorrow.

## 2023-05-14 NOTE — ED Notes (Signed)
EMTALA reviewed by this RN. Paper consent for transfer signed

## 2023-05-14 NOTE — Consult Note (Signed)
NEUROLOGY CONSULT NOTE   Date of service: May 14, 2023 Patient Name: [name] MRN:  [id number] DOB:  March 05, 1956 Chief Complaint: "Seizures" Requesting Provider: Tresa Moore, MD  History of Present Illness  [name] is a 67 y.o. male who has a past medical history of hypertension, dyslipidemia and congenital bicuspid aortic valve status post replacement in 2023 with reported history of strokes that was seen on MRI but no clinical episodes of strokes, presented for evaluation of multiple seizures. He had 1 seizure that brought him to the hospital yesterday, he was back to his baseline, labs and preliminary brain imaging were reassuring and he was discharged home.  He went back home and as he was getting into bed, he suddenly got up and told his wife he does not feel good and had a full-blown generalized tonic-clonic seizure that lasted about 3 minutes.  Both these episodes involved tongue biting.  No bladder incontinence. Wife at bedside reported that he has had episodes of dizziness without clear etiology being identified in the past.  He had had MRIs done for that reason which had revealed that he had had probable strokes in the past which she was unaware of. When I saw him this morning, he had recently eaten his breakfast and was sleeping.  The wife says that he has been more drowsy than usual since his last seizure which was at 11 PM and now around 10 AM, he still remains not fully awake   ROS  Comprehensive ROS performed and pertinent positives documented in HPI  Past History   Past Medical History:  Diagnosis Date   Hypertension     No past surgical history on file.  Family History: No family history on file.  Social History  reports that he quit smoking about 19 months ago. His smoking use included cigarettes. He started smoking about 47 years ago. He has a 46 pack-year smoking history. He has never used smokeless tobacco. He reports current alcohol use. No  history on file for drug use.  Not on File  Medications   Current Facility-Administered Medications:    acetaminophen (TYLENOL) tablet 650 mg, 650 mg, Oral, Q6H PRN **OR** acetaminophen (TYLENOL) suppository 650 mg, 650 mg, Rectal, Q6H PRN, Mansy, Jan A, MD   amLODipine (NORVASC) tablet 10 mg, 10 mg, Oral, Daily, Mansy, Jan A, MD   aspirin EC tablet 81 mg, 81 mg, Oral, Daily, Mansy, Jan A, MD   clopidogrel (PLAVIX) tablet 75 mg, 75 mg, Oral, Daily, Mansy, Jan A, MD   enoxaparin (LOVENOX) injection 50 mg, 50 mg, Subcutaneous, Q24H, Mansy, Jan A, MD   levETIRAcetam (KEPPRA XR) 24 hr tablet 500 mg, 500 mg, Oral, Daily, Mansy, Jan A, MD   LORazepam (ATIVAN) injection 1 mg, 1 mg, Intravenous, Q1H PRN, Mansy, Jan A, MD   magnesium hydroxide (MILK OF MAGNESIA) suspension 30 mL, 30 mL, Oral, Daily PRN, Mansy, Jan A, MD   multivitamin with minerals tablet 1 tablet, 1 tablet, Oral, Daily, Mansy, Jan A, MD   ondansetron (ZOFRAN) tablet 4 mg, 4 mg, Oral, Q6H PRN **OR** ondansetron (ZOFRAN) injection 4 mg, 4 mg, Intravenous, Q6H PRN, Mansy, Jan A, MD   prednisoLONE acetate (PRED FORTE) 1 % ophthalmic suspension 1 drop, 1 drop, Right Eye, BID, Mansy, Jan A, MD   traZODone (DESYREL) tablet 25 mg, 25 mg, Oral, QHS PRN, Mansy, Jan A, MD  Current Outpatient Medications:    amLODipine (NORVASC) 10 MG tablet, Take 10 mg by mouth daily., Disp: ,  Rfl:    aspirin EC 81 MG tablet, Take 81 mg by mouth daily. Swallow whole., Disp: , Rfl:    clopidogrel (PLAVIX) 75 MG tablet, Take 75 mg by mouth daily., Disp: , Rfl:    ketorolac (ACULAR) 0.5 % ophthalmic solution, Place 1 drop into the right eye 2 (two) times daily., Disp: , Rfl:    losartan (COZAAR) 25 MG tablet, Take 25 mg by mouth daily., Disp: , Rfl:    Multiple Vitamins-Minerals (MULTIVITAMIN WITH MINERALS) tablet, Take 1 tablet by mouth daily., Disp: , Rfl:    prednisoLONE acetate (PRED FORTE) 1 % ophthalmic suspension, Place 1 drop into the right eye 6 (six)  times daily. (Patient taking differently: Place 1 drop into the right eye 2 (two) times daily.), Disp: , Rfl:    rosuvastatin (CRESTOR) 5 MG tablet, Take 5 mg by mouth daily., Disp: , Rfl:    atorvastatin (LIPITOR) 80 MG tablet, Take 1 tablet (80 mg total) by mouth daily. (Patient not taking: Reported on 05/14/2023), Disp: , Rfl:   Vitals   Vitals:   05/14/23 0745 05/14/23 0800 05/14/23 0815 05/14/23 0830  BP:  113/78    Pulse: 79 85 89 87  Resp: 16 18 18 18   Temp:      TempSrc:      SpO2: 96% 95% 94% 95%  Weight:      Height:        Body mass index is 31.21 kg/m.  Physical Exam  General: Sleeping in bed comfortably, opens eyes to voice but keeps falling asleep HEENT: Normocephalic atraumatic.  Tongue bite in the midline and on both sides noted. CVS: Regular rhythm Abdomen nondistended nontender Chest clear Neurological exam Comfortably sleeping in bed, opens eyes to voice, participates with exam but keeps falling asleep and staying asleep for most of the time when his care plan was being discussed with his wife. No dysarthria No aphasia Diminished attention concentration Cranial nerve examination: Pupils equal round react light, extract movements intact, visual fields full, face appears symmetric, tongue and palate midline. Motor examination with intact strength all over Sensation intact light touch Coordination with no dysmetria  Labs   CBC:  Recent Labs  Lab 05/13/23 1900 05/14/23 0513  WBC 6.5 12.9*  NEUTROABS 3.5  --   HGB 13.6 13.8  HCT 40.1 40.3  MCV 90.3 89.6  PLT 234 218    Basic Metabolic Panel:  Lab Results  Component Value Date   NA 135 05/14/2023   K 4.6 05/14/2023   CO2 23 05/14/2023   GLUCOSE 115 (H) 05/14/2023   BUN 15 05/14/2023   CREATININE 1.21 05/14/2023   CALCIUM 9.7 05/14/2023   GFRNONAA >60 05/14/2023   Lipid Panel:  Lab Results  Component Value Date   LDLCALC 90 10/12/2021   HgbA1c:  Lab Results  Component Value Date    HGBA1C 5.7 (H) 10/12/2021    CT Head without contrast(Personally reviewed): Unremarkable  Recommendations   [name] is a 67 y.o. male with above past medical history who has had now 2 seizures yesterday within a matter of a few hours and remains somewhat more drowsy than he would expect 12 hours after the last seizure. No clear instigating factor noted. He has been started on Keppra after loading dose. I would recommend that we transfer him to [facility] for LTM EEG since we do not even have spot EEG facilities here for longer-term monitoring.  Clinical exam does not appear for him to be  in status.  I wonder if he is having intermittent seizures which is keeping him more drowsy than expected.  Impression: New onset seizures  Recommendations  Maintain seizure precautions Continue Keppra 500 twice daily MRI brain with and without contrast Frequent neurochecks Ativan for any seizure lasting greater than 5 minutes.  Also inform neurology at that time Transfer to [facility] for LTM EEG If he remains at [facility] hospital overnight and does not have a bed till tomorrow, I would then get a spot EEG when the technologist is available at [facility] hospital tomorrow.  Plan discussed with Dr. Georgeann Oppenheim and on-call neurohospitalist at [facility].  Plan discussed with his wife in person and then over the phone as well.  She prefers that he be transferred to [facility] but bed and availability and inability of [facility] to accept patients unless its higher level of care precludes him from being transferred to [facility]. -- Milon Dikes, MD Neurologist [facility]

## 2023-05-14 NOTE — Assessment & Plan Note (Signed)
The patient will continue his antihypertensive therapy.

## 2023-05-14 NOTE — Assessment & Plan Note (Signed)
Patient with recurrent generalized seizures.   Plan to continue Keppra 500 mg bid per neurology recommendations.  Neuro checks q 4 hrs Seizure precautions.  Follow up with continuous EEG monitoring and further neurology recommendations.

## 2023-05-14 NOTE — Plan of Care (Signed)

## 2023-05-14 NOTE — Progress Notes (Signed)
LTM EEG hooked up and running - no initial skin breakdown - push button tested - [facility] monitoring.  

## 2023-05-14 NOTE — Assessment & Plan Note (Addendum)
MRI with acute small ischemic event at the left occipital cortex.  Plan to continue with aspirin and clopidogrel for 21 days, then continue aspirin alone.  Continue statin therapy and follow up as outpatient.  Echocardiogram with preserved LV systolic function.  EKG sinus rhythm.

## 2023-05-14 NOTE — Plan of Care (Signed)
  Problem: Education: Goal: Knowledge of General Education information will improve Description Including pain rating scale, medication(s)/side effects and non-pharmacologic comfort measures Outcome: Progressing   

## 2023-05-14 NOTE — ED Notes (Signed)
Notified patient's wife via telephone that patient is headed to [place]

## 2023-05-14 NOTE — ED Provider Notes (Signed)
[facility] Provider Note    Event Date/Time   First MD Initiated Contact with Patient 05/14/23 0106     (approximate)   History   Seizures   HPI  [name] is a 67 y.o. male with history of hypertension, hyperlipidemia, congenital bicuspid aortic valve status post replacement in 2023, previous stroke seen on MRI of the brain on Plavix who presents to the emergency department with his second seizure in 24 hours.  Patient reports he had a seizure as a teenager but was never on medication and never saw a neurologist.  He was seen in the emergency department earlier today for generalized tonic-clonic seizure that lasted about 3 to 5 minutes.  He did bite his tongue and was postictal.  Labs, head CT unremarkable and patient was discharged for neurology follow-up.  Once getting home patient had another generalized tonic-clonic seizure lasting 3 to 5 minutes again with tongue biting and postictal state.  Patient is now alert and oriented.  Complaining of mild headache.  No neck or back pain.  No drug or alcohol use.  No fevers, cough, vomiting or diarrhea.   History provided by patient, family, EMS.    Past Medical History:  Diagnosis Date   Hypertension     No past surgical history on file.  MEDICATIONS:  Prior to Admission medications   Medication Sig Start Date End Date Taking? Authorizing Provider  amLODipine (NORVASC) 10 MG tablet Take 10 mg by mouth daily.    [provider]  aspirin EC 81 MG tablet Take 81 mg by mouth daily. Swallow whole.    [provider]  atorvastatin (LIPITOR) 80 MG tablet Take 1 tablet (80 mg total) by mouth daily. Patient not taking: Reported on 01/03/2022 10/13/21   Lurene Shadow, MD  Multiple Vitamins-Minerals (MULTIVITAMIN WITH MINERALS) tablet Take 1 tablet by mouth daily.    [provider]  prednisoLONE acetate (PRED FORTE) 1 % ophthalmic suspension Place 1 drop into the right eye 6 (six) times  daily. 10/12/21   Lurene Shadow, MD    Physical Exam   Triage Vital Signs: ED Triage Vitals  Encounter Vitals Group     BP 05/14/23 0027 127/73     Systolic BP Percentile --      Diastolic BP Percentile --      Pulse Rate 05/14/23 0027 81     Resp 05/14/23 0027 19     Temp 05/14/23 0027 98.3 F (36.8 C)     Temp Source 05/14/23 0027 Oral     SpO2 05/14/23 0026 90 %     Weight 05/14/23 0027 223 lb 12.3 oz (101.5 kg)     Height 05/14/23 0027 5\' 11"  (1.803 m)     Head Circumference --      Peak Flow --      Pain Score 05/14/23 0027 5     Pain Loc --      Pain Education --      Exclude from Growth Chart --     Most recent vital signs: Vitals:   05/14/23 0026 05/14/23 0027  BP:  127/73  Pulse:  81  Resp:  19  Temp:  98.3 F (36.8 C)  SpO2: 90% 90%    CONSTITUTIONAL: Alert, responds appropriately to questions. Well-appearing; well-nourished, oriented x 3 HEAD: Normocephalic, atraumatic EYES: Conjunctivae clear, pupils appear equal, sclera nonicteric ENT: normal nose; moist mucous membranes, superficial abrasions noted to the tip of the tongue NECK: Supple, normal ROM  midline spinal tenderness or step-off or deformity CARD: RRR; S1 and S2 appreciated RESP: Normal chest excursion without splinting or tachypnea; breath sounds clear and equal bilaterally; no wheezes, no rhonchi, no rales, no hypoxia or respiratory distress, speaking full sentences ABD/GI: Non-distended; soft, non-tender, no rebound, no guarding, no peritoneal signs BACK: The back appears normal EXT: Normal ROM in all joints; no deformity noted, no edema SKIN: Normal color for age and race; warm; no rash on exposed skin NEURO: Moves all extremities equally, normal speech, cranial nerves II through XII intact, normal sensation PSYCH: The patient's mood and manner are appropriate.   ED Results / Procedures / Treatments   LABS: (all labs ordered are listed, but only abnormal results are displayed) Labs  Reviewed  CBG MONITORING, ED - Abnormal; Notable for the following components:      Result Value   Glucose-Capillary 107 (*)    All other components within normal limits  CK     EKG:  EKG Interpretation Date/Time:    Ventricular Rate:    PR Interval:    QRS Duration:    QT Interval:    QTC Calculation:   R Axis:      Text Interpretation:           RADIOLOGY: My personal review and interpretation of imaging: CT head unremarkable.  I have personally reviewed all radiology reports.   CT Head Wo Contrast  Result Date: 05/13/2023 CLINICAL DATA:  Seizure EXAM: CT HEAD WITHOUT CONTRAST TECHNIQUE: Contiguous axial images were obtained from the base of the skull through the vertex without intravenous contrast. RADIATION DOSE REDUCTION: This exam was performed according to the departmental dose-optimization program which includes automated exposure control, adjustment of the mA and/or kV according to patient size and/or use of iterative reconstruction technique. COMPARISON:  10/11/2021 FINDINGS: Brain: No evidence of acute infarction, hemorrhage, hydrocephalus, extra-axial collection or mass lesion/mass effect. Vascular: No hyperdense vessel or unexpected calcification. Skull: Normal. Negative for fracture or focal lesion. Sinuses/Orbits: The visualized paranasal sinuses are essentially clear. The mastoid air cells are unopacified. Other: None. IMPRESSION: Normal head CT. Electronically Signed   By: Charline Bills M.D.   On: 05/13/2023 20:14     PROCEDURES:  Critical Care performed: No     .1-3 Lead EKG Interpretation  Performed by: Tena Linebaugh, Layla Maw, DO Authorized by: Georgie Eduardo, Layla Maw, DO     Interpretation: normal     ECG rate:  81   ECG rate assessment: normal     Rhythm: sinus rhythm     Ectopy: none     Conduction: normal       IMPRESSION / MDM / ASSESSMENT AND PLAN / ED COURSE  I reviewed the triage vital signs and the nursing notes.    Patient here with his  second seizure in 24 hours.  The patient is on the cardiac monitor to evaluate for evidence of arrhythmia and/or significant heart rate changes.   DIFFERENTIAL DIAGNOSIS (includes but not limited to):   Seizure, epilepsy, less likely stroke, electrolyte derangement, syncope   Patient's presentation is most consistent with acute presentation with potential threat to life or bodily function.   PLAN: Labs from 7 PM yesterday showed normal hemoglobin, electrolytes.  Creatinine was 1.4.  Normal LFTs.  Head CT reviewed and interpreted by myself and the radiologist and is unremarkable.  Will repeat CBG here and give IV Keppra.  Will discuss with hospitalist for admission.   MEDICATIONS GIVEN IN ED: Medications  levETIRAcetam (KEPPRA)  IVPB 1000 mg/100 mL premix (1,000 mg Intravenous New Bag/Given 05/14/23 0121)     ED COURSE: Blood glucose 107.   CONSULTS:  Consulted and discussed patient's case with hospitalist, Dr. Arville Care.  I have recommended admission and consulting physician agrees and will place admission orders.  Patient (and family if present) agree with this plan.   I reviewed all nursing notes, vitals, pertinent previous records.  All labs, EKGs, imaging ordered have been independently reviewed and interpreted by myself.    OUTSIDE RECORDS REVIEWED: Reviewed last internal medicine visit on 03/06/2023.  Reviewed last neurology visit on 03/01/2023.       FINAL CLINICAL IMPRESSION(S) / ED DIAGNOSES   Final diagnoses:  Seizure (HCC)     Rx / DC Orders   ED Discharge Orders     None        Note:  This document was prepared using Dragon voice recognition software and may include unintentional dictation errors.   Humaira Sculley, Layla Maw, DO 05/14/23 [phone]

## 2023-05-14 NOTE — Discharge Summary (Signed)
Physician Discharge Summary  [name] WUJ:[id number] DOB: [birth year] DOA: 05/14/2023  PCP: Mick Sell, MD  Admit date: 05/14/2023 Discharge date: 05/14/2023  Admitted From: Home Disposition:  Transfer to [facility]  Recommendations for Outpatient Follow-up:  NA   Home Health:NA  Equipment/Devices:None   Discharge Condition:Stable  CODE STATUS:FULL  Diet recommendation: Heart  Brief/Interim Summary:  67 y.o. male with medical history significant for essential hypertension, dyslipidemia and congenital bicuspid status post replacement in 2023 and previous history of CVA, who presented to the emergency room with acute onset of seizure for the second episode in the last 24 hours.  He was seen in the ER earlier yesterday for generalized tonic-clonic seizure lasted about 3 to 5 minutes.  He had a tongue bite then was postictal.  His noncontrasted head CT scan was unremarkable he was discharged home with neurology follow-up.  Shortly after he went home he had another episode of generalized tonic-clonic seizure lasting again about 3 to 5 minutes with tongue biting and postictal state.  He is complaining of mild headache.  No dizziness or blurred vision.  No paresthesias or focal muscle weakness.  No neck pain or stiffness.  No history of alcohol or illicit drug use.  He denied any fever or chills.  No nausea or vomiting or abdominal pain.  No chest pain or palpitations.  No dysuria, oliguria or hematuria or flank pain.   11/3: Case discussed with [facility] neurologist Dr. Wilford Corner.  After evaluation recommendation for patient to transfer to [facility] for continuous EEG monitoring.  Accepting neurologist Dr. Iver Nestle at [facility] made aware.  Patient will transfer to a medical telemetry bed under [facility] hospitalist service with neurology consulting.    Discharge Diagnoses:  Principal Problem:   Seizure [facility]) Active Problems:   Essential hypertension   History of CVA (cerebrovascular accident)    Dyslipidemia  Recurrent seizure Patient had 2 episodes of tonic-clonic seizure within the past 48 hours.  He was loaded with Keppra.  No further seizure activity noted while at [facility].  After consultation with neurology decision made to transfer to [facility] for continuous EEG monitoring.  Accepting neurologist at [facility] Dr. Iver Nestle made aware.  Discharge Instructions  Discharge Instructions     Diet - low sodium heart healthy   Complete by: As directed    Increase activity slowly   Complete by: As directed       Allergies as of 05/14/2023   Not on File      Medication List     STOP taking these medications    atorvastatin 80 MG tablet Commonly known as: LIPITOR       TAKE these medications    amLODipine 10 MG tablet Commonly known as: NORVASC Take 10 mg by mouth daily.   aspirin EC 81 MG tablet Take 81 mg by mouth daily. Swallow whole.   clopidogrel 75 MG tablet Commonly known as: PLAVIX Take 75 mg by mouth daily.   ketorolac 0.5 % ophthalmic solution Commonly known as: ACULAR Place 1 drop into the right eye 2 (two) times daily.   levETIRAcetam 500 MG 24 hr tablet Commonly known as: KEPPRA XR Take 1 tablet (500 mg total) by mouth daily. Start taking on: May 15, 2023   losartan 25 MG tablet Commonly known as: COZAAR Take 25 mg by mouth daily.   multivitamin with minerals tablet Take 1 tablet by mouth daily.   prednisoLONE acetate 1 % ophthalmic suspension Commonly known as: PRED FORTE Place  1 drop into the right eye 6 (six) times daily. What changed: when to take this   rosuvastatin 5 MG tablet Commonly known as: CRESTOR Take 5 mg by mouth daily.        Not on File  Consultations: Neurology   Procedures/Studies: MR BRAIN W WO CONTRAST  Result Date: 05/14/2023 CLINICAL DATA:  Seizures. EXAM: MRI HEAD WITHOUT AND WITH CONTRAST TECHNIQUE: Multiplanar, multiecho pulse sequences of the brain and surrounding structures were obtained  without and with intravenous contrast. CONTRAST:  10mL GADAVIST GADOBUTROL 1 MMOL/ML IV SOLN COMPARISON:  CT head 1 day prior, brain MRI 01/18/2023 FINDINGS: Brain: There is a punctate focus of diffusion restriction in the left occipital lobe cortex which could reflect a tiny acute infarct. There is no other evidence of acute infarct. There is no acute intracranial hemorrhage or extra-axial fluid collection. Parenchymal volume is normal. The ventricles are normal in size. Small remote infarcts in the right centrum semiovale, left frontal subcortical white matter, and right cerebellar hemisphere are unchanged. There are scattered punctate chronic microhemorrhages in the right cerebral hemisphere and left cerebellar hemisphere, unchanged and nonspecific. The corpus callosum is normally formed; however, there is a remote infarct in the splenium which is new since 01/18/2023. The hippocampi are normal in signal and architecture. There is no mass lesion or abnormal enhancement. The pituitary and suprasellar region are normal there is no mass effect or midline shift. Vascular: Normal flow voids. Skull and upper cervical spine: Normal marrow signal. Sinuses/Orbits: The paranasal sinuses are clear. A right lens implant is noted. The globes and orbits are otherwise unremarkable. Other: There is trace fluid in the right mastoid air cells, nonspecific. IMPRESSION: 1. Possible tiny acute infarct in the left occipital cortex. No other evidence of acute intracranial pathology. 2. A small remote infarct in the splenium of the corpus callosum is new since the MRI from 01/18/2023. A few additional small remote infarcts detailed above are unchanged. 3. Scattered punctate chronic microhemorrhages, unchanged and nonspecific in distribution. Electronically Signed   By: Lesia Hausen M.D.   On: 05/14/2023 12:18   CT Head Wo Contrast  Result Date: 05/13/2023 CLINICAL DATA:  Seizure EXAM: CT HEAD WITHOUT CONTRAST TECHNIQUE: Contiguous  axial images were obtained from the base of the skull through the vertex without intravenous contrast. RADIATION DOSE REDUCTION: This exam was performed according to the departmental dose-optimization program which includes automated exposure control, adjustment of the mA and/or kV according to patient size and/or use of iterative reconstruction technique. COMPARISON:  10/11/2021 FINDINGS: Brain: No evidence of acute infarction, hemorrhage, hydrocephalus, extra-axial collection or mass lesion/mass effect. Vascular: No hyperdense vessel or unexpected calcification. Skull: Normal. Negative for fracture or focal lesion. Sinuses/Orbits: The visualized paranasal sinuses are essentially clear. The mastoid air cells are unopacified. Other: None. IMPRESSION: Normal head CT. Electronically Signed   By: Charline Bills M.D.   On: 05/13/2023 20:14      Subjective: Seen and examined on the day of transfer.  Stable.  Aware and understanding of justification for hospital hospital transfer.  Discharge Exam: Vitals:   05/14/23 0830 05/14/23 1211  BP:  136/82  Pulse: 87 73  Resp: 18 18  Temp:    SpO2: 95% 95%   Vitals:   05/14/23 0800 05/14/23 0815 05/14/23 0830 05/14/23 1211  BP: 113/78   136/82  Pulse: 85 89 87 73  Resp: 18 18 18 18   Temp:      TempSrc:      SpO2: 95%  94% 95% 95%  Weight:      Height:        General: Pt is alert, awake, not in acute distress Cardiovascular: RRR, S1/S2 +, no rubs, no gallops Respiratory: CTA bilaterally, no wheezing, no rhonchi Abdominal: Soft, NT, ND, bowel sounds + Extremities: no edema, no cyanosis    The results of significant diagnostics from this hospitalization (including imaging, microbiology, ancillary and laboratory) are listed below for reference.     Microbiology: No results found for this or any previous visit (from the past 240 hour(s)).   Labs: BNP (last 3 results) No results for input(s): "BNP" in the last 8760 hours. Basic Metabolic  Panel: Recent Labs  Lab 05/13/23 1900 05/14/23 0513  NA 136 135  K 4.0 4.6  CL 103 104  CO2 24 23  GLUCOSE 100* 115*  BUN 19 15  CREATININE 1.40* 1.21  CALCIUM 9.2 9.7   Liver Function Tests: Recent Labs  Lab 05/13/23 1900  AST 36  ALT 28  ALKPHOS 52  BILITOT 0.7  PROT 8.1  ALBUMIN 4.4   No results for input(s): "LIPASE", "AMYLASE" in the last 168 hours. No results for input(s): "AMMONIA" in the last 168 hours. CBC: Recent Labs  Lab 05/13/23 1900 05/14/23 0513  WBC 6.5 12.9*  NEUTROABS 3.5  --   HGB 13.6 13.8  HCT 40.1 40.3  MCV 90.3 89.6  PLT 234 218   Cardiac Enzymes: Recent Labs  Lab 05/14/23 0126  CKTOTAL 400*   BNP: Invalid input(s): "POCBNP" CBG: Recent Labs  Lab 05/13/23 1942 05/14/23 0132  GLUCAP 85 107*   D-Dimer No results for input(s): "DDIMER" in the last 72 hours. Hgb A1c No results for input(s): "HGBA1C" in the last 72 hours. Lipid Profile No results for input(s): "CHOL", "HDL", "LDLCALC", "TRIG", "CHOLHDL", "LDLDIRECT" in the last 72 hours. Thyroid function studies No results for input(s): "TSH", "T4TOTAL", "T3FREE", "THYROIDAB" in the last 72 hours.  Invalid input(s): "FREET3" Anemia work up No results for input(s): "VITAMINB12", "FOLATE", "FERRITIN", "TIBC", "IRON", "RETICCTPCT" in the last 72 hours. Urinalysis    Component Value Date/Time   COLORURINE YELLOW (A) 10/11/2021 1345   APPEARANCEUR CLEAR (A) 10/11/2021 1345   LABSPEC 1.013 10/11/2021 1345   PHURINE 5.0 10/11/2021 1345   GLUCOSEU NEGATIVE 10/11/2021 1345   HGBUR NEGATIVE 10/11/2021 1345   BILIRUBINUR NEGATIVE 10/11/2021 1345   KETONESUR NEGATIVE 10/11/2021 1345   PROTEINUR NEGATIVE 10/11/2021 1345   NITRITE NEGATIVE 10/11/2021 1345   LEUKOCYTESUR SMALL (A) 10/11/2021 1345   Sepsis Labs Recent Labs  Lab 05/13/23 1900 05/14/23 0513  WBC 6.5 12.9*   Microbiology No results found for this or any previous visit (from the past 240 hour(s)).   Time  coordinating discharge: Over 30 minutes  SIGNED:   Tresa Moore, MD  [facility] 05/14/2023, 2:10 PM Pager   If 7PM-7AM, please contact night-coverage

## 2023-05-14 NOTE — H&P (Signed)
Media   PATIENT NAME: [name]    MR#:  [id number]  DATE OF BIRTH:  05/21/1956  DATE OF ADMISSION:  05/14/2023  PRIMARY CARE PHYSICIAN: Mick Sell, MD   Patient is coming from: Home  REQUESTING/REFERRING PHYSICIAN: Ward, Stephanie Acre, DO  CHIEF COMPLAINT:   Chief Complaint  Patient presents with   Seizures    HISTORY OF PRESENT ILLNESS:  Mekiah Cambridge is a 67 y.o. male with medical history significant for essential hypertension, dyslipidemia and congenital bicuspid status post replacement in 2023 and previous history of CVA, who presented to the emergency room with acute onset of seizure for the second episode in the last 24 hours.  He was seen in the ER earlier yesterday for generalized tonic-clonic seizure lasted about 3 to 5 minutes.  He had a tongue bite then was postictal.  His noncontrasted head CT scan was unremarkable he was discharged home with neurology follow-up.  Shortly after he went home he had another episode of generalized tonic-clonic seizure lasting again about 3 to 5 minutes with tongue biting and postictal state.  He is complaining of mild headache.  No dizziness or blurred vision.  No paresthesias or focal muscle weakness.  No neck pain or stiffness.  No history of alcohol or illicit drug use.  He denied any fever or chills.  No nausea or vomiting or abdominal pain.  No chest pain or palpitations.  No dysuria, oliguria or hematuria or flank pain.  ED Course: When he came to the ER, vital signs were within normal and later on BP was 143/80 with otherwise normal vital signs.  Labs revealed increase in 1.4 with otherwise unremarkable CMP.  CK was 400 and CBC was within normal. EKG as reviewed by me : Showed sinus rhythm with rate of 80 with right bundle branch block and T wave inversion in V1 and V2. Imaging: Noncontrasted CT scan was within normal.  The patient was given a gram of IV Keppra.  He will be admitted to medical telemetry  observation bed for further evaluation and management.Marland Kitchen PAST MEDICAL HISTORY:   Past Medical History:  Diagnosis Date   Hypertension   Dyslipidemia Congenital bicuspid aortic valve s/p replacement in 2023 CVA  PAST SURGICAL HISTORY:  No past surgical history on file.  No previous surgeries.  SOCIAL HISTORY:   Social History   Tobacco Use   Smoking status: Former    Current packs/day: 0.00    Average packs/day: 1 pack/day for 46.0 years (46.0 ttl pk-yrs)    Types: Cigarettes    Start date: 10/10/1975    Quit date: 10/09/2021    Years since quitting: 1.5   Smokeless tobacco: Never  Substance Use Topics   Alcohol use: Yes    FAMILY HISTORY:  Positive for diabetes mellitus.  DRUG ALLERGIES:  Not on File  REVIEW OF SYSTEMS:   ROS As per history of present illness. All pertinent systems were reviewed above. Constitutional, HEENT, cardiovascular, respiratory, GI, GU, musculoskeletal, neuro, psychiatric, endocrine, integumentary and hematologic systems were reviewed and are otherwise negative/unremarkable except for positive findings mentioned above in the HPI.   MEDICATIONS AT HOME:   Prior to Admission medications   Medication Sig Start Date End Date Taking? Authorizing Provider  amLODipine (NORVASC) 10 MG tablet Take 10 mg by mouth daily.   Yes [provider]  aspirin EC 81 MG tablet Take 81 mg by mouth daily. Swallow whole.   Yes [provider]  clopidogrel (PLAVIX) 75 MG tablet Take 75 mg by mouth daily.   Yes [provider]  ketorolac (ACULAR) 0.5 % ophthalmic solution Place 1 drop into the right eye 2 (two) times daily. 02/02/23  Yes [provider]  losartan (COZAAR) 25 MG tablet Take 25 mg by mouth daily.   Yes [provider]  Multiple Vitamins-Minerals (MULTIVITAMIN WITH MINERALS) tablet Take 1 tablet by mouth daily.   Yes [provider]  prednisoLONE acetate (PRED FORTE) 1 % ophthalmic suspension Place 1 drop  into the right eye 6 (six) times daily. Patient taking differently: Place 1 drop into the right eye 2 (two) times daily. 10/12/21  Yes Lurene Shadow, MD  rosuvastatin (CRESTOR) 5 MG tablet Take 5 mg by mouth daily.   Yes [provider]  atorvastatin (LIPITOR) 80 MG tablet Take 1 tablet (80 mg total) by mouth daily. Patient not taking: Reported on 05/14/2023 10/13/21   Lurene Shadow, MD      VITAL SIGNS:  Blood pressure (!) 143/80, pulse 77, temperature 98.3 F (36.8 C), temperature source Oral, resp. rate 18, height 5\' 11"  (1.803 m), weight 101.5 kg, SpO2 99%.  PHYSICAL EXAMINATION:  Physical Exam  GENERAL:  67 y.o.-year-old male patient lying in the bed with no acute distress.  EYES: Pupils equal, round, reactive to light and accommodation. No scleral icterus. Extraocular muscles intact.  HEENT: Head atraumatic, normocephalic. Oropharynx with tongue tape abrasions and nasopharynx clear.  NECK:  Supple, no jugular venous distention. No thyroid enlargement, no tenderness.  LUNGS: Normal breath sounds bilaterally, no wheezing, rales,rhonchi or crepitation. No use of accessory muscles of respiration.  CARDIOVASCULAR: Regular rate and rhythm, S1, S2 normal. No murmurs, rubs, or gallops.  ABDOMEN: Soft, nondistended, nontender. Bowel sounds present. No organomegaly or mass.  EXTREMITIES: No pedal edema, cyanosis, or clubbing.  NEUROLOGIC: Cranial nerves II through XII are intact. Muscle strength 5/5 in all extremities. Sensation intact. Gait not checked.  PSYCHIATRIC: The patient is alert and oriented x 3.  Normal affect and good eye contact. SKIN: No obvious rash, lesion, or ulcer.   LABORATORY PANEL:   CBC Recent Labs  Lab 05/14/23 0513  WBC 12.9*  HGB 13.8  HCT 40.3  PLT 218   ------------------------------------------------------------------------------------------------------------------  Chemistries  Recent Labs  Lab 05/13/23 1900  NA 136  K 4.0  CL 103  CO2 24   GLUCOSE 100*  BUN 19  CREATININE 1.40*  CALCIUM 9.2  AST 36  ALT 28  ALKPHOS 52  BILITOT 0.7   ------------------------------------------------------------------------------------------------------------------  Cardiac Enzymes No results for input(s): "TROPONINI" in the last 168 hours. ------------------------------------------------------------------------------------------------------------------  RADIOLOGY:  CT Head Wo Contrast  Result Date: 05/13/2023 CLINICAL DATA:  Seizure EXAM: CT HEAD WITHOUT CONTRAST TECHNIQUE: Contiguous axial images were obtained from the base of the skull through the vertex without intravenous contrast. RADIATION DOSE REDUCTION: This exam was performed according to the departmental dose-optimization program which includes automated exposure control, adjustment of the mA and/or kV according to patient size and/or use of iterative reconstruction technique. COMPARISON:  10/11/2021 FINDINGS: Brain: No evidence of acute infarction, hemorrhage, hydrocephalus, extra-axial collection or mass lesion/mass effect. Vascular: No hyperdense vessel or unexpected calcification. Skull: Normal. Negative for fracture or focal lesion. Sinuses/Orbits: The visualized paranasal sinuses are essentially clear. The mastoid air cells are unopacified. Other: None. IMPRESSION: Normal head CT. Electronically Signed   By: Charline Bills M.D.   On: 05/13/2023 20:14      IMPRESSION AND PLAN:  Assessment and Plan: *  Seizure (HCC) - He had recurrent seizures for 2 episodes within hours. - The patient will be admitted to the medical telemetry observation bed. - He will be placed on seizure precautions. - We will continue him on Keppra and place him on as needed IV Ativan. - EEG will be obtained. - Neurology consult to be obtained. - I notified Dr. Wilford Corner about the patient.  Dyslipidemia - We will continue statin therapy.  History of CVA (cerebrovascular accident) - We will continue  aspirin and Plavix as well as statin therapy.  Essential hypertension The patient will continue his antihypertensive therapy.     DVT prophylaxis: Lovenox.  Advanced Care Planning:  Code Status: full code.  Family Communication:  The plan of care was discussed in details with the patient (and family). I answered all questions. The patient agreed to proceed with the above mentioned plan. Further management will depend upon hospital course. Disposition Plan: Back to previous home environment Consults called: Neurology. All the records are reviewed and case discussed with ED provider.  Status is: Observation  I certify that at the time of admission, it is my clinical judgment that the patient will require  hospital care extending less than 2 midnights.                            Dispo: The patient is from: Home              Anticipated d/c is to: Home              Patient currently is not medically stable to d/c.              Difficult to place patient: No  Hannah Beat M.D on 05/14/2023 at 5:52 AM  [facility]   From 7 PM-7 AM, contact night-coverage [email or web address]  CC: Primary care physician; Mick Sell, MD

## 2023-05-14 NOTE — H&P (Addendum)
History and Physical    Patient: [name] ZOX:[id number] DOB: 01-May-1956 DOA: 05/14/2023 DOS: the patient was seen and examined on 05/14/2023 PCP: Mick Sell, MD  Patient coming from:  [facility]  Chief Complaint: No chief complaint on file.  HPI: [name] is a 67 y.o. male with medical history significant of hypertension, dyslipidemia, history of CVA and congenital bicuspid aortic valve who was transferred from [facility] due to seizures.   Patient was admitted to [facility] this morning after having recurrent generalized tonic clonic seizures. First event on 11/02 during the evening hours. It occurred while he was seating in a chair. He had a brief prodromal symptom of dizziness and then developed a generalized seizure, tonic clonic event which lasted 3 to 5 minutes. He had tongue biting and experienced post ictal state.  EMS was called and he was transported to the ED in [facility].  He was evaluated in the ED, work up with head CT was negative for acute changes and then he was discharged home with instructions to follow up with Neurology.  Around midnight the same day, Nov 02, while patient has at home in bed  he developed another episode of generalized tonic clonic seizure, lasting again 3 to 5 minutes. Again tongue biting and post ictal state.  His wife called EMS and he was taken back to the ED.  Neurology was consulted and recommended Keppra 500 mg bid and transfer to [facility] for LTM EEG.   At the time of my examination he is feeling well, has no headache, no neck pain, no chest pain, no dyspnea or cough.   He has been compliant with his medications at home and denies any recent change in his routine. He works in Education officer, environmental.      Review of Systems: As mentioned in the history of present illness. All other systems reviewed and are negative. Past Medical History:  Diagnosis Date   Hypertension    No past surgical history on file. Social History:  reports that he quit smoking about 19 months  ago. His smoking use included cigarettes. He started smoking about 47 years ago. He has a 46 pack-year smoking history. He has never used smokeless tobacco. He reports current alcohol use. No history on file for drug use.  Not on File  No family history on file.  Prior to Admission medications   Medication Sig Start Date End Date Taking? Authorizing Provider  amLODipine (NORVASC) 10 MG tablet Take 10 mg by mouth daily.    [provider]  aspirin EC 81 MG tablet Take 81 mg by mouth daily. Swallow whole.    [provider]  clopidogrel (PLAVIX) 75 MG tablet Take 75 mg by mouth daily.    [provider]  ketorolac (ACULAR) 0.5 % ophthalmic solution Place 1 drop into the right eye 2 (two) times daily. 02/02/23   [provider]  levETIRAcetam (KEPPRA XR) 500 MG 24 hr tablet Take 1 tablet (500 mg total) by mouth daily. 05/15/23   Sreenath, Jonelle Sports, MD  losartan (COZAAR) 25 MG tablet Take 25 mg by mouth daily.    [provider]  Multiple Vitamins-Minerals (MULTIVITAMIN WITH MINERALS) tablet Take 1 tablet by mouth daily.    [provider]  prednisoLONE acetate (PRED FORTE) 1 % ophthalmic suspension Place 1 drop into the right eye 6 (six) times daily. Patient taking differently: Place 1 drop into the right eye 2 (two) times daily. 10/12/21   Lurene Shadow, MD  rosuvastatin (CRESTOR)  5 MG tablet Take 5 mg by mouth daily.    [provider]    Physical Exam: Vitals:   05/14/23 1522  BP: 115/61  Pulse: 69  Resp: 20  Temp: 98.2 F (36.8 C)  TempSrc: Oral  SpO2: 98%   Neurology awake and alert, strength is 5/5 all four extremities, coordination is preserved bilaterally. Cranial nerves 2 to 12 are no impaired.  ENT with no pallor Cardiovascular with S1 and S2 present and regular with no gallops, rubs or murmurs Respiratory with no rales or wheezing, no rhonchi Abdomen with no distention  No lower extremity edema No rashes.  Data  Reviewed:   Na 135. K 4.6 Cl 104 bicarbonate 23, glucose 115 bun 15 cr 1.21 Wbc 12.9 hgb 13.8 plt 218   Head CT with no acute changes.   Brain MRI with tiny acute infarct in the left occipital cortex. No other evidence of acute intracranial pathology.  A small remote infarct in the splenium of the corpus callosum is new since MRI from 01/18/2023. A few additional small remote infarcts. Scattered punctate chronic micro-hemorrhages, unchanged and nonspecific in distribution.   EKG 80 bpm, normal axis, right bundle branch block, qtc not prolonged, sinus rhythm with no significant ST segment or T wave changes.   Assessment and Plan: * Seizures (HCC) Patient with recurrent generalized seizures.   Plan to continue Keppra 500 mg bid per neurology recommendations.  Neuro checks q 4 hrs Seizure precautions.  Follow up with continuous EEG monitoring and further neurology recommendations.   CVA (cerebral vascular accident) [facility]) MRI with acute small ischemic event at the left occipital cortex.  Continue blood pressure monitoring.  Patient on statin and aspirin/ clopidogrel.  Follow up with neurology recommendations for further Brain imaging.  Will get echocardiogram. Continue telemetry monitoring.    Essential hypertension Continue blood pressure control with amlodipine and losartan. Start tomorrow.   Dyslipidemia Continue statin therapy.     Advance Care Planning:   Code Status: Full Code   Consults: Neurology   Family Communication: I spoke with patient's family at the bedside, we talked in detail about patient's condition, plan of care and prognosis and all questions were addressed.   Severity of Illness: The appropriate patient status for this patient is INPATIENT. Inpatient status is judged to be reasonable and necessary in order to provide the required intensity of service to ensure the patient's safety. The patient's presenting symptoms, physical exam findings, and initial  radiographic and laboratory data in the context of their chronic comorbidities is felt to place them at high risk for further clinical deterioration. Furthermore, it is not anticipated that the patient will be medically stable for discharge from the hospital within 2 midnights of admission.   * I certify that at the point of admission it is my clinical judgment that the patient will require inpatient hospital care spanning beyond 2 midnights from the point of admission due to high intensity of service, high risk for further deterioration and high frequency of surveillance required.*  Author: Coralie Keens, MD 05/14/2023 3:51 PM  For on call review [email or web address].

## 2023-05-14 NOTE — ED Triage Notes (Signed)
Patient discharged earlier tonight hand another witnessed seizure by wife while patient was sleeping. Brought in by ems,  patient complaining of a headache

## 2023-05-14 NOTE — ED Notes (Signed)
Patient given a breakfast tray.

## 2023-05-15 ENCOUNTER — Other Ambulatory Visit (HOSPITAL_COMMUNITY): Payer: Self-pay

## 2023-05-15 ENCOUNTER — Inpatient Hospital Stay (HOSPITAL_COMMUNITY): Payer: PPO

## 2023-05-15 DIAGNOSIS — I10 Essential (primary) hypertension: Secondary | ICD-10-CM | POA: Diagnosis not present

## 2023-05-15 DIAGNOSIS — I6389 Other cerebral infarction: Secondary | ICD-10-CM

## 2023-05-15 DIAGNOSIS — E785 Hyperlipidemia, unspecified: Secondary | ICD-10-CM | POA: Diagnosis not present

## 2023-05-15 DIAGNOSIS — I639 Cerebral infarction, unspecified: Secondary | ICD-10-CM | POA: Diagnosis not present

## 2023-05-15 DIAGNOSIS — R569 Unspecified convulsions: Secondary | ICD-10-CM | POA: Diagnosis not present

## 2023-05-15 LAB — ECHOCARDIOGRAM COMPLETE
AR max vel: 2.76 cm2
AV Area VTI: 3.27 cm2
AV Area mean vel: 3.2 cm2
AV Mean grad: 3 mm[Hg]
AV Peak grad: 5.3 mm[Hg]
Ao pk vel: 1.15 m/s
Area-P 1/2: 2.37 cm2
Height: 71 in
S' Lateral: 2.2 cm
Weight: 3580.27 [oz_av]

## 2023-05-15 LAB — CBC
HCT: 41.2 % (ref 39.0–52.0)
Hemoglobin: 13.7 g/dL (ref 13.0–17.0)
MCH: 29.9 pg (ref 26.0–34.0)
MCHC: 33.3 g/dL (ref 30.0–36.0)
MCV: 90 fL (ref 80.0–100.0)
Platelets: 204 10*3/uL (ref 150–400)
RBC: 4.58 MIL/uL (ref 4.22–5.81)
RDW: 13.3 % (ref 11.5–15.5)
WBC: 7.2 10*3/uL (ref 4.0–10.5)
nRBC: 0 % (ref 0.0–0.2)

## 2023-05-15 LAB — BASIC METABOLIC PANEL
Anion gap: 9 (ref 5–15)
Anion gap: 9 (ref 5–15)
BUN: 17 mg/dL (ref 8–23)
BUN: 22 mg/dL (ref 8–23)
CO2: 24 mmol/L (ref 22–32)
CO2: 23 mmol/L (ref 22–32)
Calcium: 9.1 mg/dL (ref 8.9–10.3)
Calcium: 9 mg/dL (ref 8.9–10.3)
Chloride: 103 mmol/L (ref 98–111)
Chloride: 103 mmol/L (ref 98–111)
Creatinine, Ser: 1.65 mg/dL — ABNORMAL HIGH (ref 0.61–1.24)
Creatinine, Ser: 1.73 mg/dL — ABNORMAL HIGH (ref 0.61–1.24)
GFR, Estimated: 46 mL/min — ABNORMAL LOW (ref >60)
GFR, Estimated: 43 mL/min — ABNORMAL LOW (ref >60)
Glucose, Bld: 106 mg/dL — ABNORMAL HIGH (ref 70–99)
Glucose, Bld: 91 mg/dL (ref 70–99)
Potassium: 4.3 mmol/L (ref 3.5–5.1)
Potassium: 4 mmol/L (ref 3.5–5.1)
Sodium: 136 mmol/L (ref 135–145)
Sodium: 135 mmol/L (ref 135–145)

## 2023-05-15 LAB — RAPID URINE DRUG SCREEN, HOSP PERFORMED
Amphetamines: NOT DETECTED
Barbiturates: NOT DETECTED
Benzodiazepines: NOT DETECTED
Cocaine: NOT DETECTED
Opiates: NOT DETECTED
Tetrahydrocannabinol: NOT DETECTED

## 2023-05-15 MED ORDER — POLYETHYLENE GLYCOL 3350 17 G PO PACK
17.0000 g | PACK | Freq: Every day | ORAL | Status: DC
Start: 2023-05-15 — End: 2023-05-16
  Administered 2023-05-15 – 2023-05-16 (×2): 17 g via ORAL
  Filled 2023-05-15 (×2): qty 1

## 2023-05-15 MED ORDER — CLONAZEPAM 1 MG PO TABS: 2.0000 mg | ORAL_TABLET | Freq: Once | ORAL | Status: DC | PRN

## 2023-05-15 MED ORDER — BISACODYL 5 MG PO TBEC
5.0000 mg | DELAYED_RELEASE_TABLET | Freq: Once | ORAL | Status: AC
  Administered 2023-05-15: 5 mg via ORAL
  Filled 2023-05-15: qty 1

## 2023-05-15 MED ORDER — CLOPIDOGREL BISULFATE 75 MG PO TABS: 75.0000 mg | ORAL_TABLET | Freq: Every day | ORAL | Status: AC

## 2023-05-15 MED ORDER — SODIUM CHLORIDE 0.9 % IV SOLN: INTRAVENOUS | Status: DC

## 2023-05-15 MED ORDER — LEVETIRACETAM 500 MG PO TABS
500.0000 mg | ORAL_TABLET | Freq: Two times a day (BID) | ORAL | 0 refills | Status: DC
  Filled 2023-05-15: qty 60, 30d supply, fill #0

## 2023-05-15 MED ORDER — CLONAZEPAM 1 MG PO TABS
2.0000 mg | ORAL_TABLET | Freq: Once | ORAL | 0 refills | Status: DC | PRN
  Filled 2023-05-15: qty 10, 5d supply, fill #0

## 2023-05-15 MED ORDER — CLOPIDOGREL BISULFATE 75 MG PO TABS
75.0000 mg | ORAL_TABLET | Freq: Every day | ORAL | Status: DC
  Administered 2023-05-16: 75 mg via ORAL
  Filled 2023-05-15: qty 1

## 2023-05-15 MED ORDER — SODIUM CHLORIDE 0.9 % IV BOLUS
500.0000 mL | Freq: Once | INTRAVENOUS | Status: AC
  Administered 2023-05-15: 500 mL via INTRAVENOUS

## 2023-05-15 NOTE — Procedures (Addendum)
Patient Name: [name]  MRN: [id number]  Epilepsy Attending: Charlsie Quest  Referring Physician/Provider: Milon Dikes, MD  Duration: 05/14/2023 1620 to 05/15/2023 1115  Patient history:  67 y.o. male who has a past medical history of hypertension, dyslipidemia and congenital bicuspid aortic valve status post replacement in 2023 with reported history of strokes that was seen on MRI but no clinical episodes of strokes, presented for evaluation of multiple seizures. EEG to evaluate for seizure  Level of alertness: Awake, asleep  AEDs during EEG study: LEV  Technical aspects: This EEG study was done with scalp electrodes positioned according to the 10-20 International system of electrode placement. Electrical activity was reviewed with band pass filter of 1-70Hz , sensitivity of 7 uV/mm, display speed of 80mm/sec with a 60Hz  notched filter applied as appropriate. EEG data were recorded continuously and digitally stored.  Video monitoring was available and reviewed as appropriate.  Description: The posterior dominant rhythm consists of 8.5 Hz activity of moderate voltage (25-35 uV) seen predominantly in posterior head regions, symmetric and reactive to eye opening and eye closing. Sleep was characterized by vertex waves, sleep spindles (12 to 14 Hz), maximal frontocentral region. Hyperventilation and photic stimulation were not performed.     IMPRESSION: This study is within normal limits. No seizures or epileptiform discharges were seen throughout the recording.  A normal interictal EEG does not exclude the diagnosis of epilepsy.  Gabino Hagin Annabelle Harman

## 2023-05-15 NOTE — Progress Notes (Signed)
Subjective: No seizures overnight.  Per patient and wife at bedside is back to baseline.  Denies any seizure provoking factors.  ROS: negative except above  Examination  Vital signs in last 24 hours: Temp:  [98.2 F (36.8 C)-98.7 F (37.1 C)] 98.3 F (36.8 C) (11/04 0500) Pulse Rate:  [62-75] 72 (11/04 0500) Resp:  [14-20] 16 (11/04 0500) BP: (93-136)/(53-82) 110/71 (11/04 0500) SpO2:  [95 %-100 %] 100 % (11/04 0500)  General: lying in bed, NAD Neuro: MS: Alert, oriented, follows commands CN: pupils equal and reactive,  EOMI, face symmetric, tongue midline, normal sensation over face, Motor: 5/5 strength in all 4 extremities Coordination: normal Gait: not tested  Basic Metabolic Panel: Recent Labs  Lab 05/13/23 1900 05/14/23 0513 05/15/23 0621  NA 136 135 136  K 4.0 4.6 4.3  CL 103 104 103  CO2 24 23 24   GLUCOSE 100* 115* 106*  BUN 19 15 17   CREATININE 1.40* 1.21 1.65*  CALCIUM 9.2 9.7 9.1    CBC: Recent Labs  Lab 05/13/23 1900 05/14/23 0513 05/15/23 0621  WBC 6.5 12.9* 7.2  NEUTROABS 3.5  --   --   HGB 13.6 13.8 13.7  HCT 40.1 40.3 41.2  MCV 90.3 89.6 90.0  PLT 234 218 204     Coagulation Studies: No results for input(s): "LABPROT", "INR" in the last 72 hours.  Imaging MRI brain with and without contrast 05/14/2023: Possible tiny acute infarct in the left occipital cortex. No other evidence of acute intracranial pathology. A small remote infarct in the splenium of the corpus callosum is new since the MRI from 01/18/2023. A few additional small remote infarcts detailed above are unchanged. Scattered punctate chronic microhemorrhages, unchanged and nonspecific in distribution.  TTE 05/15/2023: Left ventricular ejection fraction 60 to 65%, no regional wall motion abnormalities, no thrombus, no PFO    ASSESSMENT AND PLAN   Epilepsy Rhabdomyolysis -No clear etiology for seizures.  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  Therefore  will order UDS -Ideally would like to get CT chest Abdo pelvis to rule out a neoplastic etiology.  However patient already has AKI.  Therefore would recommend obtaining this as an outpatient -No seizures overnight, therefore DC LTM EEG -Continue Keppra 500 mg twice daily -Rescue medication: Clonazepam 2 mg for seizure lasting more than 2 minutes.  Intranasal [place] is over $1,000. -Seizure precautions including do not drive discussed with wife and patient at bedside -as needed IV Versed for clinical seizures while inpatient -Discussed plan with Dr. Ella Jubilee  Acute ischemic stroke (incidental) -Etiology: Likely small vessel disease -Continue aspirin 81 mg daily.  -Plavix 75 mg daily for 21 days only - LDL 69.  Continue rosuvastatin 5 mg daily -Follow-up with neurology in 2 to 3 months .  Patient is from [place].  Therefore we will follow-up with [facility] clinic.  Wife states she will call the clinic for follow-up appointment.  Seizure precautions: Per [facility] statutes, patients with seizures are not allowed to drive until they have been seizure-free for six months and cleared by a physician    Use caution when using heavy equipment or power tools. Avoid working on ladders or at heights. Take showers instead of baths. Ensure the water temperature is not too high on the home water heater. Do not go swimming alone. Do not lock yourself in a room alone (i.e. bathroom). When caring for infants or small children, sit down when holding, feeding, or changing them to minimize  risk of injury to the child in the event you have a seizure. Maintain good sleep hygiene. Avoid alcohol.    If patient has another seizure, call 911 and bring them back to the ED if: A.  The seizure lasts longer than 5 minutes.      B.  The patient doesn't wake shortly after the seizure or has new problems such as difficulty seeing, speaking or moving following the seizure C.  The patient was injured during the  seizure D.  The patient has a temperature over 102 F (39C) E.  The patient vomited during the seizure and now is having trouble breathing    During the Seizure   - First, ensure adequate ventilation and place patients on the floor on their left side  Loosen clothing around the neck and ensure the airway is patent. If the patient is clenching the teeth, do not force the mouth open with any object as this can cause severe damage - Remove all items from the surrounding that can be hazardous. The patient may be oblivious to what's happening and may not even know what he or she is doing. If the patient is confused and wandering, either gently guide him/her away and block access to outside areas - Reassure the individual and be comforting - Call 911. In most cases, the seizure ends before EMS arrives. However, there are cases when seizures may last over 3 to 5 minutes. Or the individual may have developed breathing difficulties or severe injuries. If a pregnant patient or a person with diabetes develops a seizure, it is prudent to call an ambulance.   After the Seizure (Postictal Stage)   After a seizure, most patients experience confusion, fatigue, muscle pain and/or a headache. Thus, one should permit the individual to sleep. For the next few days, reassurance is essential. Being calm and helping reorient the person is also of importance.   Most seizures are painless and end spontaneously. Seizures are not harmful to others but can lead to complications such as stress on the lungs, brain and the heart. Individuals with prior lung problems may develop labored breathing and respiratory distress.    I have spent a total of  36 minutes with the patient reviewing hospital notes,  test results, labs and examining the patient as well as establishing an assessment and plan that was discussed personally with the patient.  > 50% of time was spent in direct patient care.    Lindie Spruce [facility] For questions after 5pm please refer to AMION to reach the Neurologist on call

## 2023-05-15 NOTE — Hospital Course (Signed)
Euel Castile was admitted to the hospital with the working diagnosis of uncontrolled seizures.   67 y.o. male with medical history significant of hypertension, dyslipidemia, history of CVA and congenital bicuspid aortic valve who was transferred from [facility] due to seizures.    Patient was admitted to [facility] after having recurrent generalized tonic clonic seizures. First event on 11/02 during the evening hours. It occurred while he was seating in a chair. He had a brief prodromal symptom of dizziness and then developed a generalized seizure, tonic clonic event which lasted 3 to 5 minutes. He had tongue biting and experienced post ictal state.  EMS was called and he was transported to the ED in [facility].  He was evaluated in the ED, work up with head CT was negative for acute changes and then he was discharged home with instructions to follow up with Neurology.  Around midnight the same day, Nov 02, while patient has at home in bed  he developed another episode of generalized tonic clonic seizure, lasting again 3 to 5 minutes. Again tongue biting and post ictal state.  His wife called EMS and he was taken back to the ED.   In [facility] Neurology was consulted and recommended Keppra 500 mg bid and transfer to [facility] for LTM EEG.    At the time of his transfer he was hemodynamically stable, his was awake and alert. His physical examination was unremarkable and he was neurologically intact.   Na 135. K 4.6 Cl 104 bicarbonate 23, glucose 115 bun 15 cr 1.21 Wbc 12.9 hgb 13.8 plt 218    Head CT with no acute changes.    Brain MRI with tiny acute infarct in the left occipital cortex. No other evidence of acute intracranial pathology.  A small remote infarct in the splenium of the corpus callosum is new since MRI from 01/18/2023. A few additional small remote infarcts. Scattered punctate chronic micro-hemorrhages, unchanged and nonspecific in distribution.    EKG 80 bpm, normal axis, right bundle branch block, qtc not  prolonged, sinus rhythm with no significant ST segment or T wave changes.   Patient was placed on long term EEG monitoring, which was negative for active seizures.  He remained seizure free.  Decision to continue keppra 500 mg po bid and follow up as outpatient

## 2023-05-15 NOTE — Plan of Care (Signed)
  Problem: Clinical Measurements: Goal: Diagnostic test results will improve Outcome: Not Progressing   

## 2023-05-15 NOTE — Plan of Care (Signed)
  Problem: Education: Goal: Knowledge of General Education information will improve Description: Including pain rating scale, medication(s)/side effects and non-pharmacologic comfort measures 05/15/2023 2004 by Cottie Banda, Mardelle Matte, RN Outcome: Completed/Met 05/15/2023 2003 by Cottie Banda, Mardelle Matte, RN Outcome: Not Progressing   Problem: Health Behavior/Discharge Planning: Goal: Ability to manage health-related needs will improve 05/15/2023 2004 by Cottie Banda, Mardelle Matte, RN Outcome: Completed/Met 05/15/2023 2003 by Cottie Banda, Mardelle Matte, RN Outcome: Not Progressing   Problem: Clinical Measurements: Goal: Ability to maintain clinical measurements within normal limits will improve 05/15/2023 2004 by Cottie Banda, Mardelle Matte, RN Outcome: Completed/Met 05/15/2023 2003 by Cottie Banda, Mardelle Matte, RN Outcome: Not Progressing Goal: Will remain free from infection 05/15/2023 2004 by Cottie Banda, Mardelle Matte, RN Outcome: Completed/Met 05/15/2023 2003 by Cottie Banda, Mardelle Matte, RN Outcome: Not Progressing Goal: Diagnostic test results will improve 05/15/2023 2004 by Maryjean Morn, RN Outcome: Completed/Met 05/15/2023 2003 by Cottie Banda, Mardelle Matte, RN Outcome: Not Progressing Goal: Respiratory complications will improve 05/15/2023 2004 by Maryjean Morn, RN Outcome: Completed/Met 05/15/2023 2003 by Cottie Banda, Mardelle Matte, RN Outcome: Not Progressing Goal: Cardiovascular complication will be avoided 05/15/2023 2004 by Maryjean Morn, RN Outcome: Completed/Met 05/15/2023 2003 by Cottie Banda, Mardelle Matte, RN Outcome: Not Progressing   Problem: Nutrition: Goal: Adequate nutrition will be maintained 05/15/2023 2004 by Cottie Banda, Mardelle Matte, RN Outcome: Completed/Met 05/15/2023 2003 by Cottie Banda, Mardelle Matte, RN Outcome: Not Progressing   Problem: Activity: Goal: Risk for activity intolerance will decrease 05/15/2023 2004 by Cottie Banda, Mardelle Matte,  RN Outcome: Completed/Met 05/15/2023 2003 by Cottie Banda, Mardelle Matte, RN Outcome: Not Progressing   Problem: Coping: Goal: Level of anxiety will decrease 05/15/2023 2004 by Maryjean Morn, RN Outcome: Completed/Met 05/15/2023 2003 by Cottie Banda, Mardelle Matte, RN Outcome: Not Progressing   Problem: Pain Management: Goal: General experience of comfort will improve 05/15/2023 2004 by Maryjean Morn, RN Outcome: Completed/Met 05/15/2023 2003 by Cottie Banda, Mardelle Matte, RN Outcome: Not Progressing

## 2023-05-15 NOTE — Evaluation (Signed)
Occupational Therapy Evaluation Patient Details Name: [name] MRN: [id number] DOB: 07/28/1955 Today's Date: 05/15/2023   History of Present Illness The pt is a 67 yo male presenting 11/2 after a seizure witnessed by family at home, imaging clear, and pt d/c home. He returned later that night after a second seizure, transferred to [facility] 11/3 for LTM EEG. 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 Patient independent in ADL and IADL management. Currently, patient presenting with minimal post ictal state, as evidenced by minor difficulty with [facility] sequencing to assess upper level cognition, and minimal increased assist to complete ADLs. Patient contact gaurd for ADLs, and handed off to PT to assess mobility at end of session. Patient with no need for OT follow up at discharge, with OT following acutely.      If plan is discharge home, recommend the following: Direct supervision/assist for medications management;Assist for transportation (initially)    Functional Status Assessment  Patient has had a recent decline in their functional status and demonstrates the ability to make significant improvements in function in a reasonable and predictable amount of time.  Equipment Recommendations  None recommended by OT    Recommendations for Other Services       Precautions / Restrictions Precautions Precautions: Fall;Other (comment) Precaution Comments: Seizure precuations      Mobility Bed Mobility Overal bed mobility: Needs Assistance Bed Mobility: Supine to Sit     Supine to sit: Supervision     General bed mobility comments: supervision for safety    Transfers                   General transfer comment: handed off to PT for assessment      Balance  Overall balance assessment: Mild deficits observed, not formally tested                                         ADL either performed or assessed with clinical judgement   ADL Overall ADL's : Needs assistance/impaired Eating/Feeding: Set up;Sitting   Grooming: Set up;Sitting   Upper Body Bathing: Set up;Sitting   Lower Body Bathing: Contact guard assist;Sit to/from stand;Sitting/lateral leans   Upper Body Dressing : Set up;Sitting   Lower Body Dressing: Contact guard assist;Sit to/from stand;Sitting/lateral leans     Toilet Transfer Details (indicate cue type and reason): refer to PT eval, handed off to PT while sitting EOB Toileting- Clothing Manipulation and Hygiene: Set up;Sitting/lateral lean;Sit to/from stand       Functional mobility during ADLs: Contact guard assist General ADL Comments: Patient presenting with minimal post ictal state, as evidenced by minor difficulty with [place] sequencing to assess upper level cognition, and minimal increased assist to complete ADLs. Patient contact gaurd for ADLs, and handed off to PT to assess mobility at end of session. Patient with no need for OT follow up at discharge, with OT following acutely.     Vision Baseline Vision/History: 1 Wears glasses (Readers) Ability to See in Adequate Light: 0 Adequate Patient Visual Report: No change from baseline Vision Assessment?: Yes Eye Alignment: Within Functional Limits Ocular Range of Motion: Within Functional Limits Alignment/Gaze Preference: Within Defined Limits Tracking/Visual Pursuits: Able to track stimulus in  all quads without difficulty Saccades: Within functional limits Convergence: Within functional limits Visual Fields: No apparent deficits     Perception Perception: Within Functional Limits       Praxis Praxis: WFL       Pertinent Vitals/Pain Pain Assessment Pain Assessment: No/denies pain     Extremity/Trunk Assessment Upper Extremity  Assessment Upper Extremity Assessment: Overall WFL for tasks assessed;Right hand dominant   Lower Extremity Assessment Lower Extremity Assessment: Defer to PT evaluation   Cervical / Trunk Assessment Cervical / Trunk Assessment: Normal   Communication Communication Communication: No apparent difficulties Cueing Techniques: Verbal cues   Cognition Arousal: Alert Behavior During Therapy: WFL for tasks assessed/performed Overall Cognitive Status: Within Functional Limits for tasks assessed                                 General Comments: A&O x4, minimal slip up when completing bascin sequencing with [facility], however able to correct indpendently and appropriately     General Comments  VSS on RA    Exercises     Shoulder Instructions      Home Living Family/patient expects to be discharged to:: Private residence Living Arrangements: Spouse/significant other Available Help at Discharge: Available 24 hours/day Type of Home: House Home Access: Stairs to enter Secretary/administrator of Steps: 5 Entrance Stairs-Rails: Can reach both Home Layout: One level     Bathroom Shower/Tub: Chief Strategy Officer: Handicapped height     Home Equipment: None          Prior Functioning/Environment Prior Level of Function : Independent/Modified Independent;Working/employed;Driving             Mobility Comments: independent ADLs Comments: indepenent, works Education officer, environmental a school, still driving        OT Problem List: Decreased cognition      OT Treatment/Interventions: Cognitive remediation/compensation;Self-care/ADL training;Therapeutic activities    OT Goals(Current goals can be found in the care plan section) Acute Rehab OT Goals Patient Stated Goal: to go home OT Goal Formulation: With patient Time For Goal Achievement: 05/29/23 Potential to Achieve Goals: Good  OT Frequency: Min 1X/week    Co-evaluation              AM-PAC OT "6 Clicks"  Daily Activity     Outcome Measure Help from another person eating meals?: A Little Help from another person taking care of personal grooming?: A Little Help from another person toileting, which includes using toliet, bedpan, or urinal?: A Little Help from another person bathing (including washing, rinsing, drying)?: A Little Help from another person to put on and taking off regular upper body clothing?: A Little Help from another person to put on and taking off regular lower body clothing?: A Little 6 Click Score: 18   End of Session Nurse Communication: Mobility status  Activity Tolerance: Patient tolerated treatment well Patient left: in bed (EOB with PT)  OT Visit Diagnosis: Other symptoms and signs involving cognitive function                Time: 4098-1191 OT Time Calculation (min): 11 min Charges:  OT General Charges $OT Visit: 1 Visit OT Evaluation $OT Eval Low Complexity: 1 Low  Pollyann Glen E. Nyjai Graff, OTR/L Acute Rehabilitation Services [phone]   Cherlyn Cushing 05/15/2023, 8:37 AM

## 2023-05-15 NOTE — Progress Notes (Signed)
LTM EEG disconnected - no skin breakdown at unhook.  

## 2023-05-15 NOTE — Plan of Care (Signed)
  Problem: Education: Goal: Knowledge of General Education information will improve Description: Including pain rating scale, medication(s)/side effects and non-pharmacologic comfort measures Outcome: Progressing   Problem: Clinical Measurements: Goal: Diagnostic test results will improve Outcome: Progressing Goal: Respiratory complications will improve Outcome: Progressing Goal: Cardiovascular complication will be avoided Outcome: Progressing   Problem: Coping: Goal: Level of anxiety will decrease Outcome: Progressing   Problem: Elimination: Goal: Will not experience complications related to urinary retention Outcome: Progressing   Problem: Pain Management: Goal: General experience of comfort will improve Outcome: Progressing   Problem: Skin Integrity: Goal: Risk for impaired skin integrity will decrease Outcome: Progressing

## 2023-05-15 NOTE — Discharge Summary (Signed)
Physician Discharge Summary   Patient: [name] MRN: [id number] DOB: 02/26/1956  Admit date:     05/14/2023  Discharge date: 05/16/23  Discharge Physician: York Ram Wen[place]   PCP: Mick Sell, MD   Recommendations at discharge:    Patient will continue taking Keppra 500 mg po bid.   Rescue medication clonazepam 2 mg for seizure lasting more than 2 minutes. Intranasal Valtoco is over $ 1,000.  Will need outpatient CT chest, abdomen and pelvis, as part of seizure work up. (Rule out neoplastic etiology).  Continue aspirin and plavix for 21 days then continue aspirin alone.   Seizure precautions.  Follow up with [facility] clinic.  Follow up with Dr Sampson Goon in 7 to 10 days.   Discharge Diagnoses: Principal Problem:   Seizures (HCC) Active Problems:   CVA (cerebral vascular accident) (HCC)   Essential hypertension   Dyslipidemia  Resolved Problems:   * No resolved hospital problems. *  Hospital Course: [name] was admitted to the hospital with the working diagnosis of uncontrolled seizures.   67 y.o. male with medical history significant of hypertension, dyslipidemia, history of CVA and congenital bicuspid aortic valve who was transferred from [facility] due to seizures.    Patient was admitted to [facility] after having recurrent generalized tonic clonic seizures. First event on 11/02 during the evening hours. It occurred while he was seating in a chair. He had a brief prodromal symptom of dizziness and then developed a generalized seizure, tonic clonic event which lasted 3 to 5 minutes. He had tongue biting and experienced post ictal state.  EMS was called and he was transported to the ED in [facility]  He was evaluated in the ED, work up with head CT was negative for acute changes and then he was discharged home with instructions to follow up with Neurology.  Around midnight the same day, Nov 02, while patient has at home in bed  he developed another episode of  generalized tonic clonic seizure, lasting again 3 to 5 minutes. Again tongue biting and post ictal state.  His wife called EMS and he was taken back to the ED.   In [facility] Neurology was consulted and recommended Keppra 500 mg bid and transfer to [facility] for LTM EEG.    At the time of his transfer he was hemodynamically stable, his was awake and alert. His physical examination was unremarkable and he was neurologically intact.   Na 135. K 4.6 Cl 104 bicarbonate 23, glucose 115 bun 15 cr 1.21 Wbc 12.9 hgb 13.8 plt 218    Head CT with no acute changes.    Brain MRI with tiny acute infarct in the left occipital cortex. No other evidence of acute intracranial pathology.  A small remote infarct in the splenium of the corpus callosum is new since MRI from 01/18/2023. A few additional small remote infarcts. Scattered punctate chronic micro-hemorrhages, unchanged and nonspecific in distribution.    EKG 80 bpm, normal axis, right bundle branch block, qtc not prolonged, sinus rhythm with no significant ST segment or T wave changes.   Patient was placed on long term EEG monitoring, which was negative for active seizures.  He remained seizure free.  Decision to continue keppra 500 mg po bid and follow up as outpatient   Assessment and Plan: * Seizures (HCC) Generalized seizures.  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  LTM EEG negative for seizures.   Plan to continue Keppra  500 mg bid per neurology recommendations.  As needed rescue medication: clonazepam 2 mg for seizure lasting more than 2 minutes.  Continue seizure precautions per neurology recommendations, including no driving.  Follow up with neurology as outpatient.  Neuro checks q 4 hrs Seizure precautions.  Follow up with continuous EEG monitoring and further neurology recommendations.   CVA (cerebral vascular accident) [facility]) MRI with acute small ischemic event at the left occipital  cortex.  Plan to continue with aspirin and clopidogrel for 21 days, then continue aspirin alone.  Continue statin therapy and follow up as outpatient.  Echocardiogram with preserved LV systolic function.  EKG sinus rhythm.   Essential hypertension Continue blood pressure control with amlodipine and losartan. Patient will continue blood pressure control.  Echocardiogram with preserved LV systolic function 60 to 65% EF, mild LVH, preserved RV systolic function, with no significant valvular disease.    Dyslipidemia Continue statin therapy.   AKI (acute kidney injury) (HCC) Patient had a peak serum creatinine of 1,73, he received IV fluids with improvement down to 1,42. His discharge K is 4.0 and serum bicarbonate 23, Na 137 and Cl 105.  His peak Ck was 400 and at the time of his discharge is 233, values not consistent with rhabdomyolysis.    Consultants: neurology  Procedures performed: LTM EEG   Disposition: Home Diet recommendation:  Cardiac diet DISCHARGE MEDICATION: Allergies as of 05/16/2023   No Known Allergies      Medication List     STOP taking these medications    levETIRAcetam 500 MG 24 hr tablet Commonly known as: KEPPRA XR Replaced by: levETIRAcetam 500 MG tablet       TAKE these medications    amLODipine 10 MG tablet Commonly known as: NORVASC Take 10 mg by mouth daily.   aspirin EC 81 MG tablet Take 81 mg by mouth daily. Swallow whole.   clonazePAM 1 MG tablet Commonly known as: KLONOPIN Take 2 tablets (2 mg total) by mouth once as needed (seizure lasting more than 2 minutes.).   clopidogrel 75 MG tablet Commonly known as: PLAVIX Take 1 tablet (75 mg total) by mouth daily. Take for 21 days. What changed: additional instructions   ketorolac 0.5 % ophthalmic solution Commonly known as: ACULAR Place 1 drop into the right eye 2 (two) times daily.   levETIRAcetam 500 MG tablet Commonly known as: KEPPRA Take 1 tablet (500 mg total) by mouth 2  (two) times daily. Replaces: levETIRAcetam 500 MG 24 hr tablet   losartan 25 MG tablet Commonly known as: COZAAR Take 25 mg by mouth daily.   multivitamin with minerals tablet Take 1 tablet by mouth daily.   prednisoLONE acetate 1 % ophthalmic suspension Commonly known as: PRED FORTE Place 1 drop into the right eye 6 (six) times daily. What changed: when to take this   rosuvastatin 5 MG tablet Commonly known as: CRESTOR Take 5 mg by mouth daily.        Discharge Exam: There were no vitals filed for this visit. BP 126/77 (BP Location: Right Arm)   Pulse 73   Temp 98 F (36.7 C) (Oral)   Resp 18   Ht 5\' 11"  (1.803 m)   SpO2 99%   BMI 31.21 kg/m   Patient with no chest pain or dyspnea, no headache or neck pain.   Neurology awake and alert ENT with no pallor Cardiovascular with S1 and S2 present and regular with no gallops, rubs or murmurs Respiratory with no rales or  wheezing Abdomen with no distention  No lower extremity edema   Condition at discharge: stable  The results of significant diagnostics from this hospitalization (including imaging, microbiology, ancillary and laboratory) are listed below for reference.   Imaging Studies: ECHOCARDIOGRAM COMPLETE  Result Date: 05/15/2023    ECHOCARDIOGRAM REPORT   Patient Name:   [name] Date of Exam: 05/15/2023 Medical Rec #:  [id number]      Height:       71.0 in Accession #:    [id number]     Weight:       223.8 lb Date of Birth:  05/07/1956      BSA:          2.212 m Patient Age:    67 years       BP:           110/71 mmHg Patient Gender: M              HR:           70 bpm. Exam Location:  Inpatient Procedure: 2D Echo, Cardiac Doppler and Color Doppler Indications:    TIA G45.9  History:        Patient has prior history of Echocardiogram examinations, most                 recent 10/11/2021.  Sonographer:    Darlys Gales Referring Phys: [id number] Jameka Ivie DANIEL Amaryllis Malmquist IMPRESSIONS  1. Left ventricular ejection fraction,  by estimation, is 60 to 65%. The left ventricle has normal function. The left ventricle has no regional wall motion abnormalities. There is mild concentric left ventricular hypertrophy. Left ventricular diastolic parameters are consistent with Grade I diastolic dysfunction (impaired relaxation).  2. Right ventricular systolic function is normal. The right ventricular size is normal.  3. The mitral valve is normal in structure. No evidence of mitral valve regurgitation. No evidence of mitral stenosis.  4. The aortic valve was not well visualized. There is moderate calcification of the aortic valve. Aortic valve regurgitation is not visualized. Aortic valve sclerosis/calcification is present, without any evidence of aortic stenosis. Conclusion(s)/Recommendation(s): Limited images due to poor sound wave transmission. FINDINGS  Left Ventricle: Left ventricular ejection fraction, by estimation, is 60 to 65%. The left ventricle has normal function. The left ventricle has no regional wall motion abnormalities. The left ventricular internal cavity size was normal in size. There is  mild concentric left ventricular hypertrophy. Left ventricular diastolic parameters are consistent with Grade I diastolic dysfunction (impaired relaxation). Right Ventricle: The right ventricular size is normal. No increase in right ventricular wall thickness. Right ventricular systolic function is normal. Left Atrium: Left atrial size was normal in size. Right Atrium: Right atrial size was normal in size. Pericardium: There is no evidence of pericardial effusion. Mitral Valve: The mitral valve is normal in structure. No evidence of mitral valve regurgitation. No evidence of mitral valve stenosis. Tricuspid Valve: The tricuspid valve is normal in structure. Tricuspid valve regurgitation is not demonstrated. No evidence of tricuspid stenosis. Aortic Valve: The aortic valve was not well visualized. There is moderate calcification of the aortic  valve. Aortic valve regurgitation is not visualized. Aortic valve sclerosis/calcification is present, without any evidence of aortic stenosis. Aortic valve mean gradient measures 3.0 mmHg. Aortic valve peak gradient measures 5.3 mmHg. Aortic valve area, by VTI measures 3.27 cm. Pulmonic Valve: The pulmonic valve was not well visualized. Pulmonic valve regurgitation is not visualized. No evidence of pulmonic stenosis. Aorta: The aortic root was  not well visualized and the ascending aorta was not well visualized. Venous: The inferior vena cava was not well visualized. IAS/Shunts: No atrial level shunt detected by color flow Doppler.  LEFT VENTRICLE PLAX 2D LVIDd:         3.40 cm   Diastology LVIDs:         2.20 cm   LV e' medial:    4.68 cm/s LV PW:         1.20 cm   LV E/e' medial:  12.7 LV IVS:        1.10 cm   LV e' lateral:   6.42 cm/s LVOT diam:     1.90 cm   LV E/e' lateral: 9.3 LV SV:         58 LV SV Index:   26 LVOT Area:     2.84 cm  RIGHT VENTRICLE RV S prime:     7.40 cm/s TAPSE (M-mode): 2.0 cm LEFT ATRIUM             Index        RIGHT ATRIUM           Index LA Vol (A2C):   30.5 ml 13.79 ml/m  RA Area:     17.60 cm LA Vol (A4C):   43.8 ml 19.80 ml/m  RA Volume:   51.40 ml  23.24 ml/m LA Biplane Vol: 36.7 ml 16.59 ml/m  AORTIC VALVE AV Area (Vmax):    2.76 cm AV Area (Vmean):   3.20 cm AV Area (VTI):     3.27 cm AV Vmax:           115.00 cm/s AV Vmean:          78.300 cm/s AV VTI:            0.178 m AV Peak Grad:      5.3 mmHg AV Mean Grad:      3.0 mmHg LVOT Vmax:         112.00 cm/s LVOT Vmean:        88.400 cm/s LVOT VTI:          0.205 m LVOT/AV VTI ratio: 1.15 MITRAL VALVE MV Area (PHT): 2.37 cm    SHUNTS MV Decel Time: 320 msec    Systemic VTI:  0.20 m MV E velocity: 59.60 cm/s  Systemic Diam: 1.90 cm MV A velocity: 99.80 cm/s MV E/A ratio:  0.60 Arvilla Meres MD Electronically signed by Arvilla Meres MD Signature Date/Time: 05/15/2023/10:20:23 AM    Final    Overnight EEG with  video  Result Date: 05/15/2023 Charlsie Quest, MD     05/15/2023 12:52 PM Patient Name: [name] MRN: [id number] Epilepsy Attending: Charlsie Quest Referring Physician/Provider: Milon Dikes, MD Duration: 05/14/2023 1620 to 05/15/2023 1115 Patient history:  67 y.o. male who has a past medical history of hypertension, dyslipidemia and congenital bicuspid aortic valve status post replacement in 2023 with reported history of strokes that was seen on MRI but no clinical episodes of strokes, presented for evaluation of multiple seizures. EEG to evaluate for seizure Level of alertness: Awake, asleep AEDs during EEG study: LEV Technical aspects: This EEG study was done with scalp electrodes positioned according to the 10-20 International system of electrode placement. Electrical activity was reviewed with band pass filter of 1-70Hz , sensitivity of 7 uV/mm, display speed of 1mm/sec with a 60Hz  notched filter applied as appropriate. EEG data were recorded continuously and digitally stored.  Video monitoring was available and  reviewed as appropriate. Description: The posterior dominant rhythm consists of 8.5 Hz activity of moderate voltage (25-35 uV) seen predominantly in posterior head regions, symmetric and reactive to eye opening and eye closing. Sleep was characterized by vertex waves, sleep spindles (12 to 14 Hz), maximal frontocentral region. Hyperventilation and photic stimulation were not performed.   IMPRESSION: This study is within normal limits. No seizures or epileptiform discharges were seen throughout the recording. A normal interictal EEG does not exclude the diagnosis of epilepsy. Charlsie Quest   MR BRAIN W WO CONTRAST  Result Date: 05/14/2023 CLINICAL DATA:  Seizures. EXAM: MRI HEAD WITHOUT AND WITH CONTRAST TECHNIQUE: Multiplanar, multiecho pulse sequences of the brain and surrounding structures were obtained without and with intravenous contrast. CONTRAST:  10mL GADAVIST GADOBUTROL 1  MMOL/ML IV SOLN COMPARISON:  CT head 1 day prior, brain MRI 01/18/2023 FINDINGS: Brain: There is a punctate focus of diffusion restriction in the left occipital lobe cortex which could reflect a tiny acute infarct. There is no other evidence of acute infarct. There is no acute intracranial hemorrhage or extra-axial fluid collection. Parenchymal volume is normal. The ventricles are normal in size. Small remote infarcts in the right centrum semiovale, left frontal subcortical white matter, and right cerebellar hemisphere are unchanged. There are scattered punctate chronic microhemorrhages in the right cerebral hemisphere and left cerebellar hemisphere, unchanged and nonspecific. The corpus callosum is normally formed; however, there is a remote infarct in the splenium which is new since 01/18/2023. The hippocampi are normal in signal and architecture. There is no mass lesion or abnormal enhancement. The pituitary and suprasellar region are normal there is no mass effect or midline shift. Vascular: Normal flow voids. Skull and upper cervical spine: Normal marrow signal. Sinuses/Orbits: The paranasal sinuses are clear. A right lens implant is noted. The globes and orbits are otherwise unremarkable. Other: There is trace fluid in the right mastoid air cells, nonspecific. IMPRESSION: 1. Possible tiny acute infarct in the left occipital cortex. No other evidence of acute intracranial pathology. 2. A small remote infarct in the splenium of the corpus callosum is new since the MRI from 01/18/2023. A few additional small remote infarcts detailed above are unchanged. 3. Scattered punctate chronic microhemorrhages, unchanged and nonspecific in distribution. Electronically Signed   By: Lesia Hausen M.D.   On: 05/14/2023 12:18   CT Head Wo Contrast  Result Date: 05/13/2023 CLINICAL DATA:  Seizure EXAM: CT HEAD WITHOUT CONTRAST TECHNIQUE: Contiguous axial images were obtained from the base of the skull through the vertex  without intravenous contrast. RADIATION DOSE REDUCTION: This exam was performed according to the departmental dose-optimization program which includes automated exposure control, adjustment of the mA and/or kV according to patient size and/or use of iterative reconstruction technique. COMPARISON:  10/11/2021 FINDINGS: Brain: No evidence of acute infarction, hemorrhage, hydrocephalus, extra-axial collection or mass lesion/mass effect. Vascular: No hyperdense vessel or unexpected calcification. Skull: Normal. Negative for fracture or focal lesion. Sinuses/Orbits: The visualized paranasal sinuses are essentially clear. The mastoid air cells are unopacified. Other: None. IMPRESSION: Normal head CT. Electronically Signed   By: Charline Bills M.D.   On: 05/13/2023 20:14    Microbiology: Results for orders placed or performed during the hospital encounter of 10/11/21  CULTURE, BLOOD (ROUTINE X 2) w Reflex to ID Panel     Status: None   Collection Time: 10/12/21  5:07 PM   Specimen: BLOOD  Result Value Ref Range Status   Specimen Description BLOOD RIGHT ASSIST CONTROL  Final   Special Requests   Final    BOTTLES DRAWN AEROBIC AND ANAEROBIC Blood Culture results may not be optimal due to an excessive volume of blood received in culture bottles   Culture   Final    NO GROWTH 5 DAYS Performed at [facility], [address] [place], [place] [address]    Report Status 10/17/2021 FINAL  Final  CULTURE, BLOOD (ROUTINE X 2) w Reflex to ID Panel     Status: None   Collection Time: 10/12/21  5:08 PM   Specimen: BLOOD  Result Value Ref Range Status   Specimen Description BLOOD LEFT ASSIST CONTROL  Final   Special Requests   Final    BOTTLES DRAWN AEROBIC AND ANAEROBIC Blood Culture adequate volume   Culture   Final    NO GROWTH 5 DAYS Performed at [facility], [address] [place], [place] [address]    Report Status 10/17/2021 FINAL  Final    Labs: CBC: Recent Labs  Lab  05/13/23 1900 05/14/23 0513 05/15/23 0621  WBC 6.5 12.9* 7.2  NEUTROABS 3.5  --   --   HGB 13.6 13.8 13.7  HCT 40.1 40.3 41.2  MCV 90.3 89.6 90.0  PLT 234 218 204   Basic Metabolic Panel: Recent Labs  Lab 05/13/23 1900 05/14/23 0513 05/15/23 0621  NA 136 135 136  K 4.0 4.6 4.3  CL 103 104 103  CO2 24 23 24   GLUCOSE 100* 115* 106*  BUN 19 15 17   CREATININE 1.40* 1.21 1.65*  CALCIUM 9.2 9.7 9.1   Liver Function Tests: Recent Labs  Lab 05/13/23 1900  AST 36  ALT 28  ALKPHOS 52  BILITOT 0.7  PROT 8.1  ALBUMIN 4.4   CBG: Recent Labs  Lab 05/13/23 1942 05/14/23 0132  GLUCAP 85 107*    Discharge time spent: greater than 30 minutes.  Signed: Coralie Keens, MD [facility] 05/15/2023

## 2023-05-15 NOTE — TOC Benefit Eligibility Note (Signed)
Patient Product/process development scientist completed.    The patient is insured through [employer]/ [employer]. Patient has Medicare and is not eligible for a copay card, but may be able to apply for patient assistance, if available.    Ran test claim for Valtoco 20 mg and the current 30 day co-pay is $1,102.78.   This test claim was processed through [facility]- copay amounts may vary at other pharmacies due to pharmacy/plan contracts, or as the patient moves through the different stages of their insurance plan.     Roland Earl, CPHT Pharmacy Technician III Certified Patient Advocate [facility] Patient Advocate Team Direct Number: [phone]  Fax: [phone]

## 2023-05-15 NOTE — Progress Notes (Signed)
At 1954, BMP results out, patient is for discharge post result review, informed Dr. Joneen Roach that there is no active order for discharge yet.  At 2004 discharge ordered. IV removed, telemetry discontinued. AVS provided to wife.   2031, wife complaining about medication prescription being sent to [facility] pharmacy and not in [facility], informed Dr. Joneen Roach.  At 2050, Dr. Ella Jubilee deferred discharge and ordered IV fluid to be started, explained to wife about new treatment plan and stated understanding. Dr. Joneen Roach updated.

## 2023-05-15 NOTE — Evaluation (Signed)
Physical Therapy Evaluation Patient Details Name: [name] MRN: [id number] DOB: 1956/05/03 Today's Date: 05/15/2023  History of Present Illness  The pt is a 67 yo male presenting 11/2 after a seizure witnessed by family at home, imaging clear, and pt d/c home. He returned later that night after a second seizure, transferred to [facility] 11/3 for LTM EEG. 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   Clinical Impression  Pt in bed upon arrival of PT, agreeable to evaluation at this time. Prior to admission the pt was independent with all mobility, attending a gym regularly, and working as a Copy at an [employer]. The pt was able to demo good mobility this session, limited only by continuous EEG cords/wires. He was able to complete all sit-stand transfers without assistance, as well as short distance ambulation and balance testing in the room without need for DME or assistance. Pt has support PRN at home, is safe from a mobility standpoint to return home without follow up therapies. Will continue to follow acutely to progress mobility as able.      If plan is discharge home, recommend the following: Assist for transportation   Can travel by private vehicle        Equipment Recommendations None recommended by PT  Recommendations for Other Services       Functional Status Assessment Patient has had a recent decline in their functional status and demonstrates the ability to make significant improvements in function in a reasonable and predictable amount of time.     Precautions / Restrictions Precautions Precautions: Fall;Other (comment) Precaution Comments: Seizure precuations Restrictions Weight Bearing Restrictions: No      Mobility  Bed Mobility Overal bed mobility: Needs Assistance Bed Mobility: Supine to Sit     Supine to sit: Supervision     General bed mobility comments: supervision for safety     Transfers Overall transfer level: Needs assistance Equipment used: None Transfers: Sit to/from Stand Sit to Stand: Supervision           General transfer comment: supervision for safety, no overt LOB or need for assist    Ambulation/Gait Ambulation/Gait assistance: Supervision Gait Distance (Feet): 45 Feet Assistive device: None Gait Pattern/deviations: WFL(Within Functional Limits) Gait velocity: WFL     General Gait Details: pt reports at baseline limited by continuous EEG     Balance Overall balance assessment: Mild deficits observed, not formally tested         Standing balance support: No upper extremity supported, During functional activity Standing balance-Leahy Scale: Good   Single Leg Stance - Right Leg: 4 Single Leg Stance - Left Leg: 3 Tandem Stance - Right Leg: 8 Tandem Stance - Left Leg: 6 Rhomberg - Eyes Opened: 15 Rhomberg - Eyes Closed: 15                 Pertinent Vitals/Pain      Home Living Family/patient expects to be discharged to:: Private residence Living Arrangements: Spouse/significant other Available Help at Discharge: Available 24 hours/day Type of Home: House Home Access: Stairs to enter Entrance Stairs-Rails: Can reach both Entrance Stairs-Number of Steps: 5   Home Layout: One level Home Equipment: None      Prior Function Prior Level of Function : Independent/Modified Independent;Working/employed;Driving             Mobility Comments: independent ADLs Comments: indepenent, works Education officer, environmental a school, still driving     Extremity/Trunk Assessment  Upper Extremity Assessment Upper Extremity Assessment: Defer to OT evaluation    Lower Extremity Assessment Lower Extremity Assessment: Overall WFL for tasks assessed    Cervical / Trunk Assessment Cervical / Trunk Assessment: Normal  Communication   Communication Communication: No apparent difficulties Cueing Techniques: Verbal cues  Cognition Arousal:  Alert Behavior During Therapy: WFL for tasks assessed/performed Overall Cognitive Status: Within Functional Limits for tasks assessed                                 General Comments: A&O x4, minimal slip up when completing bascin sequencing with [facility], however able to correct indpendently and appropriately        General Comments General comments (skin integrity, edema, etc.): VSS, on continuouse EEG at time of evaluation. pt reports he is at baseline    Exercises     Assessment/Plan    PT Assessment Patient needs continued PT services  PT Problem List Decreased activity tolerance;Decreased balance;Decreased mobility       PT Treatment Interventions Stair training;Functional mobility training;Therapeutic activities;Therapeutic exercise;Balance training    PT Goals (Current goals can be found in the Care Plan section)  Acute Rehab PT Goals Patient Stated Goal: return to work PT Goal Formulation: With patient/family Time For Goal Achievement: 05/29/23 Potential to Achieve Goals: Good    Frequency Min 1X/week        AM-PAC PT "6 Clicks" Mobility  Outcome Measure Help needed turning from your back to your side while in a flat bed without using bedrails?: None Help needed moving from lying on your back to sitting on the side of a flat bed without using bedrails?: None Help needed moving to and from a bed to a chair (including a wheelchair)?: A Little Help needed standing up from a chair using your arms (e.g., wheelchair or bedside chair)?: A Little Help needed to walk in hospital room?: A Little Help needed climbing 3-5 steps with a railing? : A Little 6 Click Score: 20    End of Session Equipment Utilized During Treatment: Other (comment) (continuouse EEG) Activity Tolerance: Patient tolerated treatment well Patient left: in bed;with call bell/phone within reach;with family/visitor present Nurse Communication: Mobility status PT Visit Diagnosis: Other  abnormalities of gait and mobility (R26.89);Unsteadiness on feet (R26.81)    Time: 0981-1914 PT Time Calculation (min) (ACUTE ONLY): 14 min   Charges:   PT Evaluation $PT Eval Low Complexity: 1 Low   PT General Charges $$ ACUTE PT VISIT: 1 Visit         Vickki Muff, PT, DPT   Acute Rehabilitation Department Office [phone] Secure Chat Communication Preferred  Ronnie Derby 05/15/2023, 9:57 AM

## 2023-05-15 NOTE — TOC Initial Note (Signed)
Transition of Care [facility]) - Initial/Assessment Note    Patient Details  Name: [name] MRN: [id number] Date of Birth: Jul 09, 1956  [facility]) CM/SW Contact:    Kermit Balo, RN Phone Number: 05/15/2023, 11:15 AM  Clinical Narrative:                  Pt is from home with his spouse that is with him most of the time.  No DME at home.  Pt manages his own medications and denies any issues.  Both pt and spouse drives so his wife can provide any needed transportation. No f/u per PT/OT.  TOC following.  Expected Discharge Plan: Home/Self Care Barriers to Discharge: Continued Medical Work up   Patient Goals and CMS Choice            Expected Discharge Plan and Services       Living arrangements for the past 2 months: Single Family Home                                      Prior Living Arrangements/Services Living arrangements for the past 2 months: Single Family Home Lives with:: Spouse Patient language and need for interpreter reviewed:: Yes Do you feel safe going back to the place where you live?: Yes        Care giver support system in place?: Yes (comment)   Criminal Activity/Legal Involvement Pertinent to Current Situation/Hospitalization: No - Comment as needed  Activities of Daily Living   ADL Screening (condition at time of admission) Independently performs ADLs?: Yes (appropriate for developmental age) Is the patient deaf or have difficulty hearing?: No Does the patient have difficulty seeing, even when wearing glasses/contacts?: No Does the patient have difficulty concentrating, remembering, or making decisions?: No  Permission Sought/Granted                  Emotional Assessment Appearance:: Appears stated age Attitude/Demeanor/Rapport: Engaged Affect (typically observed): Accepting Orientation: : Oriented to Self, Oriented to Place, Oriented to Situation, Oriented to  Time   Psych Involvement: No  (comment)  Admission diagnosis:  Seizures (HCC) [R56.9] Patient Active Problem List   Diagnosis Date Noted   Seizure (HCC) 05/14/2023   Essential hypertension 05/14/2023   History of CVA (cerebrovascular accident) 05/14/2023   Dyslipidemia 05/14/2023   Seizures (HCC) 05/14/2023   CVA (cerebral vascular accident) (HCC) 10/12/2021   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 [place] [address] Phone: [phone] Fax: [phone]     Social Determinants of Health (SDOH) Social History: SDOH Screenings   Food Insecurity: No Food Insecurity (05/14/2023)  Housing: Low Risk  (05/14/2023)  Transportation Needs: No Transportation Needs (05/14/2023)  Utilities: Not At Risk (05/14/2023)  Depression (PHQ2-9): Low Risk  (04/15/2022)  Financial Resource Strain: Low Risk  (03/06/2023)   Received from [facility]  Tobacco Use: Medium Risk (05/13/2023)   SDOH Interventions:     Readmission Risk Interventions     No data to display

## 2023-05-16 ENCOUNTER — Other Ambulatory Visit (HOSPITAL_COMMUNITY): Payer: Self-pay

## 2023-05-16 DIAGNOSIS — N179 Acute kidney failure, unspecified: Secondary | ICD-10-CM

## 2023-05-16 LAB — BASIC METABOLIC PANEL
Anion gap: 9 (ref 5–15)
BUN: 20 mg/dL (ref 8–23)
CO2: 23 mmol/L (ref 22–32)
Calcium: 8.8 mg/dL — ABNORMAL LOW (ref 8.9–10.3)
Chloride: 105 mmol/L (ref 98–111)
Creatinine, Ser: 1.43 mg/dL — ABNORMAL HIGH (ref 0.61–1.24)
GFR, Estimated: 54 mL/min — ABNORMAL LOW (ref >60)
Glucose, Bld: 107 mg/dL — ABNORMAL HIGH (ref 70–99)
Potassium: 4 mmol/L (ref 3.5–5.1)
Sodium: 137 mmol/L (ref 135–145)

## 2023-05-16 LAB — CK: Total CK: 233 U/L (ref 49–397)

## 2023-05-16 MED ORDER — CLONAZEPAM 1 MG PO TABS: 2.0000 mg | ORAL_TABLET | Freq: Once | ORAL | 0 refills | Status: AC | PRN

## 2023-05-16 MED ORDER — LEVETIRACETAM 500 MG PO TABS: 500.0000 mg | ORAL_TABLET | Freq: Two times a day (BID) | ORAL | 0 refills | Status: AC

## 2023-05-16 NOTE — TOC Transition Note (Signed)
Transition of Care [facility]) - CM/SW Discharge Note   Patient Details  Name: [name] MRN: [id number] Date of Birth: Apr 07, 1956  [facility]) CM/SW Contact:  Kermit Balo, RN Phone Number: 05/16/2023, 8:48 AM   Clinical Narrative:     Pt is discharging home with self care. No f/u per PT/OT.  Pt has transportation home.  Final next level of care: Home/Self Care Barriers to Discharge: No Barriers Identified   Patient Goals and CMS Choice      Discharge Placement                         Discharge Plan and Services Additional resources added to the After Visit Summary for                                       Social Determinants of Health (SDOH) Interventions SDOH Screenings   Food Insecurity: No Food Insecurity (05/14/2023)  Housing: Low Risk  (05/14/2023)  Transportation Needs: No Transportation Needs (05/14/2023)  Utilities: Not At Risk (05/14/2023)  Depression (PHQ2-9): Low Risk  (04/15/2022)  Financial Resource Strain: Low Risk  (03/06/2023)   Received from [facility]  Tobacco Use: Medium Risk (05/13/2023)     Readmission Risk Interventions     No data to display

## 2023-05-16 NOTE — Progress Notes (Signed)
Discharge was delayed due to elevated serum cr, this morning renal function has improved after administration of IV fluids.  Patient has remained seizure free.  BP 117/62 (BP Location: Left Arm)   Pulse 67   Temp 98.4 F (36.9 C) (Oral)   Resp 20   Ht 5\' 11"  (1.803 m)   SpO2 98%   BMI 31.21 kg/m   Neurology awake and alert Not in distress.  Neurologically non focal.   Plan to discharge home, he has [facility].

## 2023-05-16 NOTE — Progress Notes (Signed)
Reviewed DC paperwork with the pt and his wife. Asked if any questions or concerns were present. Pt states complete understanding. Pt wheeled off unit by volunteers.

## 2023-05-16 NOTE — Assessment & Plan Note (Signed)
Patient had a peak serum creatinine of 1,73, he received IV fluids with improvement down to 1,42. His discharge K is 4.0 and serum bicarbonate 23, Na 137 and Cl 105.  His peak Ck was 400 and at the time of his discharge is 233, values not consistent with rhabdomyolysis.

## 2023-05-22 DIAGNOSIS — R109 Unspecified abdominal pain: Secondary | ICD-10-CM | POA: Diagnosis not present

## 2023-05-22 DIAGNOSIS — N1831 Chronic kidney disease, stage 3a: Secondary | ICD-10-CM | POA: Diagnosis not present

## 2023-05-22 DIAGNOSIS — E785 Hyperlipidemia, unspecified: Secondary | ICD-10-CM | POA: Diagnosis not present

## 2023-05-22 DIAGNOSIS — I10 Essential (primary) hypertension: Secondary | ICD-10-CM | POA: Diagnosis not present

## 2023-05-22 DIAGNOSIS — R42 Dizziness and giddiness: Secondary | ICD-10-CM | POA: Diagnosis not present

## 2023-05-22 DIAGNOSIS — Z952 Presence of prosthetic heart valve: Secondary | ICD-10-CM | POA: Diagnosis not present

## 2023-05-22 DIAGNOSIS — I6381 Other cerebral infarction due to occlusion or stenosis of small artery: Secondary | ICD-10-CM | POA: Diagnosis not present

## 2023-05-22 DIAGNOSIS — Z125 Encounter for screening for malignant neoplasm of prostate: Secondary | ICD-10-CM | POA: Diagnosis not present

## 2023-05-22 DIAGNOSIS — Z122 Encounter for screening for malignant neoplasm of respiratory organs: Secondary | ICD-10-CM | POA: Diagnosis not present

## 2023-05-22 DIAGNOSIS — R7303 Prediabetes: Secondary | ICD-10-CM | POA: Diagnosis not present

## 2023-05-23 ENCOUNTER — Other Ambulatory Visit: Payer: Self-pay | Admitting: Infectious Diseases

## 2023-05-23 DIAGNOSIS — R569 Unspecified convulsions: Secondary | ICD-10-CM | POA: Diagnosis not present

## 2023-05-23 DIAGNOSIS — R42 Dizziness and giddiness: Secondary | ICD-10-CM | POA: Diagnosis not present

## 2023-05-23 DIAGNOSIS — R2689 Other abnormalities of gait and mobility: Secondary | ICD-10-CM | POA: Diagnosis not present

## 2023-05-23 DIAGNOSIS — E785 Hyperlipidemia, unspecified: Secondary | ICD-10-CM

## 2023-05-23 DIAGNOSIS — Z8673 Personal history of transient ischemic attack (TIA), and cerebral infarction without residual deficits: Secondary | ICD-10-CM | POA: Diagnosis not present

## 2023-05-23 DIAGNOSIS — R109 Unspecified abdominal pain: Secondary | ICD-10-CM

## 2023-05-24 ENCOUNTER — Encounter: Payer: Self-pay | Admitting: Emergency Medicine

## 2023-05-24 ENCOUNTER — Other Ambulatory Visit: Payer: Self-pay | Admitting: Emergency Medicine

## 2023-05-29 ENCOUNTER — Ambulatory Visit
Admission: RE | Admit: 2023-05-29 | Discharge: 2023-05-29 | Disposition: A | Discharge status: Home / Self Care | Payer: PPO | Source: Ambulatory Visit | Attending: Infectious Diseases | Admitting: Infectious Diseases

## 2023-05-29 DIAGNOSIS — R634 Abnormal weight loss: Secondary | ICD-10-CM | POA: Diagnosis not present

## 2023-05-29 DIAGNOSIS — E785 Hyperlipidemia, unspecified: Secondary | ICD-10-CM | POA: Diagnosis not present

## 2023-05-29 DIAGNOSIS — R109 Unspecified abdominal pain: Secondary | ICD-10-CM | POA: Insufficient documentation

## 2023-05-29 DIAGNOSIS — K439 Ventral hernia without obstruction or gangrene: Secondary | ICD-10-CM | POA: Diagnosis not present

## 2023-05-29 DIAGNOSIS — K5730 Diverticulosis of large intestine without perforation or abscess without bleeding: Secondary | ICD-10-CM | POA: Diagnosis not present

## 2023-05-29 MED ORDER — IOHEXOL 300 MG/ML  SOLN
85.0000 mL | Freq: Once | INTRAMUSCULAR | Status: AC | PRN
  Administered 2023-05-29: 85 mL via INTRAVENOUS

## 2023-06-20 DIAGNOSIS — J449 Chronic obstructive pulmonary disease, unspecified: Secondary | ICD-10-CM | POA: Diagnosis not present

## 2023-06-20 DIAGNOSIS — E785 Hyperlipidemia, unspecified: Secondary | ICD-10-CM | POA: Diagnosis not present

## 2023-06-20 DIAGNOSIS — K219 Gastro-esophageal reflux disease without esophagitis: Secondary | ICD-10-CM | POA: Diagnosis not present

## 2023-06-20 DIAGNOSIS — R7303 Prediabetes: Secondary | ICD-10-CM | POA: Diagnosis not present

## 2023-06-20 DIAGNOSIS — N1831 Chronic kidney disease, stage 3a: Secondary | ICD-10-CM | POA: Diagnosis not present

## 2023-06-20 DIAGNOSIS — I350 Nonrheumatic aortic (valve) stenosis: Secondary | ICD-10-CM | POA: Diagnosis not present

## 2023-06-20 DIAGNOSIS — Z952 Presence of prosthetic heart valve: Secondary | ICD-10-CM | POA: Diagnosis not present

## 2023-06-20 DIAGNOSIS — Z122 Encounter for screening for malignant neoplasm of respiratory organs: Secondary | ICD-10-CM | POA: Diagnosis not present

## 2023-06-20 DIAGNOSIS — I10 Essential (primary) hypertension: Secondary | ICD-10-CM | POA: Diagnosis not present

## 2023-07-18 DIAGNOSIS — I6381 Other cerebral infarction due to occlusion or stenosis of small artery: Secondary | ICD-10-CM | POA: Diagnosis not present

## 2023-07-18 DIAGNOSIS — R7303 Prediabetes: Secondary | ICD-10-CM | POA: Diagnosis not present

## 2023-07-18 DIAGNOSIS — Z952 Presence of prosthetic heart valve: Secondary | ICD-10-CM | POA: Diagnosis not present

## 2023-07-18 DIAGNOSIS — R42 Dizziness and giddiness: Secondary | ICD-10-CM | POA: Diagnosis not present

## 2023-07-18 DIAGNOSIS — I350 Nonrheumatic aortic (valve) stenosis: Secondary | ICD-10-CM | POA: Diagnosis not present

## 2023-07-18 DIAGNOSIS — Z125 Encounter for screening for malignant neoplasm of prostate: Secondary | ICD-10-CM | POA: Diagnosis not present

## 2023-07-18 DIAGNOSIS — I10 Essential (primary) hypertension: Secondary | ICD-10-CM | POA: Diagnosis not present

## 2023-07-18 DIAGNOSIS — E785 Hyperlipidemia, unspecified: Secondary | ICD-10-CM | POA: Diagnosis not present

## 2023-07-25 DIAGNOSIS — Z0000 Encounter for general adult medical examination without abnormal findings: Secondary | ICD-10-CM | POA: Diagnosis not present

## 2023-07-25 DIAGNOSIS — K219 Gastro-esophageal reflux disease without esophagitis: Secondary | ICD-10-CM | POA: Diagnosis not present

## 2023-07-25 DIAGNOSIS — Z952 Presence of prosthetic heart valve: Secondary | ICD-10-CM | POA: Diagnosis not present

## 2023-07-25 DIAGNOSIS — R7303 Prediabetes: Secondary | ICD-10-CM | POA: Diagnosis not present

## 2023-07-25 DIAGNOSIS — J449 Chronic obstructive pulmonary disease, unspecified: Secondary | ICD-10-CM | POA: Diagnosis not present

## 2023-07-25 DIAGNOSIS — I4891 Unspecified atrial fibrillation: Secondary | ICD-10-CM | POA: Diagnosis not present

## 2023-07-25 DIAGNOSIS — I10 Essential (primary) hypertension: Secondary | ICD-10-CM | POA: Diagnosis not present

## 2023-07-25 DIAGNOSIS — N1831 Chronic kidney disease, stage 3a: Secondary | ICD-10-CM | POA: Diagnosis not present

## 2023-07-25 DIAGNOSIS — I350 Nonrheumatic aortic (valve) stenosis: Secondary | ICD-10-CM | POA: Diagnosis not present

## 2023-07-25 DIAGNOSIS — I9789 Other postprocedural complications and disorders of the circulatory system, not elsewhere classified: Secondary | ICD-10-CM | POA: Diagnosis not present

## 2023-07-25 DIAGNOSIS — R569 Unspecified convulsions: Secondary | ICD-10-CM | POA: Diagnosis not present

## 2023-07-25 DIAGNOSIS — E785 Hyperlipidemia, unspecified: Secondary | ICD-10-CM | POA: Diagnosis not present

## 2023-07-25 DIAGNOSIS — R21 Rash and other nonspecific skin eruption: Secondary | ICD-10-CM | POA: Diagnosis not present

## 2023-07-31 DIAGNOSIS — Z8673 Personal history of transient ischemic attack (TIA), and cerebral infarction without residual deficits: Secondary | ICD-10-CM | POA: Diagnosis not present

## 2023-07-31 DIAGNOSIS — R2689 Other abnormalities of gait and mobility: Secondary | ICD-10-CM | POA: Diagnosis not present

## 2023-07-31 DIAGNOSIS — R569 Unspecified convulsions: Secondary | ICD-10-CM | POA: Diagnosis not present

## 2023-07-31 DIAGNOSIS — R42 Dizziness and giddiness: Secondary | ICD-10-CM | POA: Diagnosis not present

## 2023-08-04 DIAGNOSIS — Z95828 Presence of other vascular implants and grafts: Secondary | ICD-10-CM | POA: Diagnosis not present

## 2023-08-04 DIAGNOSIS — I350 Nonrheumatic aortic (valve) stenosis: Secondary | ICD-10-CM | POA: Diagnosis not present

## 2023-08-04 DIAGNOSIS — R569 Unspecified convulsions: Secondary | ICD-10-CM | POA: Diagnosis not present

## 2023-08-04 DIAGNOSIS — I351 Nonrheumatic aortic (valve) insufficiency: Secondary | ICD-10-CM | POA: Diagnosis not present

## 2023-08-04 DIAGNOSIS — I6381 Other cerebral infarction due to occlusion or stenosis of small artery: Secondary | ICD-10-CM | POA: Diagnosis not present

## 2023-08-04 DIAGNOSIS — E785 Hyperlipidemia, unspecified: Secondary | ICD-10-CM | POA: Diagnosis not present

## 2023-08-04 DIAGNOSIS — Z952 Presence of prosthetic heart valve: Secondary | ICD-10-CM | POA: Diagnosis not present

## 2023-08-04 DIAGNOSIS — F1490 Cocaine use, unspecified, uncomplicated: Secondary | ICD-10-CM | POA: Diagnosis not present

## 2023-08-04 DIAGNOSIS — Z720 Tobacco use: Secondary | ICD-10-CM | POA: Diagnosis not present

## 2023-08-04 DIAGNOSIS — I10 Essential (primary) hypertension: Secondary | ICD-10-CM | POA: Diagnosis not present

## 2023-08-04 DIAGNOSIS — R002 Palpitations: Secondary | ICD-10-CM | POA: Diagnosis not present

## 2023-08-14 DIAGNOSIS — Z122 Encounter for screening for malignant neoplasm of respiratory organs: Secondary | ICD-10-CM | POA: Diagnosis not present

## 2023-08-14 DIAGNOSIS — Z952 Presence of prosthetic heart valve: Secondary | ICD-10-CM | POA: Diagnosis not present

## 2023-08-14 DIAGNOSIS — K219 Gastro-esophageal reflux disease without esophagitis: Secondary | ICD-10-CM | POA: Diagnosis not present

## 2023-08-14 DIAGNOSIS — R7303 Prediabetes: Secondary | ICD-10-CM | POA: Diagnosis not present

## 2023-08-14 DIAGNOSIS — N1831 Chronic kidney disease, stage 3a: Secondary | ICD-10-CM | POA: Diagnosis not present

## 2023-08-14 DIAGNOSIS — I10 Essential (primary) hypertension: Secondary | ICD-10-CM | POA: Diagnosis not present

## 2023-08-14 DIAGNOSIS — E785 Hyperlipidemia, unspecified: Secondary | ICD-10-CM | POA: Diagnosis not present

## 2023-08-14 DIAGNOSIS — J449 Chronic obstructive pulmonary disease, unspecified: Secondary | ICD-10-CM | POA: Diagnosis not present

## 2023-08-14 DIAGNOSIS — I350 Nonrheumatic aortic (valve) stenosis: Secondary | ICD-10-CM | POA: Diagnosis not present

## 2023-08-17 DIAGNOSIS — H4389 Other disorders of vitreous body: Secondary | ICD-10-CM | POA: Diagnosis not present

## 2023-08-17 DIAGNOSIS — H4419 Other endophthalmitis: Secondary | ICD-10-CM | POA: Diagnosis not present

## 2023-08-17 DIAGNOSIS — H3581 Retinal edema: Secondary | ICD-10-CM | POA: Diagnosis not present

## 2023-08-17 DIAGNOSIS — H44431 Hypotony of eye due to other ocular disorders, right eye: Secondary | ICD-10-CM | POA: Diagnosis not present

## 2023-08-17 DIAGNOSIS — B49 Unspecified mycosis: Secondary | ICD-10-CM | POA: Diagnosis not present

## 2023-08-17 DIAGNOSIS — H35351 Cystoid macular degeneration, right eye: Secondary | ICD-10-CM | POA: Diagnosis not present

## 2023-08-22 DIAGNOSIS — M79675 Pain in left toe(s): Secondary | ICD-10-CM | POA: Diagnosis not present

## 2023-08-22 DIAGNOSIS — M79674 Pain in right toe(s): Secondary | ICD-10-CM | POA: Diagnosis not present

## 2023-08-22 DIAGNOSIS — B351 Tinea unguium: Secondary | ICD-10-CM | POA: Diagnosis not present

## 2023-08-24 DIAGNOSIS — R569 Unspecified convulsions: Secondary | ICD-10-CM | POA: Diagnosis not present

## 2023-09-07 DIAGNOSIS — R569 Unspecified convulsions: Secondary | ICD-10-CM | POA: Diagnosis not present

## 2023-09-22 DIAGNOSIS — R569 Unspecified convulsions: Secondary | ICD-10-CM | POA: Diagnosis not present

## 2023-09-23 DIAGNOSIS — R569 Unspecified convulsions: Secondary | ICD-10-CM | POA: Diagnosis not present

## 2023-09-24 DIAGNOSIS — R569 Unspecified convulsions: Secondary | ICD-10-CM | POA: Diagnosis not present

## 2023-11-14 DIAGNOSIS — K219 Gastro-esophageal reflux disease without esophagitis: Secondary | ICD-10-CM | POA: Diagnosis not present

## 2023-11-14 DIAGNOSIS — I1310 Hypertensive heart and chronic kidney disease without heart failure, with stage 1 through stage 4 chronic kidney disease, or unspecified chronic kidney disease: Secondary | ICD-10-CM | POA: Diagnosis not present

## 2023-11-14 DIAGNOSIS — R7303 Prediabetes: Secondary | ICD-10-CM | POA: Diagnosis not present

## 2023-11-14 DIAGNOSIS — I350 Nonrheumatic aortic (valve) stenosis: Secondary | ICD-10-CM | POA: Diagnosis not present

## 2023-11-14 DIAGNOSIS — I779 Disorder of arteries and arterioles, unspecified: Secondary | ICD-10-CM | POA: Diagnosis not present

## 2023-11-14 DIAGNOSIS — E785 Hyperlipidemia, unspecified: Secondary | ICD-10-CM | POA: Diagnosis not present

## 2023-11-14 DIAGNOSIS — R569 Unspecified convulsions: Secondary | ICD-10-CM | POA: Diagnosis not present

## 2023-11-14 DIAGNOSIS — I9789 Other postprocedural complications and disorders of the circulatory system, not elsewhere classified: Secondary | ICD-10-CM | POA: Diagnosis not present

## 2023-11-14 DIAGNOSIS — Z952 Presence of prosthetic heart valve: Secondary | ICD-10-CM | POA: Diagnosis not present

## 2023-11-14 DIAGNOSIS — R21 Rash and other nonspecific skin eruption: Secondary | ICD-10-CM | POA: Diagnosis not present

## 2023-11-14 DIAGNOSIS — I10 Essential (primary) hypertension: Secondary | ICD-10-CM | POA: Diagnosis not present

## 2023-11-14 DIAGNOSIS — I4891 Unspecified atrial fibrillation: Secondary | ICD-10-CM | POA: Diagnosis not present

## 2023-11-14 DIAGNOSIS — Z95828 Presence of other vascular implants and grafts: Secondary | ICD-10-CM | POA: Diagnosis not present

## 2023-11-14 DIAGNOSIS — J449 Chronic obstructive pulmonary disease, unspecified: Secondary | ICD-10-CM | POA: Diagnosis not present

## 2023-11-14 DIAGNOSIS — Z87891 Personal history of nicotine dependence: Secondary | ICD-10-CM | POA: Diagnosis not present

## 2023-11-14 DIAGNOSIS — Z48812 Encounter for surgical aftercare following surgery on the circulatory system: Secondary | ICD-10-CM | POA: Diagnosis not present

## 2023-11-14 DIAGNOSIS — N1831 Chronic kidney disease, stage 3a: Secondary | ICD-10-CM | POA: Diagnosis not present

## 2023-11-22 DIAGNOSIS — E785 Hyperlipidemia, unspecified: Secondary | ICD-10-CM | POA: Diagnosis not present

## 2023-11-22 DIAGNOSIS — R7303 Prediabetes: Secondary | ICD-10-CM | POA: Diagnosis not present

## 2023-11-22 DIAGNOSIS — I4891 Unspecified atrial fibrillation: Secondary | ICD-10-CM | POA: Diagnosis not present

## 2023-11-22 DIAGNOSIS — Z952 Presence of prosthetic heart valve: Secondary | ICD-10-CM | POA: Diagnosis not present

## 2023-11-22 DIAGNOSIS — Z95828 Presence of other vascular implants and grafts: Secondary | ICD-10-CM | POA: Diagnosis not present

## 2023-11-22 DIAGNOSIS — I10 Essential (primary) hypertension: Secondary | ICD-10-CM | POA: Diagnosis not present

## 2023-11-22 DIAGNOSIS — R569 Unspecified convulsions: Secondary | ICD-10-CM | POA: Diagnosis not present

## 2023-11-22 DIAGNOSIS — I9789 Other postprocedural complications and disorders of the circulatory system, not elsewhere classified: Secondary | ICD-10-CM | POA: Diagnosis not present

## 2023-11-27 DIAGNOSIS — R42 Dizziness and giddiness: Secondary | ICD-10-CM | POA: Diagnosis not present

## 2023-11-27 DIAGNOSIS — H6123 Impacted cerumen, bilateral: Secondary | ICD-10-CM | POA: Diagnosis not present

## 2023-11-27 DIAGNOSIS — H6063 Unspecified chronic otitis externa, bilateral: Secondary | ICD-10-CM | POA: Diagnosis not present

## 2023-11-30 DIAGNOSIS — R2689 Other abnormalities of gait and mobility: Secondary | ICD-10-CM | POA: Diagnosis not present

## 2023-11-30 DIAGNOSIS — R42 Dizziness and giddiness: Secondary | ICD-10-CM | POA: Diagnosis not present

## 2023-11-30 DIAGNOSIS — R569 Unspecified convulsions: Secondary | ICD-10-CM | POA: Diagnosis not present

## 2023-11-30 DIAGNOSIS — Z8673 Personal history of transient ischemic attack (TIA), and cerebral infarction without residual deficits: Secondary | ICD-10-CM | POA: Diagnosis not present

## 2023-12-09 DIAGNOSIS — F4489 Other dissociative and conversion disorders: Secondary | ICD-10-CM | POA: Diagnosis not present

## 2023-12-09 DIAGNOSIS — Z8673 Personal history of transient ischemic attack (TIA), and cerebral infarction without residual deficits: Secondary | ICD-10-CM | POA: Diagnosis not present

## 2023-12-09 DIAGNOSIS — Z87891 Personal history of nicotine dependence: Secondary | ICD-10-CM | POA: Diagnosis not present

## 2023-12-09 DIAGNOSIS — R410 Disorientation, unspecified: Secondary | ICD-10-CM | POA: Diagnosis not present

## 2023-12-09 DIAGNOSIS — E785 Hyperlipidemia, unspecified: Secondary | ICD-10-CM | POA: Diagnosis not present

## 2023-12-09 DIAGNOSIS — G454 Transient global amnesia: Secondary | ICD-10-CM | POA: Diagnosis not present

## 2023-12-09 DIAGNOSIS — I10 Essential (primary) hypertension: Secondary | ICD-10-CM | POA: Diagnosis not present

## 2023-12-10 DIAGNOSIS — Z8673 Personal history of transient ischemic attack (TIA), and cerebral infarction without residual deficits: Secondary | ICD-10-CM | POA: Diagnosis not present

## 2023-12-10 DIAGNOSIS — F4489 Other dissociative and conversion disorders: Secondary | ICD-10-CM | POA: Diagnosis not present

## 2023-12-10 DIAGNOSIS — G454 Transient global amnesia: Secondary | ICD-10-CM | POA: Diagnosis not present

## 2023-12-10 DIAGNOSIS — R410 Disorientation, unspecified: Secondary | ICD-10-CM | POA: Diagnosis not present

## 2024-02-09 DIAGNOSIS — M79674 Pain in right toe(s): Secondary | ICD-10-CM | POA: Diagnosis not present

## 2024-02-09 DIAGNOSIS — M79675 Pain in left toe(s): Secondary | ICD-10-CM | POA: Diagnosis not present

## 2024-02-09 DIAGNOSIS — B351 Tinea unguium: Secondary | ICD-10-CM | POA: Diagnosis not present

## 2024-02-15 DIAGNOSIS — H44431 Hypotony of eye due to other ocular disorders, right eye: Secondary | ICD-10-CM | POA: Diagnosis not present

## 2024-02-15 DIAGNOSIS — H3581 Retinal edema: Secondary | ICD-10-CM | POA: Diagnosis not present

## 2024-02-15 DIAGNOSIS — H269 Unspecified cataract: Secondary | ICD-10-CM | POA: Diagnosis not present

## 2024-02-15 DIAGNOSIS — B49 Unspecified mycosis: Secondary | ICD-10-CM | POA: Diagnosis not present

## 2024-02-15 DIAGNOSIS — H4389 Other disorders of vitreous body: Secondary | ICD-10-CM | POA: Diagnosis not present

## 2024-02-15 DIAGNOSIS — H4419 Other endophthalmitis: Secondary | ICD-10-CM | POA: Diagnosis not present

## 2024-02-15 DIAGNOSIS — H35351 Cystoid macular degeneration, right eye: Secondary | ICD-10-CM | POA: Diagnosis not present

## 2024-05-17 DIAGNOSIS — I10 Essential (primary) hypertension: Secondary | ICD-10-CM | POA: Diagnosis not present

## 2024-05-17 DIAGNOSIS — Z95828 Presence of other vascular implants and grafts: Secondary | ICD-10-CM | POA: Diagnosis not present

## 2024-05-17 DIAGNOSIS — E785 Hyperlipidemia, unspecified: Secondary | ICD-10-CM | POA: Diagnosis not present

## 2024-05-17 DIAGNOSIS — R569 Unspecified convulsions: Secondary | ICD-10-CM | POA: Diagnosis not present

## 2024-05-17 DIAGNOSIS — Z952 Presence of prosthetic heart valve: Secondary | ICD-10-CM | POA: Diagnosis not present

## 2024-05-17 DIAGNOSIS — I9789 Other postprocedural complications and disorders of the circulatory system, not elsewhere classified: Secondary | ICD-10-CM | POA: Diagnosis not present

## 2024-05-17 DIAGNOSIS — R7303 Prediabetes: Secondary | ICD-10-CM | POA: Diagnosis not present

## 2024-05-17 DIAGNOSIS — I4891 Unspecified atrial fibrillation: Secondary | ICD-10-CM | POA: Diagnosis not present

## 2024-05-24 DIAGNOSIS — K439 Ventral hernia without obstruction or gangrene: Secondary | ICD-10-CM | POA: Diagnosis not present

## 2024-05-24 DIAGNOSIS — Z952 Presence of prosthetic heart valve: Secondary | ICD-10-CM | POA: Diagnosis not present

## 2024-05-24 DIAGNOSIS — R7303 Prediabetes: Secondary | ICD-10-CM | POA: Diagnosis not present

## 2024-05-24 DIAGNOSIS — Z95828 Presence of other vascular implants and grafts: Secondary | ICD-10-CM | POA: Diagnosis not present

## 2024-05-24 DIAGNOSIS — E785 Hyperlipidemia, unspecified: Secondary | ICD-10-CM | POA: Diagnosis not present

## 2024-05-24 DIAGNOSIS — R569 Unspecified convulsions: Secondary | ICD-10-CM | POA: Diagnosis not present

## 2024-05-24 DIAGNOSIS — I4891 Unspecified atrial fibrillation: Secondary | ICD-10-CM | POA: Diagnosis not present

## 2024-05-24 DIAGNOSIS — Z125 Encounter for screening for malignant neoplasm of prostate: Secondary | ICD-10-CM | POA: Diagnosis not present

## 2024-05-24 DIAGNOSIS — N529 Male erectile dysfunction, unspecified: Secondary | ICD-10-CM | POA: Diagnosis not present

## 2024-05-24 DIAGNOSIS — I10 Essential (primary) hypertension: Secondary | ICD-10-CM | POA: Diagnosis not present

## 2024-05-24 DIAGNOSIS — I9789 Other postprocedural complications and disorders of the circulatory system, not elsewhere classified: Secondary | ICD-10-CM | POA: Diagnosis not present

## 2024-05-24 DIAGNOSIS — N1831 Chronic kidney disease, stage 3a: Secondary | ICD-10-CM | POA: Diagnosis not present

## 2024-05-28 DIAGNOSIS — R2689 Other abnormalities of gait and mobility: Secondary | ICD-10-CM | POA: Diagnosis not present

## 2024-05-28 DIAGNOSIS — Z8673 Personal history of transient ischemic attack (TIA), and cerebral infarction without residual deficits: Secondary | ICD-10-CM | POA: Diagnosis not present

## 2024-05-28 DIAGNOSIS — R42 Dizziness and giddiness: Secondary | ICD-10-CM | POA: Diagnosis not present

## 2024-05-28 DIAGNOSIS — K432 Incisional hernia without obstruction or gangrene: Secondary | ICD-10-CM | POA: Diagnosis not present

## 2024-05-28 DIAGNOSIS — R569 Unspecified convulsions: Secondary | ICD-10-CM | POA: Diagnosis not present

## 2024-05-30 DIAGNOSIS — H35351 Cystoid macular degeneration, right eye: Secondary | ICD-10-CM | POA: Diagnosis not present

## 2024-05-30 DIAGNOSIS — H4419 Other endophthalmitis: Secondary | ICD-10-CM | POA: Diagnosis not present

## 2024-05-30 DIAGNOSIS — H4389 Other disorders of vitreous body: Secondary | ICD-10-CM | POA: Diagnosis not present

## 2024-05-30 DIAGNOSIS — B49 Unspecified mycosis: Secondary | ICD-10-CM | POA: Diagnosis not present

## 2024-05-30 DIAGNOSIS — H3581 Retinal edema: Secondary | ICD-10-CM | POA: Diagnosis not present

## 2024-05-30 DIAGNOSIS — H35341 Macular cyst, hole, or pseudohole, right eye: Secondary | ICD-10-CM | POA: Diagnosis not present

## 2024-05-30 DIAGNOSIS — H44431 Hypotony of eye due to other ocular disorders, right eye: Secondary | ICD-10-CM | POA: Diagnosis not present

## 2024-06-17 ENCOUNTER — Ambulatory Visit: Admitting: Urology

## 2024-06-25 ENCOUNTER — Ambulatory Visit: Admitting: Urology

## 2024-06-25 ENCOUNTER — Encounter: Payer: Self-pay | Admitting: Urology

## 2024-06-25 VITALS — BP 155/87 | HR 75 | Ht 72.0 in | Wt 223.9 lb

## 2024-06-25 DIAGNOSIS — N5201 Erectile dysfunction due to arterial insufficiency: Secondary | ICD-10-CM

## 2024-06-25 NOTE — Patient Instructions (Signed)
 [email or web address]

## 2024-06-25 NOTE — Progress Notes (Signed)
° °  06/25/2024 8:42 AM   [name] Jun 27, 1956 [id number]  Referring provider: Epifanio Alm SQUIBB, MD [address] [place],  [place] [address]  Chief Complaint  Patient presents with   Erectile Dysfunction    HPI: [name] is a 68 y.o. male referred for evaluation of erectile dysfunction.  2 year history of progressively worsening erectile dysfunction Sildenafil/tadalafil was initially effective.  Presently no significant partial erections and the oral medications have not been effective Significant organic risk factors including hypertension, hyperlipidemia, cerebrovascular disease, prior tobacco history (46 pack years), antihypertensive medications   PMH: Past Medical History:  Diagnosis Date   Aortic valve replaced 2023   Hypertension     Surgical History: No past surgical history on file.  Home Medications:  Allergies as of 06/25/2024   No Known Allergies      Medication List        Accurate as of June 25, 2024  8:42 AM. If you have any questions, ask your nurse or doctor.          amLODipine  10 MG tablet Commonly known as: NORVASC  Take 10 mg by mouth daily.   aspirin  EC 81 MG tablet Take 81 mg by mouth daily. Swallow whole.   clonazePAM  1 MG tablet Commonly known as: KLONOPIN  Take 2 tablets (2 mg total) by mouth once as needed (seizure lasting more than 2 minutes.).   clopidogrel  75 MG tablet Commonly known as: PLAVIX  Take 1 tablet (75 mg total) by mouth daily. Take for 21 days.   ketorolac  0.5 % ophthalmic solution Commonly known as: ACULAR  Place 1 drop into the right eye 2 (two) times daily.   levETIRAcetam  500 MG tablet Commonly known as: KEPPRA  Take 1 tablet (500 mg total) by mouth 2 (two) times daily.   losartan  25 MG tablet Commonly known as: COZAAR  Take 25 mg by mouth daily.   multivitamin with minerals tablet Take 1 tablet by mouth daily.   prednisoLONE  acetate 1 % ophthalmic suspension Commonly known as:  PRED FORTE  Place 1 drop into the right eye 6 (six) times daily. What changed: when to take this   rosuvastatin  5 MG tablet Commonly known as: CRESTOR  Take 5 mg by mouth daily.        Allergies: Allergies[1]  Family History: No family history on file.  Social History:  reports that he quit smoking about 2 years ago. His smoking use included cigarettes. He started smoking about 48 years ago. He has a 46 pack-year smoking history. He has never used smokeless tobacco. He reports current alcohol use. No history on file for drug use.   Physical Exam: BP (!) 155/87   Pulse 75   Ht 6' (1.829 m)   Wt 223 lb 14.4 oz (101.6 kg)   BMI 30.37 kg/m   Constitutional:  Alert, No acute distress. HEENT: [place] AT Respiratory: Normal respiratory effort, no increased work of breathing. Psychiatric: Normal mood and affect.   Assessment & Plan:    1.  Erectile dysfunction Significant organic risk factors PDE 5 inhibitor refractory We discussed second line options including intracavernosal injections and vacuum erection devices.  He was provided website information and literature on both.  He is not interested in pursuing intracavernosal injections Penile implant surgery was also discussed   Terry JAYSON Barba, MD  [facility] [address], [address] [place], [place] [address] [phone]    [1] No Known Allergies
